# Patient Record
Sex: Female | Born: 1945
Health system: Southern US, Community
[De-identification: ages and names within clinical notes are randomized; demographics above are authoritative.]

## PROBLEM LIST (undated history)

## (undated) DIAGNOSIS — I10 Essential (primary) hypertension: Secondary | ICD-10-CM

## (undated) DIAGNOSIS — H269 Unspecified cataract: Secondary | ICD-10-CM

## (undated) DIAGNOSIS — Z860100 Personal history of colon polyps, unspecified: Secondary | ICD-10-CM

## (undated) DIAGNOSIS — M1990 Unspecified osteoarthritis, unspecified site: Secondary | ICD-10-CM

## (undated) DIAGNOSIS — T7840XA Allergy, unspecified, initial encounter: Secondary | ICD-10-CM

## (undated) DIAGNOSIS — K5790 Diverticulosis of intestine, part unspecified, without perforation or abscess without bleeding: Secondary | ICD-10-CM

## (undated) DIAGNOSIS — S0990XA Unspecified injury of head, initial encounter: Secondary | ICD-10-CM

## (undated) DIAGNOSIS — C801 Malignant (primary) neoplasm, unspecified: Secondary | ICD-10-CM

## (undated) DIAGNOSIS — G4719 Other hypersomnia: Secondary | ICD-10-CM

## (undated) DIAGNOSIS — E079 Disorder of thyroid, unspecified: Secondary | ICD-10-CM

## (undated) DIAGNOSIS — E785 Hyperlipidemia, unspecified: Secondary | ICD-10-CM

## (undated) DIAGNOSIS — K219 Gastro-esophageal reflux disease without esophagitis: Secondary | ICD-10-CM

## (undated) DIAGNOSIS — R51 Headache: Secondary | ICD-10-CM

## (undated) HISTORY — DX: Unspecified injury of head, initial encounter: S09.90XA

## (undated) HISTORY — DX: Gastro-esophageal reflux disease without esophagitis: K21.9

## (undated) HISTORY — DX: Headache: R51

## (undated) HISTORY — PX: CATARACT EXTRACTION, BILATERAL: SHX1313

## (undated) HISTORY — DX: Diverticulosis of intestine, part unspecified, without perforation or abscess without bleeding: K57.90

## (undated) HISTORY — PX: OTHER SURGICAL HISTORY: SHX169

## (undated) HISTORY — PX: AXILLARY ABCESS IRRIGATION AND DEBRIDEMENT: SHX1210

## (undated) HISTORY — DX: Allergy, unspecified, initial encounter: T78.40XA

## (undated) HISTORY — DX: Hyperlipidemia, unspecified: E78.5

## (undated) HISTORY — DX: Unspecified cataract: H26.9

## (undated) HISTORY — DX: Disorder of thyroid, unspecified: E07.9

## (undated) HISTORY — DX: Personal history of colon polyps, unspecified: Z86.0100

## (undated) HISTORY — PX: POLYPECTOMY: SHX149

## (undated) HISTORY — DX: Personal history of colonic polyps: Z86.010

## (undated) HISTORY — DX: Essential (primary) hypertension: I10

## (undated) HISTORY — DX: Other hypersomnia: G47.19

## (undated) HISTORY — DX: Malignant (primary) neoplasm, unspecified: C80.1

## (undated) HISTORY — DX: Unspecified osteoarthritis, unspecified site: M19.90

---

## 1969-09-15 HISTORY — PX: OTHER SURGICAL HISTORY: SHX169

## 1999-06-25 ENCOUNTER — Other Ambulatory Visit: Admission: RE | Admit: 1999-06-25 | Discharge: 1999-06-25 | Payer: Self-pay | Admitting: Internal Medicine

## 1999-12-12 ENCOUNTER — Other Ambulatory Visit: Admission: RE | Admit: 1999-12-12 | Discharge: 1999-12-12 | Payer: Self-pay | Admitting: Internal Medicine

## 2000-12-14 ENCOUNTER — Other Ambulatory Visit: Admission: RE | Admit: 2000-12-14 | Discharge: 2000-12-14 | Payer: Self-pay | Admitting: Internal Medicine

## 2001-12-30 ENCOUNTER — Other Ambulatory Visit: Admission: RE | Admit: 2001-12-30 | Discharge: 2001-12-30 | Payer: Self-pay | Admitting: Internal Medicine

## 2003-05-02 ENCOUNTER — Other Ambulatory Visit: Admission: RE | Admit: 2003-05-02 | Discharge: 2003-05-02 | Payer: Self-pay | Admitting: Internal Medicine

## 2004-06-21 ENCOUNTER — Other Ambulatory Visit: Admission: RE | Admit: 2004-06-21 | Discharge: 2004-06-21 | Payer: Self-pay | Admitting: Internal Medicine

## 2004-07-04 ENCOUNTER — Encounter: Admission: RE | Admit: 2004-07-04 | Discharge: 2004-07-04 | Payer: Self-pay | Admitting: General Surgery

## 2004-07-16 ENCOUNTER — Encounter: Admission: RE | Admit: 2004-07-16 | Discharge: 2004-07-16 | Payer: Self-pay | Admitting: Neurology

## 2004-07-25 ENCOUNTER — Ambulatory Visit: Payer: Self-pay | Admitting: Internal Medicine

## 2004-08-28 ENCOUNTER — Encounter: Admission: RE | Admit: 2004-08-28 | Discharge: 2004-08-28 | Payer: Self-pay | Admitting: Internal Medicine

## 2004-09-15 HISTORY — PX: BREAST BIOPSY: SHX20

## 2004-09-17 ENCOUNTER — Encounter: Admission: RE | Admit: 2004-09-17 | Discharge: 2004-09-17 | Payer: Self-pay | Admitting: Internal Medicine

## 2004-09-26 ENCOUNTER — Ambulatory Visit: Payer: Self-pay | Admitting: Internal Medicine

## 2004-11-21 ENCOUNTER — Ambulatory Visit: Payer: Self-pay | Admitting: Internal Medicine

## 2005-02-28 ENCOUNTER — Ambulatory Visit: Payer: Self-pay | Admitting: Internal Medicine

## 2005-03-20 ENCOUNTER — Ambulatory Visit: Payer: Self-pay | Admitting: Internal Medicine

## 2005-07-08 ENCOUNTER — Ambulatory Visit: Payer: Self-pay | Admitting: Internal Medicine

## 2005-07-08 ENCOUNTER — Other Ambulatory Visit: Admission: RE | Admit: 2005-07-08 | Discharge: 2005-07-08 | Payer: Self-pay | Admitting: Neurosurgery

## 2005-07-08 ENCOUNTER — Encounter: Payer: Self-pay | Admitting: Internal Medicine

## 2005-09-11 ENCOUNTER — Encounter: Admission: RE | Admit: 2005-09-11 | Discharge: 2005-09-11 | Payer: Self-pay | Admitting: Internal Medicine

## 2006-01-13 ENCOUNTER — Ambulatory Visit: Payer: Self-pay | Admitting: Internal Medicine

## 2006-06-24 ENCOUNTER — Ambulatory Visit: Payer: Self-pay | Admitting: Internal Medicine

## 2006-06-24 LAB — CONVERTED CEMR LAB
ALT: 38 units/L (ref 0–40)
AST: 25 units/L (ref 0–37)
Albumin: 3.6 g/dL (ref 3.5–5.2)
Alkaline Phosphatase: 43 units/L (ref 39–117)
BUN: 9 mg/dL (ref 6–23)
Basophils Absolute: 0.1 10*3/uL (ref 0.0–0.1)
Basophils Relative: 0.8 % (ref 0.0–1.0)
CO2: 26 meq/L (ref 19–32)
Calcium: 8.8 mg/dL (ref 8.4–10.5)
Chloride: 103 meq/L (ref 96–112)
Chol/HDL Ratio, serum: 5.2
Cholesterol: 170 mg/dL (ref 0–200)
Creatinine, Ser: 0.9 mg/dL (ref 0.4–1.2)
Eosinophil percent: 1.9 % (ref 0.0–5.0)
GFR calc non Af Amer: 68 mL/min
Glomerular Filtration Rate, Af Am: 83 mL/min/{1.73_m2}
Glucose, Bld: 75 mg/dL (ref 70–99)
HCT: 44.7 % (ref 36.0–46.0)
HDL: 33 mg/dL — ABNORMAL LOW (ref >39.0)
Hemoglobin: 14.9 g/dL (ref 12.0–15.0)
LDL Cholesterol: 114 mg/dL — ABNORMAL HIGH (ref 0–99)
Lymphocytes Relative: 33.9 % (ref 12.0–46.0)
MCHC: 33.3 g/dL (ref 30.0–36.0)
MCV: 94.4 fL (ref 78.0–100.0)
Monocytes Absolute: 0.5 10*3/uL (ref 0.2–0.7)
Monocytes Relative: 5.4 % (ref 3.0–11.0)
Neutro Abs: 5.1 10*3/uL (ref 1.4–7.7)
Neutrophils Relative %: 58 % (ref 43.0–77.0)
Platelets: 346 10*3/uL (ref 150–400)
Potassium: 3.6 meq/L (ref 3.5–5.1)
RBC: 4.73 M/uL (ref 3.87–5.11)
RDW: 12.9 % (ref 11.5–14.6)
Sodium: 138 meq/L (ref 135–145)
TSH: 3.62 microintl units/mL (ref 0.35–5.50)
Total Bilirubin: 0.9 mg/dL (ref 0.3–1.2)
Total Protein: 6.6 g/dL (ref 6.0–8.3)
Triglyceride fasting, serum: 116 mg/dL (ref 0–149)
VLDL: 23 mg/dL (ref 0–40)
WBC: 8.9 10*3/uL (ref 4.5–10.5)

## 2006-06-29 ENCOUNTER — Encounter: Payer: Self-pay | Admitting: Internal Medicine

## 2006-06-29 ENCOUNTER — Other Ambulatory Visit: Admission: RE | Admit: 2006-06-29 | Discharge: 2006-06-29 | Payer: Self-pay | Admitting: Internal Medicine

## 2006-06-29 ENCOUNTER — Ambulatory Visit: Payer: Self-pay | Admitting: Internal Medicine

## 2006-08-04 ENCOUNTER — Ambulatory Visit: Payer: Self-pay | Admitting: Gastroenterology

## 2006-08-17 ENCOUNTER — Encounter: Payer: Self-pay | Admitting: Internal Medicine

## 2006-08-17 ENCOUNTER — Ambulatory Visit: Payer: Self-pay | Admitting: Gastroenterology

## 2006-08-17 ENCOUNTER — Encounter (INDEPENDENT_AMBULATORY_CARE_PROVIDER_SITE_OTHER): Payer: Self-pay | Admitting: Specialist

## 2006-08-17 HISTORY — PX: COLONOSCOPY: SHX174

## 2006-08-31 ENCOUNTER — Ambulatory Visit: Payer: Self-pay | Admitting: Internal Medicine

## 2006-12-23 ENCOUNTER — Encounter: Admission: RE | Admit: 2006-12-23 | Discharge: 2006-12-23 | Payer: Self-pay | Admitting: Internal Medicine

## 2007-04-12 DIAGNOSIS — R51 Headache: Secondary | ICD-10-CM | POA: Insufficient documentation

## 2007-04-12 DIAGNOSIS — M1990 Unspecified osteoarthritis, unspecified site: Secondary | ICD-10-CM | POA: Insufficient documentation

## 2007-04-12 DIAGNOSIS — R519 Headache, unspecified: Secondary | ICD-10-CM | POA: Insufficient documentation

## 2007-04-12 DIAGNOSIS — E039 Hypothyroidism, unspecified: Secondary | ICD-10-CM | POA: Insufficient documentation

## 2007-05-03 ENCOUNTER — Ambulatory Visit: Payer: Self-pay | Admitting: Internal Medicine

## 2007-05-03 DIAGNOSIS — E785 Hyperlipidemia, unspecified: Secondary | ICD-10-CM | POA: Insufficient documentation

## 2007-05-03 DIAGNOSIS — I10 Essential (primary) hypertension: Secondary | ICD-10-CM | POA: Insufficient documentation

## 2007-08-30 ENCOUNTER — Telehealth: Payer: Self-pay | Admitting: Internal Medicine

## 2007-08-31 ENCOUNTER — Ambulatory Visit: Payer: Self-pay | Admitting: Internal Medicine

## 2007-09-27 ENCOUNTER — Other Ambulatory Visit: Admission: RE | Admit: 2007-09-27 | Discharge: 2007-09-27 | Payer: Self-pay | Admitting: Internal Medicine

## 2007-09-27 ENCOUNTER — Encounter: Payer: Self-pay | Admitting: Internal Medicine

## 2007-09-27 ENCOUNTER — Ambulatory Visit: Payer: Self-pay | Admitting: Internal Medicine

## 2007-09-27 DIAGNOSIS — K5730 Diverticulosis of large intestine without perforation or abscess without bleeding: Secondary | ICD-10-CM | POA: Insufficient documentation

## 2007-09-27 DIAGNOSIS — Z860100 Personal history of colon polyps, unspecified: Secondary | ICD-10-CM | POA: Insufficient documentation

## 2007-09-27 LAB — CONVERTED CEMR LAB
Cholesterol, target level: 200 mg/dL
HDL goal, serum: 40 mg/dL
LDL Goal: 100 mg/dL

## 2007-10-01 ENCOUNTER — Telehealth: Payer: Self-pay | Admitting: *Deleted

## 2007-12-22 ENCOUNTER — Telehealth: Payer: Self-pay | Admitting: *Deleted

## 2007-12-22 ENCOUNTER — Ambulatory Visit: Payer: Self-pay | Admitting: Internal Medicine

## 2007-12-22 LAB — CONVERTED CEMR LAB
Cholesterol: 178 mg/dL (ref 0–200)
HDL: 37.7 mg/dL — ABNORMAL LOW (ref >39.0)
LDL Cholesterol: 121 mg/dL — ABNORMAL HIGH (ref 0–99)
TSH: 1.82 microintl units/mL (ref 0.35–5.50)
Total CHOL/HDL Ratio: 4.7
Triglycerides: 95 mg/dL (ref 0–149)
VLDL: 19 mg/dL (ref 0–40)

## 2007-12-31 ENCOUNTER — Ambulatory Visit: Payer: Self-pay | Admitting: Internal Medicine

## 2008-02-10 ENCOUNTER — Encounter: Admission: RE | Admit: 2008-02-10 | Discharge: 2008-02-10 | Payer: Self-pay | Admitting: Internal Medicine

## 2008-04-05 ENCOUNTER — Ambulatory Visit: Payer: Self-pay | Admitting: Internal Medicine

## 2008-04-05 DIAGNOSIS — T887XXA Unspecified adverse effect of drug or medicament, initial encounter: Secondary | ICD-10-CM | POA: Insufficient documentation

## 2008-04-05 LAB — CONVERTED CEMR LAB
ALT: 53 units/L — ABNORMAL HIGH (ref 0–35)
AST: 36 units/L (ref 0–37)
Albumin: 3.6 g/dL (ref 3.5–5.2)
Alkaline Phosphatase: 48 units/L (ref 39–117)
BUN: 8 mg/dL (ref 6–23)
Bilirubin, Direct: 0.1 mg/dL (ref 0.0–0.3)
CO2: 27 meq/L (ref 19–32)
Calcium: 9 mg/dL (ref 8.4–10.5)
Chloride: 108 meq/L (ref 96–112)
Cholesterol: 156 mg/dL (ref 0–200)
Creatinine, Ser: 0.8 mg/dL (ref 0.4–1.2)
GFR calc Af Amer: 94 mL/min
GFR calc non Af Amer: 78 mL/min
Glucose, Bld: 90 mg/dL (ref 70–99)
HDL: 33.1 mg/dL — ABNORMAL LOW (ref >39.0)
LDL Cholesterol: 105 mg/dL — ABNORMAL HIGH (ref 0–99)
Potassium: 4.9 meq/L (ref 3.5–5.1)
Sodium: 140 meq/L (ref 135–145)
TSH: 1.77 microintl units/mL (ref 0.35–5.50)
Total Bilirubin: 0.8 mg/dL (ref 0.3–1.2)
Total CHOL/HDL Ratio: 4.7
Total Protein: 6.9 g/dL (ref 6.0–8.3)
Triglycerides: 92 mg/dL (ref 0–149)
VLDL: 18 mg/dL (ref 0–40)

## 2008-06-27 ENCOUNTER — Ambulatory Visit: Payer: Self-pay | Admitting: Internal Medicine

## 2008-11-16 ENCOUNTER — Telehealth: Payer: Self-pay | Admitting: Internal Medicine

## 2009-06-21 ENCOUNTER — Ambulatory Visit: Payer: Self-pay | Admitting: Internal Medicine

## 2009-08-14 ENCOUNTER — Encounter: Admission: RE | Admit: 2009-08-14 | Discharge: 2009-08-14 | Payer: Self-pay | Admitting: Internal Medicine

## 2010-03-20 ENCOUNTER — Ambulatory Visit: Payer: Self-pay | Admitting: Internal Medicine

## 2010-03-20 LAB — CONVERTED CEMR LAB
ALT: 33 units/L (ref 0–35)
AST: 27 units/L (ref 0–37)
Albumin: 4 g/dL (ref 3.5–5.2)
Alkaline Phosphatase: 57 units/L (ref 39–117)
BUN: 11 mg/dL (ref 6–23)
Basophils Absolute: 0 10*3/uL (ref 0.0–0.1)
Basophils Relative: 0.3 % (ref 0.0–3.0)
Bilirubin, Direct: 0.1 mg/dL (ref 0.0–0.3)
Blood in Urine, dipstick: NEGATIVE
CO2: 27 meq/L (ref 19–32)
Calcium: 9.1 mg/dL (ref 8.4–10.5)
Chloride: 108 meq/L (ref 96–112)
Cholesterol: 158 mg/dL (ref 0–200)
Creatinine, Ser: 0.9 mg/dL (ref 0.4–1.2)
Eosinophils Absolute: 0.1 10*3/uL (ref 0.0–0.7)
Eosinophils Relative: 1.4 % (ref 0.0–5.0)
GFR calc non Af Amer: 69.11 mL/min (ref >60)
Glucose, Bld: 81 mg/dL (ref 70–99)
Glucose, Urine, Semiquant: NEGATIVE
HCT: 43.5 % (ref 36.0–46.0)
HDL: 41 mg/dL (ref >39.00)
Hemoglobin: 15.1 g/dL — ABNORMAL HIGH (ref 12.0–15.0)
LDL Cholesterol: 99 mg/dL (ref 0–99)
Lymphocytes Relative: 26.9 % (ref 12.0–46.0)
Lymphs Abs: 2.4 10*3/uL (ref 0.7–4.0)
MCHC: 34.5 g/dL (ref 30.0–36.0)
MCV: 94 fL (ref 78.0–100.0)
Monocytes Absolute: 0.4 10*3/uL (ref 0.1–1.0)
Monocytes Relative: 5 % (ref 3.0–12.0)
Neutro Abs: 6 10*3/uL (ref 1.4–7.7)
Neutrophils Relative %: 66.4 % (ref 43.0–77.0)
Nitrite: NEGATIVE
Platelets: 301 10*3/uL (ref 150.0–400.0)
Potassium: 4.6 meq/L (ref 3.5–5.1)
RBC: 4.63 M/uL (ref 3.87–5.11)
RDW: 14.5 % (ref 11.5–14.6)
Sodium: 143 meq/L (ref 135–145)
Specific Gravity, Urine: 1.025
TSH: 2.18 microintl units/mL (ref 0.35–5.50)
Total Bilirubin: 0.6 mg/dL (ref 0.3–1.2)
Total CHOL/HDL Ratio: 4
Total Protein: 7 g/dL (ref 6.0–8.3)
Triglycerides: 92 mg/dL (ref 0.0–149.0)
Urobilinogen, UA: 1
VLDL: 18.4 mg/dL (ref 0.0–40.0)
WBC Urine, dipstick: NEGATIVE
WBC: 9 10*3/uL (ref 4.5–10.5)
pH: 7

## 2010-03-27 ENCOUNTER — Ambulatory Visit: Payer: Self-pay | Admitting: Internal Medicine

## 2010-03-27 DIAGNOSIS — IMO0002 Reserved for concepts with insufficient information to code with codable children: Secondary | ICD-10-CM | POA: Insufficient documentation

## 2010-04-02 ENCOUNTER — Ambulatory Visit: Payer: Self-pay | Admitting: Internal Medicine

## 2010-04-02 ENCOUNTER — Telehealth: Payer: Self-pay | Admitting: Internal Medicine

## 2010-04-09 ENCOUNTER — Encounter: Payer: Self-pay | Admitting: Internal Medicine

## 2010-04-09 ENCOUNTER — Encounter: Admission: RE | Admit: 2010-04-09 | Discharge: 2010-06-14 | Payer: Self-pay | Admitting: Internal Medicine

## 2010-06-03 ENCOUNTER — Telehealth: Payer: Self-pay | Admitting: Internal Medicine

## 2010-06-21 ENCOUNTER — Ambulatory Visit: Payer: Self-pay | Admitting: Internal Medicine

## 2010-06-21 DIAGNOSIS — T783XXA Angioneurotic edema, initial encounter: Secondary | ICD-10-CM | POA: Insufficient documentation

## 2010-06-26 LAB — CONVERTED CEMR LAB
Basophils Absolute: 0 10*3/uL (ref 0.0–0.1)
Basophils Relative: 0 % (ref 0–1)
Eosinophils Absolute: 0.1 10*3/uL (ref 0.0–0.7)
Eosinophils Relative: 1 % (ref 0–5)
HCT: 43 % (ref 36.0–46.0)
Hemoglobin: 14.4 g/dL (ref 12.0–15.0)
IgE (Immunoglobulin E), Serum: 76.3 intl units/mL (ref 0.0–180.0)
Lymphocytes Relative: 25 % (ref 12–46)
Lymphs Abs: 2.6 10*3/uL (ref 0.7–4.0)
MCHC: 33.5 g/dL (ref 30.0–36.0)
MCV: 92.5 fL (ref 78.0–100.0)
Monocytes Absolute: 0.6 10*3/uL (ref 0.1–1.0)
Monocytes Relative: 6 % (ref 3–12)
Neutro Abs: 7 10*3/uL (ref 1.7–7.7)
Neutrophils Relative %: 68 % (ref 43–77)
Platelets: 342 10*3/uL (ref 150–400)
RBC: 4.65 M/uL (ref 3.87–5.11)
RDW: 13.8 % (ref 11.5–15.5)
WBC: 10.3 10*3/uL (ref 4.0–10.5)

## 2010-07-31 ENCOUNTER — Ambulatory Visit: Payer: Self-pay | Admitting: Internal Medicine

## 2010-07-31 LAB — CONVERTED CEMR LAB
BUN: 14 mg/dL (ref 6–23)
CO2: 26 meq/L (ref 19–32)
Calcium: 8.9 mg/dL (ref 8.4–10.5)
Chloride: 103 meq/L (ref 96–112)
Cholesterol: 184 mg/dL (ref 0–200)
Creatinine, Ser: 0.8 mg/dL (ref 0.4–1.2)
Direct LDL: 128 mg/dL
GFR calc non Af Amer: 81.75 mL/min (ref >60)
Glucose, Bld: 83 mg/dL (ref 70–99)
HDL: 43.1 mg/dL (ref >39.00)
IgE (Immunoglobulin E), Serum: 77.1 intl units/mL (ref 0.0–180.0)
Potassium: 4.1 meq/L (ref 3.5–5.1)
Sodium: 138 meq/L (ref 135–145)
TSH: 2.63 microintl units/mL (ref 0.35–5.50)

## 2010-08-12 ENCOUNTER — Telehealth: Payer: Self-pay | Admitting: Internal Medicine

## 2010-08-15 ENCOUNTER — Encounter: Admission: RE | Admit: 2010-08-15 | Discharge: 2010-08-15 | Payer: Self-pay | Admitting: Internal Medicine

## 2010-08-21 ENCOUNTER — Ambulatory Visit: Payer: Self-pay | Admitting: Internal Medicine

## 2010-08-21 ENCOUNTER — Encounter: Payer: Self-pay | Admitting: Internal Medicine

## 2010-08-21 LAB — CONVERTED CEMR LAB
Compl, Total (CH50): 40 (ref 31–60)
Complement C4, Body Fluid: 36 mg/dL (ref 16–47)
Sed Rate: 6 mm/hr (ref 0–22)

## 2010-09-03 ENCOUNTER — Ambulatory Visit: Payer: Self-pay | Admitting: Internal Medicine

## 2010-09-03 DIAGNOSIS — K219 Gastro-esophageal reflux disease without esophagitis: Secondary | ICD-10-CM | POA: Insufficient documentation

## 2010-09-20 ENCOUNTER — Ambulatory Visit: Admit: 2010-09-20 | Payer: Self-pay | Admitting: Internal Medicine

## 2010-09-27 ENCOUNTER — Ambulatory Visit: Admit: 2010-09-27 | Payer: Self-pay | Admitting: Internal Medicine

## 2010-10-05 ENCOUNTER — Encounter: Payer: Self-pay | Admitting: Internal Medicine

## 2010-10-06 ENCOUNTER — Encounter: Payer: Self-pay | Admitting: Internal Medicine

## 2010-10-15 NOTE — Miscellaneous (Signed)
Summary: Waiver of Liability for [employer]  Waiver of Liability for [employer]   Imported By: Maryln Gottron 02/10/2008 12:34:14  _____________________________________________________________________  External Attachment:    Type:   Image     Comment:   External Document

## 2010-10-15 NOTE — Progress Notes (Signed)
Summary: referral  Phone Note Call from Patient Call back at Home Phone [phone]   Caller: Patient Call For: Stacie Glaze MD Summary of Call: Pt is complaining of allergies, and wantsr to make an appt with allergist before the first of the year.   Initial call taken by: Lynann Beaver CMA AAMA,  August 12, 2010 12:12 PM  Follow-up for Phone Call        referral to dr Maple Hudson  if not possible dr Edenborn Callas Follow-up by: Stacie Glaze MD,  August 12, 2010 1:25 PM

## 2010-10-15 NOTE — Assessment & Plan Note (Signed)
Summary: flu shot/njr  Nurse Visit   Allergies: 1)  ! Daypro 2)  ! Lamisil At (Terbinafine Hcl)  Orders Added: 1)  Admin 1st Vaccine [90471] 2)  Flu Vaccine 59yrs + [42706] Flu Vaccine Consent Questions     Do you have a history of severe allergic reactions to this vaccine? no    Any prior history of allergic reactions to egg and/or gelatin? no    Do you have a sensitivity to the preservative Thimersol? no    Do you have a past history of Guillan-Barre Syndrome? no    Do you currently have an acute febrile illness? no    Have you ever had a severe reaction to latex? no    Vaccine information given and explained to patient? yes    Are you currently pregnant? no    Lot Number:[id number]   Exp Date:03/14/2010   Site Given  Right Deltoid IMu Vaccine 4yrs + [id number] .lbflu

## 2010-10-15 NOTE — Progress Notes (Signed)
Summary: results  Phone Note From Pharmacy Call back at [facility] Phone [phone] Call back at [phone]   Caller: vm Reason for Call: Lab or Test Results Summary of Call: Xrays & whether MRI or ov needed Initial call taken by: Rudy Jew, RN,  April 02, 2010 3:42 PM  Follow-up for Phone Call        scoliosis and moderate arthritis refer to PT Follow-up by: Stacie Glaze MD,  April 03, 2010 12:24 PM  Additional Follow-up for Phone Call Additional follow up Details #1::        Left message to check H# for message & left complete message there.   Additional Follow-up by: Rudy Jew, RN,  April 03, 2010 1:12 PM    Prescriptions: LIPITOR 10 MG  TABS (ATORVASTATIN CALCIUM) 1/2 tablet once daily  #50 x 0   Entered by:   Willy Eddy, LPN   Authorized by:   Stacie Glaze MD   Signed by:   Willy Eddy, LPN on 69/62/9528   Method used:   Telephoned to ...         RxID:   [id number]

## 2010-10-15 NOTE — Assessment & Plan Note (Signed)
Summary: CPX WITH PAP/MHF   Vital Signs:  Patient Profile:   65 Years Old Female Height:     65 inches Weight:      177 pounds Temp:     97.6 degrees F oral Pulse rate:   78 / minute Pulse rhythm:   regular Resp:     16 per minute BP sitting:   142 / 82  Vitals Entered By: Lynann Beaver CMA (September 27, 2007 2:10 PM)                 Chief Complaint:  well woman physical.  History of Present Illness: this is a pap smear and a follow up of HTN, lipids and migraine headaches   Hypertension History:      She denies headache, chest pain, palpitations, dyspnea with exertion, orthopnea, PND, peripheral edema, visual symptoms, neurologic problems, syncope, and side effects from treatment.        Positive major cardiovascular risk factors include female age 32 years old or older, hyperlipidemia, and hypertension.  Negative major cardiovascular risk factors include no history of diabetes, negative family history for ischemic heart disease, and non-tobacco-user status.        Further assessment for target organ damage reveals no history of ASHD, stroke/TIA, or peripheral vascular disease.    Lipid Management History:      Positive NCEP/ATP III risk factors include female age 9 years old or older, HDL cholesterol less than 40, and hypertension.  Negative NCEP/ATP III risk factors include non-diabetic, no family history for ischemic heart disease, non-tobacco-user status, no ASHD (atherosclerotic heart disease), no prior stroke/TIA, no peripheral vascular disease, and no history of aortic aneurysm.      Current Allergies (reviewed today): ! DAYPRO ! LAMISIL AT (TERBINAFINE HCL) Updated/Current Medications (including changes made in today's visit):  LEVOTHYROXINE SODIUM 75 MCG  TABS (LEVOTHYROXINE SODIUM) Take 1 tablet by mouth once a day LIPITOR 10 MG  TABS (ATORVASTATIN CALCIUM) 1/2 tablet once daily ACIPHEX 20 MG  TBEC (RABEPRAZOLE SODIUM) onepo daily ALTACE 5 MG  CAPS (RAMIPRIL)  Take 1 tablet by mouth once a day ZYRTEC ALLERGY 10 MG  TABS (CETIRIZINE HCL) as needed * CALCIUM once daily * MULTIVITAMIN once daily RELPAX 40 MG  TABS (ELETRIPTAN HYDROBROMIDE) one by mouth as needed migraine HA LYBREL 90-20 MCG  TABS (LEVONORGESTREL-ETHINYL ESTRAD) one daily for 28 days and begin new pack   Past Medical History:    Reviewed history from 05/03/2007 and no changes required:       Headache       Hypothyroidism       Osteoarthritis       Hypertension       Hyperlipidemia       Colonic polyps, hx of       Diverticulosis, colon  Past Surgical History:    Reviewed history from 05/03/2007 and no changes required:       Colonoscopy-08/17/2006       BCCA from eyelid   Family History:    Reviewed history from 05/03/2007 and no changes required:       father died fro liver dz       Family History of Alcoholism/Addiction       mother lung cancer 66       Family History Lung cancer  Social History:    Reviewed history from 05/03/2007 and no changes required:       Never Smoked       Single   Risk Factors:  Colonoscopy History:     Date of Last Colonoscopy:  08/17/2006    Results:  Abnormal    Review of Systems  The patient denies anorexia, fever, weight loss, vision loss, decreased hearing, hoarseness, chest pain, syncope, dyspnea on exhertion, peripheral edema, prolonged cough, hemoptysis, abdominal pain, melena, hematochezia, severe indigestion/heartburn, hematuria, incontinence, genital sores, muscle weakness, suspicious skin lesions, transient blindness, difficulty walking, depression, unusual weight change, abnormal bleeding, enlarged lymph nodes, angioedema, and breast masses.     Physical Exam  General:     Well-developed,well-nourished,in no acute distress; alert,appropriate and cooperative throughout examination Head:     Normocephalic and atraumatic without obvious abnormalities. No apparent alopecia or balding. Eyes:     No corneal or  conjunctival inflammation noted. EOMI. Perrla. Funduscopic exam benign, without hemorrhages, exudates or papilledema. Vision grossly normal. Nose:     External nasal examination shows no deformity or inflammation. Nasal mucosa are pink and moist without lesions or exudates. Mouth:     Oral mucosa and oropharynx without lesions or exudates.  Teeth in good repair. Neck:     No deformities, masses, or tenderness noted. Breasts:     skin/areolae normal, no masses, and no abnormal thickening.   Lungs:     Normal respiratory effort, chest expands symmetrically. Lungs are clear to auscultation, no crackles or wheezes. Heart:     Normal rate and regular rhythm. S1 and S2 normal without gallop, murmur, click, rub or other extra sounds. Abdomen:     Bowel sounds positive,abdomen soft and non-tender without masses, organomegaly or hernias noted. Rectal:     no external abnormalities.   Genitalia:     normal introitus, no vaginal atrophy, and no friaility or hemorrhage.   Msk:     No deformity or scoliosis noted of thoracic or lumbar spine.   Pulses:     R and L carotid,radial,femoral,dorsalis pedis and posterior tibial pulses are full and equal bilaterally Extremities:     No clubbing, cyanosis, edema, or deformity noted with normal full range of motion of all joints.   Neurologic:     No cranial nerve deficits noted. Station and gait are normal. Plantar reflexes are down-going bilaterally. DTRs are symmetrical throughout. Sensory, motor and coordinative functions appear intact. Skin:     Intact without suspicious lesions or rashes Cervical Nodes:     No lymphadenopathy noted Axillary Nodes:     No palpable lymphadenopathy Psych:     Oriented X3 and memory intact for recent and remote.      Impression & Recommendations:  Problem # 1:  HYPERLIPIDEMIA (ICD-272.4) Assessment: Unchanged  Her updated medication list for this problem includes:    Lipitor 10 Mg Tabs (Atorvastatin calcium)  .Marland Kitchen... 1/2 tablet once daily  Labs Reviewed: Chol: 170 (06/24/2006)   HDL: 33.0 (06/24/2006)   LDL: 114 CALC (06/24/2006)   TG: 116 (06/24/2006) SGOT: 25 (06/24/2006)   SGPT: 38 (06/24/2006)  Lipid Goals: Chol Goal: 200 (09/27/2007)   HDL Goal: 40 (09/27/2007)   LDL Goal: 100 (09/27/2007)   TG Goal: 150 (09/27/2007)  10 Yr Risk Heart Disease: 24 %   Problem # 2:  HYPERTENSION (ICD-401.9) Assessment: Unchanged  Her updated medication list for this problem includes:    Altace 5 Mg Caps (Ramipril) .Marland Kitchen... Take 1 tablet by mouth once a day  BP today: 142/82 Prior BP: 136/80 (05/03/2007)  10 Yr Risk Heart Disease: 24 %  Labs Reviewed: Creat: 0.9 (06/24/2006) Chol: 170 (06/24/2006)   HDL: 33.0 (06/24/2006)  LDL: 114 CALC (06/24/2006)   TG: 116 (06/24/2006)   Problem # 3:  OSTEOARTHRITIS (ICD-715.90) Assessment: Unchanged Discussed use of medications, application of heat or cold, and exercises.   Problem # 4:  HYPOTHYROIDISM (ICD-244.9)  Her updated medication list for this problem includes:    Levothyroxine Sodium 75 Mcg Tabs (Levothyroxine sodium) .Marland Kitchen... Take 1 tablet by mouth once a day  Labs Reviewed: TSH: 3.62 (06/24/2006)    Chol: 170 (06/24/2006)   HDL: 33.0 (06/24/2006)   LDL: 114 CALC (06/24/2006)   TG: 116 (06/24/2006)   Complete Medication List: 1)  Levothyroxine Sodium 75 Mcg Tabs (Levothyroxine sodium) .... Take 1 tablet by mouth once a day 2)  Lipitor 10 Mg Tabs (Atorvastatin calcium) .... 1/2 tablet once daily 3)  Aciphex 20 Mg Tbec (Rabeprazole sodium) .... Onepo daily 4)  Altace 5 Mg Caps (Ramipril) .... Take 1 tablet by mouth once a day 5)  Zyrtec Allergy 10 Mg Tabs (Cetirizine hcl) .... As needed 6)  Calcium  .... Once daily 7)  Multivitamin  .... Once daily 8)  Relpax 40 Mg Tabs (Eletriptan hydrobromide) .... One by mouth as needed migraine ha 9)  Lybrel 90-20 Mcg Tabs (Levonorgestrel-ethinyl estrad) .... One daily for 28 days and begin new  pack  Hypertension Assessment/Plan:      The patient's hypertensive risk group is category B: At least one risk factor (excluding diabetes) with no target organ damage.  Her calculated 10 year risk of coronary heart disease is 24 %.  Today's blood pressure is 142/82.  Her blood pressure goal is < 140/90.  Lipid Assessment/Plan:      Based on NCEP/ATP III, the patient's risk factor category is "2 or more risk factors and a calculated 10 year CAD risk of > 20%".  From this information, the patient's calculated lipid goals are as follows: Total cholesterol goal is 200; LDL cholesterol goal is 100; HDL cholesterol goal is 40; Triglyceride goal is 150.     Patient Instructions: 1)  Please schedule a follow-up appointment in 3 months. 2)  Lipid Panel prior to visit, ICD-9: 272.4 3)  TSH prior to visit, ICD-9:  244.9 4)  Hepatic Panel prior to visit, ICD-9:  995.20    Prescriptions: LYBREL 90-20 MCG  TABS (LEVONORGESTREL-ETHINYL ESTRAD) one daily for 28 days and begin new pack  #3 x 3   Entered and Authorized by:   Stacie Glaze MD   Signed by:   Stacie Glaze MD on 09/27/2007   Method used:   Print then Give to Patient   RxID:   [id number] ALTACE 5 MG  CAPS (RAMIPRIL) Take 1 tablet by mouth once a day  #90 x 3   Entered and Authorized by:   Stacie Glaze MD   Signed by:   Stacie Glaze MD on 09/27/2007   Method used:   Print then Give to Patient   RxID:   [id number]  ]

## 2010-10-15 NOTE — Progress Notes (Signed)
Summary: Mail Order Rx Request  Phone Note Call from Patient Call back at Home Phone [phone]   Caller: Patient Call For: Lovell Sheehan Reason for Call: Refill Medication Summary of Call: Patient needs a mail order prescription for Levothyroxine 75 micrograms 90 day prescription. [facility] is the pharmacy Initial call taken by: Barnie Mort,  December 22, 2007 9:34 AM      Prescriptions: LEVOTHYROXINE SODIUM 75 MCG  TABS (LEVOTHYROXINE SODIUM) Take 1 tablet by mouth once a day  #90 x 3   Entered by:   Willy Eddy, LPN   Authorized by:   Stacie Glaze MD   Signed by:   Willy Eddy, LPN on 09/81/1914   Method used:   Print then Give to Patient   RxID:   [id number]

## 2010-10-15 NOTE — Assessment & Plan Note (Signed)
Summary: multiple episodes of allergic reactions/dm   Vital Signs:  Patient profile:   65 year old female Height:      65 inches Weight:      172 pounds BMI:     28.73 Temp:     98.2 degrees F oral Pulse rate:   76 / minute Resp:     14 per minute BP sitting:   122 / 80  (left arm)  Vitals Entered By: Willy Eddy, LPN (June 21, 2010 4:10 PM) CC: c/o several episodes of allergic reactions with swollen lips and tongue-unknown cause--reloeved with benadryl Is Patient Diabetic? No   Primary Care Provider:  Stacie Glaze MD  CC:  c/o several episodes of allergic reactions with swollen lips and tongue-unknown cause--reloeved with benadryl.  History of Present Illness: flu shot given has been having episodic swelling in joints that responds to heat ( warm water on joints) has noted togue swelling in the night has noted this  happening more often lips swollen sometime unilateral palmar erythema off an on for a while  but more often recently has not changes diet of makeup or facial no new pets did not respond to benadryl  Preventive Screening-Counseling & Management  Alcohol-Tobacco     Smoking Status: never     Passive Smoke Exposure: no  Problems Prior to Update: 1)  Preventive Health Care  (ICD-V70.0) 2)  Lumbar Radiculopathy, Right  (ICD-724.4) 3)  Uns Advrs Eff Uns Rx Medicinal&biological Sbstnc  (ICD-995.20) 4)  Diverticulosis, Colon  (ICD-562.10) 5)  Colonic Polyps, Hx of  (ICD-V12.72) 6)  Family History of Alcoholism/addiction  (ICD-V61.41) 7)  Hyperlipidemia  (ICD-272.4) 8)  Hypertension  (ICD-401.9) 9)  Osteoarthritis  (ICD-715.90) 10)  Hypothyroidism  (ICD-244.9) 11)  Headache  (ICD-784.0)  Current Problems (verified): 1)  Preventive Health Care  (ICD-V70.0) 2)  Lumbar Radiculopathy, Right  (ICD-724.4) 3)  Uns Advrs Eff Uns Rx Medicinal&biological Sbstnc  (ICD-995.20) 4)  Diverticulosis, Colon  (ICD-562.10) 5)  Colonic Polyps, Hx of   (ICD-V12.72) 6)  Family History of Alcoholism/addiction  (ICD-V61.41) 7)  Hyperlipidemia  (ICD-272.4) 8)  Hypertension  (ICD-401.9) 9)  Osteoarthritis  (ICD-715.90) 10)  Hypothyroidism  (ICD-244.9) 11)  Headache  (ICD-784.0)  Medications Prior to Update: 1)  Levothyroxine Sodium 75 Mcg  Tabs (Levothyroxine Sodium) .... Take 1 Tablet By Mouth Once A Day 2)  Lipitor 10 Mg  Tabs (Atorvastatin Calcium) .... 1/2 Tablet Once Daily 3)  Aciphex 20 Mg  Tbec (Rabeprazole Sodium) .... Onepo Daily 4)  Altace 5 Mg  Caps (Ramipril) .... Take 1 Tablet By Mouth Once A Day 5)  Calcium .... Once Daily 6)  Multivitamin .... Once Daily 7)  Relpax 40 Mg  Tabs (Eletriptan Hydrobromide) .... One By Mouth As Needed Migraine Ha 8)  Lybrel 90-20 Mcg  Tabs (Levonorgestrel-Ethinyl Estrad) .... One Daily For 28 Days and Begin New Pack 9)  Advil 200 Mg Tabs (Ibuprofen) .... As Needed 10)  Maalox Plus 225-200-25 Mg/1ml Susp (Alum & Mag Hydroxide-Simeth) .... As Needed  Current Medications (verified): 1)  Levothyroxine Sodium 75 Mcg  Tabs (Levothyroxine Sodium) .... Take 1 Tablet By Mouth Once A Day 2)  Lipitor 10 Mg  Tabs (Atorvastatin Calcium) .... 1/2 Tablet Once Daily 3)  Aciphex 20 Mg  Tbec (Rabeprazole Sodium) .... Onepo Daily 4)  Altace 5 Mg  Caps (Ramipril) .... Take 1 Tablet By Mouth Once A Day 5)  Calcium .... Once Daily 6)  Multivitamin .... Once Daily 7)  Relpax 40 Mg  Tabs (Eletriptan Hydrobromide) .... One By Mouth As Needed Migraine Ha 8)  Lybrel 90-20 Mcg  Tabs (Levonorgestrel-Ethinyl Estrad) .... One Daily For 28 Days and Begin New Pack 9)  Advil 200 Mg Tabs (Ibuprofen) .... As Needed 10)  Maalox Plus 225-200-25 Mg/53ml Susp (Alum & Mag Hydroxide-Simeth) .... As Needed 11)  Benadryl 25 Mg Tabs (Diphenhydramine Hcl) .Marland Kitchen.. 1 Once Daily As Needed 12)  Prednisone 20 Mg Tabs (Prednisone) .... Take Two When The Swelling Occurs  Allergies (verified): 1)  ! Daypro 2)  ! Lamisil At (Terbinafine  Hcl)  Past History:  Family History: Last updated: 05/21/07 father died fro liver dz Family History of Alcoholism/Addiction mother lung cancer 27 Family History Lung cancer  Social History: Last updated: 2007-05-21 Never Smoked Single  Risk Factors: Caffeine Use: 1 (2007-05-21) Exercise: no (05/21/2007)  Risk Factors: Smoking Status: never (06/21/2010) Passive Smoke Exposure: no (06/21/2010)  Past medical, surgical, family and social histories (including risk factors) reviewed, and no changes noted (except as noted below).  Past Medical History: Reviewed history from 09/27/2007 and no changes required. Headache Hypothyroidism Osteoarthritis Hypertension Hyperlipidemia Colonic polyps, hx of Diverticulosis, colon  Past Surgical History: Reviewed history from 2007/05/21 and no changes required. Colonoscopy-08/17/2006 BCCA from eyelid  Family History: Reviewed history from 05-21-2007 and no changes required. father died fro liver dz Family History of Alcoholism/Addiction mother lung cancer 24 Family History Lung cancer  Social History: Reviewed history from 05/21/07 and no changes required. Never Smoked Single  Review of Systems  The patient denies anorexia, fever, weight loss, weight gain, vision loss, decreased hearing, hoarseness, chest pain, syncope, dyspnea on exertion, peripheral edema, prolonged cough, headaches, hemoptysis, abdominal pain, melena, hematochezia, severe indigestion/heartburn, hematuria, incontinence, genital sores, muscle weakness, suspicious skin lesions, transient blindness, difficulty walking, depression, unusual weight change, abnormal bleeding, enlarged lymph nodes, angioedema, and breast masses.    Physical Exam  General:  Well-developed,well-nourished,in no acute distress; alert,appropriate and cooperative throughout examination Head:  Normocephalic and atraumatic without obvious abnormalities. No apparent alopecia or  balding. Eyes:  No corneal or conjunctival inflammation noted. EOMI. Perrla. Funduscopic exam benign, without hemorrhages, exudates or papilledema. Vision grossly normal. Ears:  R ear normal and L ear normal.   Nose:  no external deformity and no nasal discharge.   Mouth:  good dentition and pharynx pink and moist.   Neck:  No deformities, masses, or tenderness noted. Lungs:  Normal respiratory effort, chest expands symmetrically. Lungs are clear to auscultation, no crackles or wheezes. Heart:  Normal rate and regular rhythm. S1 and S2 normal without gallop, murmur, click, rub or other extra sounds. Msk:  No deformity or scoliosis noted of thoracic or lumbar spine.   Pulses:  R and L carotid,radial,femoral,dorsalis pedis and posterior tibial pulses are full and equal bilaterally Extremities:  No clubbing, cyanosis, edema, or deformity noted with normal full range of motion of all joints.   Neurologic:  No cranial nerve deficits noted. Station and gait are normal. Plantar reflexes are down-going bilaterally. DTRs are symmetrical throughout. Sensory, motor and coordinative functions appear intact.   Impression & Recommendations:  Problem # 1:  ANGIOEDEMA (ICD-995.1) Assessment Unchanged  allergic vx heriditary allergy panel and complement panel   Mammogram: ASSESSMENT: Negative - BI-RADS 1^MM DIGITAL SCREENING (08/14/2009) Colonoscopy: Abnormal (08/17/2006) Td Booster: Historical (09/15/2005)   Flu Vax: Fluvax 3+ (06/21/2009)   Chol: 158 (03/20/2010)   HDL: 41.00 (03/20/2010)   LDL: 99 (03/20/2010)   TG: 92.0 (03/20/2010) TSH: 2.18 (  03/20/2010)    Discussed using sunscreen, use of alcohol, drug use, self breast exam, routine dental care, routine eye care, schedule for GYN exam, routine physical exam, seat belts, multiple vitamins, osteoporosis prevention, adequate calcium intake in diet, recommendations for immunizations, mammograms and Pap smears.  Discussed exercise and checking  cholesterol.  Discussed gun safety, safe sex, and contraception.  Orders: Venipuncture ([address]) TLB-CBC Platelet - w/Differential (85025-CBCD) T-Allergy Profile [place], [place], [place], [place], [place] [phone]) T-Food Allergy Profile Specific IgE ([id number]) T- * Misc. Laboratory test [phone])  Problem # 2:  HYPERTENSION (ICD-401.9) Assessment: Unchanged  Her updated medication list for this problem includes:    Altace 5 Mg Caps (Ramipril) .Marland Kitchen... Take 1 tablet by mouth once a day  BP today: 122/80 Prior BP: 130/80 (03/27/2010)  Prior 10 Yr Risk Heart Disease: 13 % (12/31/2007)  Labs Reviewed: K+: 4.6 (03/20/2010) Creat: : 0.9 (03/20/2010)   Chol: 158 (03/20/2010)   HDL: 41.00 (03/20/2010)   LDL: 99 (03/20/2010)   TG: 92.0 (03/20/2010)  Complete Medication List: 1)  Levothyroxine Sodium 75 Mcg Tabs (Levothyroxine sodium) .... Take 1 tablet by mouth once a day 2)  Lipitor 10 Mg Tabs (Atorvastatin calcium) .... 1/2 tablet once daily 3)  Aciphex 20 Mg Tbec (Rabeprazole sodium) .... Onepo daily 4)  Altace 5 Mg Caps (Ramipril) .... Take 1 tablet by mouth once a day 5)  Calcium  .... Once daily 6)  Multivitamin  .... Once daily 7)  Relpax 40 Mg Tabs (Eletriptan hydrobromide) .... One by mouth as needed migraine ha 8)  Lybrel 90-20 Mcg Tabs (Levonorgestrel-ethinyl estrad) .... One daily for 28 days and begin new pack 9)  Advil 200 Mg Tabs (Ibuprofen) .... As needed 10)  Maalox Plus 225-200-25 Mg/35ml Susp (Alum & mag hydroxide-simeth) .... As needed 11)  Benadryl 25 Mg Tabs (Diphenhydramine hcl) .Marland Kitchen.. 1 once daily as needed 12)  Prednisone 20 Mg Tabs (Prednisone) .... Take two when the swelling occurs  Patient Instructions: 1)  Please schedule a follow-up appointment in 1 month. Prescriptions: PREDNISONE 20 MG TABS (PREDNISONE) take two when the swelling occurs  #20 x 0   Entered and Authorized by:   Stacie Glaze MD   Signed by:   Stacie Glaze MD on 06/21/2010   Method used:    Electronically to        [facility] (retail)       [address]       [place], [place]  [address]       Ph: [phone] or [phone]       Fax: [phone]   RxID:   [id number]   Appended Document: Orders Update    Clinical Lists Changes  Orders: Added new Service order of Admin 1st Vaccine (25956) - Signed Added new Service order of Flu Vaccine 14yrs + [phone]) - Signed Observations: Added new observation of ROS: Flu Vaccine Consent Questions     Do you have a history of severe allergic reactions to this vaccine? no    Any prior history of allergic reactions to egg and/or gelatin? no    Do you have a sensitivity to the preservative Thimersol? no    Do you have a past history of Guillan-Barre Syndrome? no    Do you currently have an acute febrile illness? no    Have you ever had a severe reaction to latex? no    Vaccine information given  and explained to patient? yes    Are you currently pregnant? no    Lot Number:[id number]   Exp Date:03/14/2010   Site Given  Left Deltoid IM  (07/31/2010 13:41) Added new observation of FLU VAX VIS: 04/24/09 version (07/31/2010 13:41) Added new observation of FLU VAXLOT: [id number] (07/31/2010 13:41) Added new observation of FLU VAXMFR: [employer] (07/31/2010 13:41) Added new observation of FLU VAX EXP: 03/14/2010 (07/31/2010 13:41) Added new observation of FLU VAX DSE: 0.71ml (07/31/2010 13:41) Added new observation of FLU VAX: Fluvax 3+ (07/31/2010 13:41)        Review of Systems       Flu Vaccine Consent Questions     Do you have a history of severe allergic reactions to this vaccine? no    Any prior history of allergic reactions to egg and/or gelatin? no    Do you have a sensitivity to the preservative Thimersol? no    Do you have a past history of Guillan-Barre Syndrome? no    Do you currently have an acute febrile illness? no    Have you ever had a severe reaction to latex? no     Vaccine information given and explained to patient? yes    Are you currently pregnant? no    Lot Number:[id number]   Exp Date:03/14/2010   Site Given  Left Deltoid IM

## 2010-10-15 NOTE — Progress Notes (Signed)
Summary: RX & flu shot  Phone Note Call from Patient   Call For: kwia Summary of Call: 1) Request mailorder RX for Relpax 40mg  90 day supply to pickup tomorrow. 2) Can I get flu shot tomorrow when I pickup RX? [phone] or [phone]. Initial call taken by: Rudy Jew, RN,  August 30, 2007 11:28 AM  Follow-up for Phone Call        yes to both Follow-up by: Stacie Glaze MD,  August 30, 2007 1:09 PM      Prescriptions: RELPAX 40 MG  TABS (ELETRIPTAN HYDROBROMIDE) one by mouth as needed migraine HA  #30 x 0   Entered by:   Lynann Beaver CMA   Authorized by:   Stacie Glaze MD   Signed by:   Lynann Beaver CMA on 08/30/2007   Method used:   Print then Give to Patient   RxID:   [id number]  Pt notified to pick up pres and have flu shot tomorrow.

## 2010-10-15 NOTE — Miscellaneous (Signed)
Summary: Initial Summary for PT Services/[facility]  Initial Summary for PT Services/[facility]   Imported By: Maryln Gottron 04/11/2010 12:52:31  _____________________________________________________________________  External Attachment:    Type:   Image     Comment:   External Document

## 2010-10-15 NOTE — Assessment & Plan Note (Signed)
Summary: cpx/no pap./njr   Vital Signs:  Patient profile:   65 year old female Height:      65 inches Weight:      174 pounds BMI:     29.06 Temp:     98.2 degrees F oral Pulse rate:   76 / minute Resp:     14 per minute BP sitting:   130 / 80  (left arm)  Vitals Entered By: Willy Eddy, LPN (March 27, 2010 3:28 PM) CC: cpx- states she is going to make appointment with gyn for gyn work up   CC:  cpx- states she is going to make appointment with gyn for gyn work up.  History of Present Illness: revew of labs The pt was asked about all immunizations, health maint. services that are appropriate to their age and was given guidance on diet exercize  and weight management report of acute episode of low back pain that begain as a catch and radiated to the thigh now central to right si region   Preventive Screening-Counseling & Management  Alcohol-Tobacco     Smoking Status: never     Passive Smoke Exposure: no  Problems Prior to Update: 1)  Uns Advrs Eff Uns Rx Medicinal&biological Sbstnc  (ICD-995.20) 2)  Diverticulosis, Colon  (ICD-562.10) 3)  Colonic Polyps, Hx of  (ICD-V12.72) 4)  Family History of Alcoholism/addiction  (ICD-V61.41) 5)  Hyperlipidemia  (ICD-272.4) 6)  Hypertension  (ICD-401.9) 7)  Osteoarthritis  (ICD-715.90) 8)  Hypothyroidism  (ICD-244.9) 9)  Headache  (ICD-784.0)  Current Problems (verified): 1)  Uns Advrs Eff Uns Rx Medicinal&biological Sbstnc  (ICD-995.20) 2)  Diverticulosis, Colon  (ICD-562.10) 3)  Colonic Polyps, Hx of  (ICD-V12.72) 4)  Family History of Alcoholism/addiction  (ICD-V61.41) 5)  Hyperlipidemia  (ICD-272.4) 6)  Hypertension  (ICD-401.9) 7)  Osteoarthritis  (ICD-715.90) 8)  Hypothyroidism  (ICD-244.9) 9)  Headache  (ICD-784.0)  Medications Prior to Update: 1)  Levothyroxine Sodium 75 Mcg  Tabs (Levothyroxine Sodium) .... Take 1 Tablet By Mouth Once A Day 2)  Lipitor 10 Mg  Tabs (Atorvastatin Calcium) .... 1/2 Tablet Once  Daily 3)  Aciphex 20 Mg  Tbec (Rabeprazole Sodium) .... Onepo Daily 4)  Altace 5 Mg  Caps (Ramipril) .... Take 1 Tablet By Mouth Once A Day 5)  Zyrtec Allergy 10 Mg  Tabs (Cetirizine Hcl) .... As Needed 6)  Calcium .... Once Daily 7)  Multivitamin .... Once Daily 8)  Relpax 40 Mg  Tabs (Eletriptan Hydrobromide) .... One By Mouth As Needed Migraine Ha 9)  Lybrel 90-20 Mcg  Tabs (Levonorgestrel-Ethinyl Estrad) .... One Daily For 28 Days and Begin New Pack  Current Medications (verified): 1)  Levothyroxine Sodium 75 Mcg  Tabs (Levothyroxine Sodium) .... Take 1 Tablet By Mouth Once A Day 2)  Lipitor 10 Mg  Tabs (Atorvastatin Calcium) .... 1/2 Tablet Once Daily 3)  Aciphex 20 Mg  Tbec (Rabeprazole Sodium) .... Onepo Daily 4)  Altace 5 Mg  Caps (Ramipril) .... Take 1 Tablet By Mouth Once A Day 5)  Calcium .... Once Daily 6)  Multivitamin .... Once Daily 7)  Relpax 40 Mg  Tabs (Eletriptan Hydrobromide) .... One By Mouth As Needed Migraine Ha 8)  Lybrel 90-20 Mcg  Tabs (Levonorgestrel-Ethinyl Estrad) .... One Daily For 28 Days and Begin New Pack 9)  Advil 200 Mg Tabs (Ibuprofen) .... As Needed 10)  Maalox Plus 225-200-25 Mg/52ml Susp (Alum & Mag Hydroxide-Simeth) .... As Needed  Allergies (verified): 1)  ! Daypro 2)  !  Lamisil At (Terbinafine Hcl)  Past History:  Family History: Last updated: May 23, 2007 father died fro liver dz Family History of Alcoholism/Addiction mother lung cancer 48 Family History Lung cancer  Social History: Last updated: 05-23-07 Never Smoked Single  Risk Factors: Caffeine Use: 1 (May 23, 2007) Exercise: no (2007-05-23)  Risk Factors: Smoking Status: never (03/27/2010) Passive Smoke Exposure: no (03/27/2010)  Past medical, surgical, family and social histories (including risk factors) reviewed, and no changes noted (except as noted below).  Past Medical History: Reviewed history from 09/27/2007 and no changes  required. Headache Hypothyroidism Osteoarthritis Hypertension Hyperlipidemia Colonic polyps, hx of Diverticulosis, colon  Past Surgical History: Reviewed history from 05-23-2007 and no changes required. Colonoscopy-08/17/2006 BCCA from eyelid  Family History: Reviewed history from 05/23/07 and no changes required. father died fro liver dz Family History of Alcoholism/Addiction mother lung cancer 63 Family History Lung cancer  Social History: Reviewed history from May 23, 2007 and no changes required. Never Smoked Single  Review of Systems  The patient denies anorexia, fever, weight loss, weight gain, vision loss, decreased hearing, hoarseness, chest pain, syncope, dyspnea on exertion, peripheral edema, prolonged cough, headaches, hemoptysis, abdominal pain, melena, hematochezia, severe indigestion/heartburn, hematuria, incontinence, genital sores, muscle weakness, suspicious skin lesions, transient blindness, difficulty walking, depression, unusual weight change, abnormal bleeding, enlarged lymph nodes, angioedema, and breast masses.    Physical Exam  General:  Well-developed,well-nourished,in no acute distress; alert,appropriate and cooperative throughout examination Head:  Normocephalic and atraumatic without obvious abnormalities. No apparent alopecia or balding. Eyes:  No corneal or conjunctival inflammation noted. EOMI. Perrla. Funduscopic exam benign, without hemorrhages, exudates or papilledema. Vision grossly normal. Ears:  R ear normal and L ear normal.   Nose:  no external deformity and no nasal discharge.   Mouth:  good dentition and pharynx pink and moist.   Neck:  No deformities, masses, or tenderness noted. Lungs:  Normal respiratory effort, chest expands symmetrically. Lungs are clear to auscultation, no crackles or wheezes. Heart:  Normal rate and regular rhythm. S1 and S2 normal without gallop, murmur, click, rub or other extra sounds. Msk:  No deformity or  scoliosis noted of thoracic or lumbar spine.   Pulses:  R and L carotid,radial,femoral,dorsalis pedis and posterior tibial pulses are full and equal bilaterally Extremities:  No clubbing, cyanosis, edema, or deformity noted with normal full range of motion of all joints.   Neurologic:  No cranial nerve deficits noted. Station and gait are normal. Plantar reflexes are down-going bilaterally. DTRs are symmetrical throughout. Sensory, motor and coordinative functions appear intact.   Impression & Recommendations:  Problem # 1:  LUMBAR RADICULOPATHY, RIGHT (ICD-724.4) Assessment New i feel that her larger pendulos breast effect her back ( spondylosis is working diagnosis) Her updated medication list for this problem includes:    Advil 200 Mg Tabs (Ibuprofen) .Marland Kitchen... As needed  Discussed use of moist heat or ice, modified activities, medications, and stretching/strengthening exercises. Back care instructions given. To be seen in 2 weeks if no improvement; sooner if worsening of symptoms.   Orders: T-Lumbar Spine Comp w/[facility] [id number]) T-Thoracic Spine 2 Views [phone])  Problem # 2:  PREVENTIVE HEALTH CARE (ICD-V70.0)  due pap and wants reerral to dermatology needs puenmovax  Mammogram: ASSESSMENT: Negative - BI-RADS 1^MM DIGITAL SCREENING (08/14/2009) Colonoscopy: Abnormal (08/17/2006) Td Booster: Historical (09/15/2005)   Flu Vax: Fluvax 3+ (06/21/2009)   Chol: 158 (03/20/2010)   HDL: 41.00 (03/20/2010)   LDL: 99 (03/20/2010)   TG: 92.0 (03/20/2010) TSH: 2.18 (03/20/2010)    Discussed  using sunscreen, use of alcohol, drug use, self breast exam, routine dental care, routine eye care, schedule for GYN exam, routine physical exam, seat belts, multiple vitamins, osteoporosis prevention, adequate calcium intake in diet, recommendations for immunizations, mammograms and Pap smears.  Discussed exercise and checking cholesterol.  Discussed gun safety, safe sex, and contraception.  Complete  Medication List: 1)  Levothyroxine Sodium 75 Mcg Tabs (Levothyroxine sodium) .... Take 1 tablet by mouth once a day 2)  Lipitor 10 Mg Tabs (Atorvastatin calcium) .... 1/2 tablet once daily 3)  Aciphex 20 Mg Tbec (Rabeprazole sodium) .... Onepo daily 4)  Altace 5 Mg Caps (Ramipril) .... Take 1 tablet by mouth once a day 5)  Calcium  .... Once daily 6)  Multivitamin  .... Once daily 7)  Relpax 40 Mg Tabs (Eletriptan hydrobromide) .... One by mouth as needed migraine ha 8)  Lybrel 90-20 Mcg Tabs (Levonorgestrel-ethinyl estrad) .... One daily for 28 days and begin new pack 9)  Advil 200 Mg Tabs (Ibuprofen) .... As needed 10)  Maalox Plus 225-200-25 Mg/64ml Susp (Alum & mag hydroxide-simeth) .... As needed  Other Orders: EKG w/ Interpretation (93000)  Patient Instructions: 1)  Please schedule a follow-up appointment in 6 months. 2)  Hepatic Panel prior to visit, ICD-9:995.20 3)  Lipid Panel prior to visit, ICD-9:272.4 4)  TSH prior to visit, ICD-9:244.8  Prevention & Chronic Care Immunizations   Influenza vaccine: Fluvax 3+  (06/21/2009)    Tetanus booster: 09/15/2005: Historical    Pneumococcal vaccine: Not documented    H. zoster vaccine: 12/31/2007: Zostavax  Colorectal Screening   Hemoccult: Not documented    Colonoscopy: Abnormal  (08/17/2006)  Other Screening   Pap smear: Not documented    Mammogram: ASSESSMENT: Negative - BI-RADS 1^MM DIGITAL SCREENING  (08/14/2009)    DXA bone density scan: Not documented   Smoking status: never  (03/27/2010)  Lipids   Total Cholesterol: 158  (03/20/2010)   LDL: 99  (03/20/2010)   LDL Direct: Not documented   HDL: 41.00  (03/20/2010)   Triglycerides: 92.0  (03/20/2010)    SGOT (AST): 27  (03/20/2010)   SGPT (ALT): 33  (03/20/2010)   Alkaline phosphatase: 57  (03/20/2010)   Total bilirubin: 0.6  (03/20/2010)  Hypertension   Last Blood Pressure: 130 / 80  (03/27/2010)   Serum creatinine: 0.9  (03/20/2010)   Serum potassium  4.6  (03/20/2010)  Self-Management Support :    Hypertension self-management support: Not documented    Lipid self-management support: Not documented    Nursing Instructions: Give Pneumovax today

## 2010-10-15 NOTE — Assessment & Plan Note (Signed)
Summary: CPX/PT WILL COME IN FASTING/NJR   Vital Signs:  Patient Profile:   65 Years Old Female Height:     65 inches Weight:      174 pounds Temp:     98.2 degrees F oral Pulse rate:   72 / minute Resp:     12 per minute BP sitting:   132 / 70  (left arm)  Vitals Entered By: Willy Eddy, LPN (April 05, 2008 8:42 AM)                 Chief Complaint:  roa- fasting this am.  History of Present Illness:  Follow-Up Visit      This is a 65 year old woman who presents for Follow-up visit.  The patient denies chest pain, palpitations, dizziness, syncope, low blood sugar symptoms, high blood sugar symptoms, edema, SOB, DOE, PND, and orthopnea.  Since the last visit the patient notes no new problems or concerns.  The patient reports taking meds as prescribed and monitoring BP.  When questioned about possible medication side effects, the patient notes none.      Current Allergies: ! DAYPRO ! LAMISIL AT (TERBINAFINE HCL)  Past Medical History:    Reviewed history from 09/27/2007 and no changes required:       Headache       Hypothyroidism       Osteoarthritis       Hypertension       Hyperlipidemia       Colonic polyps, hx of       Diverticulosis, colon  Past Surgical History:    Reviewed history from 05/03/2007 and no changes required:       Colonoscopy-08/17/2006       BCCA from eyelid   Family History:    Reviewed history from 05/03/2007 and no changes required:       father died fro liver dz       Family History of Alcoholism/Addiction       mother lung cancer 66       Family History Lung cancer  Social History:    Reviewed history from 05/03/2007 and no changes required:       Never Smoked       Single    Review of Systems  The patient denies anorexia, fever, weight loss, weight gain, vision loss, decreased hearing, hoarseness, chest pain, syncope, dyspnea on exertion, peripheral edema, prolonged cough, headaches, hemoptysis, abdominal pain, melena,  hematochezia, severe indigestion/heartburn, hematuria, incontinence, genital sores, muscle weakness, suspicious skin lesions, transient blindness, difficulty walking, depression, unusual weight change, abnormal bleeding, enlarged lymph nodes, angioedema, and breast masses.     Physical Exam  General:     Well-developed,well-nourished,in no acute distress; alert,appropriate and cooperative throughout examination Head:     Normocephalic and atraumatic without obvious abnormalities. No apparent alopecia or balding. Eyes:     No corneal or conjunctival inflammation noted. EOMI. Perrla. Funduscopic exam benign, without hemorrhages, exudates or papilledema. Vision grossly normal. Ears:     External ear exam shows no significant lesions or deformities.  Otoscopic examination reveals clear canals, tympanic membranes are intact bilaterally without bulging, retraction, inflammation or discharge. Hearing is grossly normal bilaterally. Nose:     External nasal examination shows no deformity or inflammation. Nasal mucosa are pink and moist without lesions or exudates. Mouth:     Oral mucosa and oropharynx without lesions or exudates.  Teeth in good repair. Neck:     No deformities, masses, or tenderness  noted. Lungs:     Normal respiratory effort, chest expands symmetrically. Lungs are clear to auscultation, no crackles or wheezes. Heart:     Normal rate and regular rhythm. S1 and S2 normal without gallop, murmur, click, rub or other extra sounds.    Impression & Recommendations:  Problem # 1:  HYPERLIPIDEMIA (ICD-272.4)  Her updated medication list for this problem includes:    Lipitor 10 Mg Tabs (Atorvastatin calcium) .Marland Kitchen... 1/2 tablet once daily  Labs Reviewed: Chol: 178 (12/22/2007)   HDL: 37.7 (12/22/2007)   LDL: 121 (12/22/2007)   TG: 95 (12/22/2007) SGOT: 25 (06/24/2006)   SGPT: 38 (06/24/2006)  Lipid Goals: Chol Goal: 200 (09/27/2007)   HDL Goal: 40 (09/27/2007)   LDL Goal: 100  (09/27/2007)   TG Goal: 150 (09/27/2007)  Prior 10 Yr Risk Heart Disease: 13 % (12/31/2007)  Orders: TLB-Lipid Panel (80061-LIPID)   Problem # 2:  HYPERTENSION (ICD-401.9)  Her updated medication list for this problem includes:    Altace 5 Mg Caps (Ramipril) .Marland Kitchen... Take 1 tablet by mouth once a day  BP today: 132/70 Prior BP: 120/80 (12/31/2007)  Prior 10 Yr Risk Heart Disease: 13 % (12/31/2007)  Labs Reviewed: Creat: 0.9 (06/24/2006) Chol: 178 (12/22/2007)   HDL: 37.7 (12/22/2007)   LDL: 121 (12/22/2007)   TG: 95 (12/22/2007)  Orders: TLB-BMP (Basic Metabolic Panel-BMET) (80048-METABOL)   Problem # 3:  DIVERTICULOSIS, COLON (ICD-562.10)  Colonoscopy:  Labs Reviewed: Hgb: 14.9 (06/24/2006)   Hct: 44.7 (06/24/2006)      Problem # 4:  HYPOTHYROIDISM (ICD-244.9)  Her updated medication list for this problem includes:    Levothyroxine Sodium 75 Mcg Tabs (Levothyroxine sodium) .Marland Kitchen... Take 1 tablet by mouth once a day  Orders: TLB-TSH (Thyroid Stimulating Hormone) (84443-TSH)  Labs Reviewed: TSH: 1.82 (12/22/2007)    Chol: 178 (12/22/2007)   HDL: 37.7 (12/22/2007)   LDL: 121 (12/22/2007)   TG: 95 (12/22/2007)   Complete Medication List: 1)  Levothyroxine Sodium 75 Mcg Tabs (Levothyroxine sodium) .... Take 1 tablet by mouth once a day 2)  Lipitor 10 Mg Tabs (Atorvastatin calcium) .... 1/2 tablet once daily 3)  Aciphex 20 Mg Tbec (Rabeprazole sodium) .... Onepo daily 4)  Altace 5 Mg Caps (Ramipril) .... Take 1 tablet by mouth once a day 5)  Zyrtec Allergy 10 Mg Tabs (Cetirizine hcl) .... As needed 6)  Calcium  .... Once daily 7)  Multivitamin  .... Once daily 8)  Relpax 40 Mg Tabs (Eletriptan hydrobromide) .... One by mouth as needed migraine ha 9)  Lybrel 90-20 Mcg Tabs (Levonorgestrel-ethinyl estrad) .... One daily for 28 days and begin new pack  Other Orders: TLB-Hepatic/Liver Function Pnl (80076-HEPATIC)   Patient Instructions: 1)  PAP in nFEB 2010   ]

## 2010-10-15 NOTE — Assessment & Plan Note (Signed)
Summary: fup/ccm/PT RESCD/CCM   Vital Signs:  Patient Profile:   65 Years Old Female Height:     65 inches Weight:      172 pounds Temp:     98.1 degrees F oral Pulse rate:   76 / minute Resp:     12 per minute BP sitting:   136 / 80  (left arm)  Vitals Entered By: Willy Eddy, LPN (May 03, 2007 9:17 AM)               Chief Complaint:  roa.  History of Present Illness:  Hypertension Follow-Up      This is a 65 year old woman who presents for Hypertension follow-up.  Pt is compliant with meds but needs exercize.  The patient denies lightheadedness, urinary frequency, headaches, edema, impotence, rash, and fatigue.  The patient denies the following associated symptoms: chest pain, chest pressure, exercise intolerance, dyspnea, palpitations, syncope, leg edema, and pedal edema.  Compliance with medications (by patient report) has been near 100%.  The patient reports that dietary compliance has been good.  The patient reports exercising occasionally.  Adjunctive measures currently used by the patient include salt restriction.    Current Allergies: ! DAYPRO ! LAMISIL AT (TERBINAFINE HCL)  Past Medical History:    Reviewed history from 04/12/2007 and no changes required:       Headache       Hypothyroidism       Osteoarthritis       Hypertension       Hyperlipidemia  Past Surgical History:    Reviewed history from 04/12/2007 and no changes required:       Colonoscopy-08/17/2006       BCCA from eyelid   Family History:    Reviewed history and no changes required:       father died fro liver dz       Family History of Alcoholism/Addiction       mother lung cancer 67       Family History Lung cancer  Social History:    Never Smoked    Single   Risk Factors:  Passive smoke exposure:  no Drug use:  no HIV high-risk behavior:  no Caffeine use:  1 drinks per day Alcohol use:  yes Exercise:  no  Family History Risk Factors:    Family History of MI in  females < 60 years old:  no    Family History of MI in males < 58 years old:  no   Review of Systems  The patient denies anorexia, fever, weight loss, vision loss, decreased hearing, chest pain, syncope, peripheral edema, prolonged cough, abdominal pain, hematochezia, severe indigestion/heartburn, hematuria, genital sores, muscle weakness, suspicious skin lesions, transient blindness, abnormal bleeding, enlarged lymph nodes, angioedema, and breast masses.     Physical Exam  General:     alert.   Head:     normocephalic.   Eyes:     pupils equal and pupils round.   Ears:     R ear normal and L ear normal.   Nose:     no nasal discharge.   Mouth:     pharynx pink and moist.   Neck:     No deformities, masses, or tenderness noted. Lungs:     Normal respiratory effort, chest expands symmetrically. Lungs are clear to auscultation, no crackles or wheezes. Heart:     Normal rate and regular rhythm. S1 and S2 normal without gallop, murmur, click, rub or  other extra sounds. Abdomen:     soft and normal bowel sounds.   Msk:     no joint tenderness and no joint swelling.   Neurologic:     alert & oriented X3.   Skin:     turgor normal.   Cervical Nodes:     No lymphadenopathy noted Axillary Nodes:     No palpable lymphadenopathy Psych:     Oriented X3.      Impression & Recommendations:  Problem # 1:  HYPERTENSION (ICD-401.9)  Her updated medication list for this problem includes:    Altace 5 Mg Caps (Ramipril) .Marland Kitchen... Take 1 tablet by mouth once a day  BP today: 136/80  Labs Reviewed: Creat: 0.9 (06/24/2006) Chol: 170 (06/24/2006)   HDL: 33.0 (06/24/2006)   LDL: 114 CALC (06/24/2006)   TG: 116 (06/24/2006)   Problem # 2:  HYPERLIPIDEMIA (ICD-272.4)  Her updated medication list for this problem includes:    Lipitor 10 Mg Tabs (Atorvastatin calcium) .Marland Kitchen... 1/2 tablet once daily  Labs Reviewed: Chol: 170 (06/24/2006)   HDL: 33.0 (06/24/2006)   LDL: 114 CALC  (06/24/2006)   TG: 116 (06/24/2006) SGOT: 25 (06/24/2006)   SGPT: 38 (06/24/2006)   Problem # 3:  HYPOTHYROIDISM (ICD-244.9)  Her updated medication list for this problem includes:    Levothyroxine Sodium 75 Mcg Tabs (Levothyroxine sodium) .Marland Kitchen... Take 1 tablet by mouth once a day  Labs Reviewed: TSH: 3.62 (06/24/2006)    Chol: 170 (06/24/2006)   HDL: 33.0 (06/24/2006)   LDL: 114 CALC (06/24/2006)   TG: 116 (06/24/2006)   Complete Medication List: 1)  Levothyroxine Sodium 75 Mcg Tabs (Levothyroxine sodium) .... Take 1 tablet by mouth once a day 2)  Lipitor 10 Mg Tabs (Atorvastatin calcium) .... 1/2 tablet once daily 3)  Aciphex 20 Mg Tbec (Rabeprazole sodium) .... Onepo daily 4)  Altace 5 Mg Caps (Ramipril) .... Take 1 tablet by mouth once a day 5)  Zyrtec Allergy 10 Mg Tabs (Cetirizine hcl) .... As needed 6)  Nortrel 1/35 (21) 1-35 Mg-mcg Tabs (Norethindrone-eth estradiol) .... Asdirected 7)  Calcium  .... Once daily 8)  Multivitamin  .... Once daily 9)  Relpax 40 Mg Tabs (Eletriptan hydrobromide) .... One by mouth as needed migraine ha   Patient Instructions: 1)  Please schedule a follow-up appointment in 3 months. 2)  CPX 3)  BMP prior to visit, ICD-9: 4)  Hepatic Panel prior to visit, ICD-9: 5)  Lipid Panel prior to visit, ICD-9:   V70.0 6)  TSH prior to visit, ICD-9: 7)  CBC w/ Diff prior to visit, ICD-9: 8)  Urine-dip prior to visit, ICD-9:    Prescriptions: RELPAX 40 MG  TABS (ELETRIPTAN HYDROBROMIDE) one by mouth as needed migraine HA  #30 x 0   Entered and Authorized by:   Stacie Glaze MD   Signed by:   Stacie Glaze MD on 05/03/2007   Method used:   Print then Give to Patient   RxID:   [id number] LIPITOR 10 MG  TABS (ATORVASTATIN CALCIUM) 1/2 tablet once daily  #45 x 3   Entered and Authorized by:   Stacie Glaze MD   Signed by:   Stacie Glaze MD on 05/03/2007   Method used:   Print then Give to Patient   RxID:   [id number] ACIPHEX 20 MG   TBEC (RABEPRAZOLE SODIUM) onepo daily  #90 x 3   Entered and Authorized by:   Stacie Glaze MD  Signed by:   Stacie Glaze MD on 05/03/2007   Method used:   Print then Give to Patient   RxID:   [id number]

## 2010-10-15 NOTE — Assessment & Plan Note (Signed)
Summary: FLU-SHOT/RCD  Nurse Visit   Vitals Entered By: Orlan Leavens (August 31, 2007 1:31 PM)                 Prior Medications: LEVOTHYROXINE SODIUM 75 MCG  TABS (LEVOTHYROXINE SODIUM) Take 1 tablet by mouth once a day LIPITOR 10 MG  TABS (ATORVASTATIN CALCIUM) 1/2 tablet once daily ACIPHEX 20 MG  TBEC (RABEPRAZOLE SODIUM) onepo daily ALTACE 5 MG  CAPS (RAMIPRIL) Take 1 tablet by mouth once a day ZYRTEC ALLERGY 10 MG  TABS (CETIRIZINE HCL) as needed NORTREL 1/35 (21) 1-35 MG-MCG  TABS (NORETHINDRONE-ETH ESTRADIOL) asdirected CALCIUM () once daily MULTIVITAMIN () once daily RELPAX 40 MG  TABS (ELETRIPTAN HYDROBROMIDE) one by mouth as needed migraine HA Current Allergies: ! DAYPRO ! LAMISIL AT (TERBINAFINE HCL)   Influenza Vaccine    Vaccine Type: Fluvax Non-MCR    Site: right deltoid    Mfr: Sanofi Pasteur    Dose: 0.5 ml    Route: IM    Given by: Orlan Leavens    Exp. Date: 03/14/2008    Lot #: [id number]    VIS given: 08/31/07  Flu Vaccine Consent Questions    Do you have a history of severe allergic reactions to this vaccine? no    Any prior history of allergic reactions to egg and/or gelatin? no    Do you have a sensitivity to the preservative Thimersol? no    Do you have a past history of Guillan-Barre Syndrome? no    Do you currently have an acute febrile illness? no    Have you ever had a severe reaction to latex? no    Vaccine information given and explained to patient? yes    Are you currently pregnant? no   Orders Added: 1)  Influenza Vaccine NON MCR [00028]    ]

## 2010-10-15 NOTE — Progress Notes (Signed)
Summary: rs refill  Phone Note Call from Patient Call back at [phone]   Caller: pt live Call For: Stacie Glaze MD Summary of Call: patient needs rx refill for lybrel-28, ramipril 5mg   cap, need 90 day supply. [facility] delivery. Initial call taken by: Celine Ahr,  November 16, 2008 12:55 PM      Prescriptions: LYBREL 90-20 MCG  TABS (LEVONORGESTREL-ETHINYL ESTRAD) one daily for 28 days and begin new pack  #3 x 3   Entered by:   Willy Eddy, LPN   Authorized by:   Stacie Glaze MD   Signed by:   Willy Eddy, LPN on 45/40/9811   Method used:   Faxed to ...       [facility] (mail-order)       [id number]       [place], [place]  [address]       Ph: [phone]       Fax: [phone]   RxID:   [id number] ALTACE 5 MG  CAPS (RAMIPRIL) Take 1 tablet by mouth once a day  #90 x 3   Entered by:   Willy Eddy, LPN   Authorized by:   Stacie Glaze MD   Signed by:   Willy Eddy, LPN on 66/44/0347   Method used:   Faxed to ...       [facility] (mail-order)       [id number]       [place], [place]  [address]       Ph: [phone]       Fax: [phone]   RxID:   [id number] ALTACE 5 MG  CAPS (RAMIPRIL) Take 1 tablet by mouth once a day  #90 x 3   Entered by:   Willy Eddy, LPN   Authorized by:   Stacie Glaze MD   Signed by:   Willy Eddy, LPN on 22/10/5425   Method used:   Print then Give to Patient   RxID:   [id number] LYBREL 90-20 MCG  TABS (LEVONORGESTREL-ETHINYL ESTRAD) one daily for 28 days and begin new pack  #3 x 3   Entered by:   Willy Eddy, LPN   Authorized by:   Stacie Glaze MD   Signed by:   Willy Eddy, LPN on 07/37/1062   Method used:   Print then Give to Patient   RxID:   [id number]

## 2010-10-15 NOTE — Assessment & Plan Note (Signed)
Summary: flu shot/jls  Nurse Visit  Flu Vaccine Consent Questions     Do you have a history of severe allergic reactions to this vaccine? no    Any prior history of allergic reactions to egg and/or gelatin? no    Do you have a sensitivity to the preservative Thimersol? no    Do you have a past history of Guillan-Barre Syndrome? no    Do you currently have an acute febrile illness? no    Have you ever had a severe reaction to latex? no    Vaccine information given and explained to patient? yes    Are you currently pregnant? no    Lot Number:[id number]   Site Given  Left Deltoid IM Romualdo Bolk, CMA  June 27, 2008 5:14 PM   Prior Medications: LEVOTHYROXINE SODIUM 75 MCG  TABS (LEVOTHYROXINE SODIUM) Take 1 tablet by mouth once a day LIPITOR 10 MG  TABS (ATORVASTATIN CALCIUM) 1/2 tablet once daily ACIPHEX 20 MG  TBEC (RABEPRAZOLE SODIUM) onepo daily ALTACE 5 MG  CAPS (RAMIPRIL) Take 1 tablet by mouth once a day ZYRTEC ALLERGY 10 MG  TABS (CETIRIZINE HCL) as needed CALCIUM () once daily MULTIVITAMIN () once daily RELPAX 40 MG  TABS (ELETRIPTAN HYDROBROMIDE) one by mouth as needed migraine HA LYBREL 90-20 MCG  TABS (LEVONORGESTREL-ETHINYL ESTRAD) one daily for 28 days and begin new pack Current Allergies: ! DAYPRO ! LAMISIL AT (TERBINAFINE HCL)    Orders Added: 1)  Admin 1st Vaccine [90471] 2)  Flu Vaccine 5yrs + [place].Dew    ]

## 2010-10-15 NOTE — Assessment & Plan Note (Signed)
Summary: 3 mo follow-up/mae rsc per pt/njr   Vital Signs:  Patient Profile:   65 Years Old Female Height:     65 inches Weight:      175 pounds Temp:     98.2 degrees F oral Pulse rate:   14 / minute Resp:     14 per minute BP sitting:   120 / 80  (left arm)  Vitals Entered By: Willy Eddy, LPN (December 31, 2007 3:56 PM)                 Chief Complaint:  roa labs.  History of Present Illness: Current Problems:   DIVERTICULOSIS, COLON (ICD-562.10) COLONIC POLYPS, HX OF (ICD-V12.72) FAMILY HISTORY OF ALCOHOLISM/ADDICTION (ICD-V61.41) HYPERLIPIDEMIA (ICD-272.4)  stable HYPERTENSION (ICD-401.9) stable OSTEOARTHRITIS (ICD-715.90) HYPOTHYROIDISM (ICD-244.9) HEADACHE (ICD-784.0)  pattern reviewed    Hypertension History:      She denies headache, chest pain, palpitations, dyspnea with exertion, orthopnea, PND, peripheral edema, visual symptoms, neurologic problems, syncope, and side effects from treatment.        Positive major cardiovascular risk factors include female age 8 years old or older, hyperlipidemia, and hypertension.  Negative major cardiovascular risk factors include no history of diabetes, negative family history for ischemic heart disease, and non-tobacco-user status.        Further assessment for target organ damage reveals no history of ASHD, stroke/TIA, or peripheral vascular disease.       Prior Medication List:  LEVOTHYROXINE SODIUM 75 MCG  TABS (LEVOTHYROXINE SODIUM) Take 1 tablet by mouth once a day LIPITOR 10 MG  TABS (ATORVASTATIN CALCIUM) 1/2 tablet once daily ACIPHEX 20 MG  TBEC (RABEPRAZOLE SODIUM) onepo daily ALTACE 5 MG  CAPS (RAMIPRIL) Take 1 tablet by mouth once a day ZYRTEC ALLERGY 10 MG  TABS (CETIRIZINE HCL) as needed * CALCIUM once daily * MULTIVITAMIN once daily RELPAX 40 MG  TABS (ELETRIPTAN HYDROBROMIDE) one by mouth as needed migraine HA LYBREL 90-20 MCG  TABS (LEVONORGESTREL-ETHINYL ESTRAD) one daily for 28 days and begin new  pack   Current Allergies (reviewed today): ! DAYPRO ! LAMISIL AT (TERBINAFINE HCL)  Past Medical History:    Reviewed history from 09/27/2007 and no changes required:       Headache       Hypothyroidism       Osteoarthritis       Hypertension       Hyperlipidemia       Colonic polyps, hx of       Diverticulosis, colon  Past Surgical History:    Reviewed history from 05/03/2007 and no changes required:       Colonoscopy-08/17/2006       BCCA from eyelid   Family History:    Reviewed history from 05/03/2007 and no changes required:       father died fro liver dz       Family History of Alcoholism/Addiction       mother lung cancer 17       Family History Lung cancer  Social History:    Reviewed history from 05/03/2007 and no changes required:       Never Smoked       Single    Review of Systems  The patient denies anorexia, fever, weight loss, weight gain, vision loss, decreased hearing, hoarseness, chest pain, syncope, dyspnea on exhertion, peripheral edema, prolonged cough, hemoptysis, abdominal pain, melena, hematochezia, severe indigestion/heartburn, hematuria, incontinence, genital sores, muscle weakness, suspicious skin lesions, transient blindness, difficulty walking, depression, unusual  weight change, abnormal bleeding, enlarged lymph nodes, and angioedema.     Physical Exam  General:     Well-developed,well-nourished,in no acute distress; alert,appropriate and cooperative throughout examination Head:     Normocephalic and atraumatic without obvious abnormalities. No apparent alopecia or balding. Eyes:     No corneal or conjunctival inflammation noted. EOMI. Perrla. Funduscopic exam benign, without hemorrhages, exudates or papilledema. Vision grossly normal. Ears:     R ear normal and L ear normal.   Nose:     External nasal examination shows no deformity or inflammation. Nasal mucosa are pink and moist without lesions or exudates. Mouth:     Oral mucosa  and oropharynx without lesions or exudates.  Teeth in good repair. Neck:     No deformities, masses, or tenderness noted. Lungs:     Normal respiratory effort, chest expands symmetrically. Lungs are clear to auscultation, no crackles or wheezes. Heart:     Normal rate and regular rhythm. S1 and S2 normal without gallop, murmur, click, rub or other extra sounds. Abdomen:     Bowel sounds positive,abdomen soft and non-tender without masses, organomegaly or hernias noted. Msk:     No deformity or scoliosis noted of thoracic or lumbar spine.      Impression & Recommendations:  Problem # 1:  HYPERLIPIDEMIA (ICD-272.4)  Her updated medication list for this problem includes:    Lipitor 10 Mg Tabs (Atorvastatin calcium) .Marland Kitchen... 1/2 tablet once daily  Labs Reviewed: Chol: 178 (12/22/2007)   HDL: 37.7 (12/22/2007)   LDL: 121 (12/22/2007)   TG: 95 (12/22/2007) SGOT: 25 (06/24/2006)   SGPT: 38 (06/24/2006)  Lipid Goals: Chol Goal: 200 (09/27/2007)   HDL Goal: 40 (09/27/2007)   LDL Goal: 100 (09/27/2007)   TG Goal: 150 (09/27/2007)  10 Yr Risk Heart Disease: 13 % Prior 10 Yr Risk Heart Disease: 24 % (09/27/2007)   Problem # 2:  HYPERTENSION (ICD-401.9)  Her updated medication list for this problem includes:    Altace 5 Mg Caps (Ramipril) .Marland Kitchen... Take 1 tablet by mouth once a day  BP today: 120/80 Prior BP: 142/82 (09/27/2007)  10 Yr Risk Heart Disease: 13 % Prior 10 Yr Risk Heart Disease: 24 % (09/27/2007)  Labs Reviewed: Creat: 0.9 (06/24/2006) Chol: 178 (12/22/2007)   HDL: 37.7 (12/22/2007)   LDL: 121 (12/22/2007)   TG: 95 (12/22/2007)   Problem # 3:  OSTEOARTHRITIS (ICD-715.90) in pain in the handDiscussed use of medications, application of heat or cold, and exercises.   Complete Medication List: 1)  Levothyroxine Sodium 75 Mcg Tabs (Levothyroxine sodium) .... Take 1 tablet by mouth once a day 2)  Lipitor 10 Mg Tabs (Atorvastatin calcium) .... 1/2 tablet once daily 3)  Aciphex  20 Mg Tbec (Rabeprazole sodium) .... Onepo daily 4)  Altace 5 Mg Caps (Ramipril) .... Take 1 tablet by mouth once a day 5)  Zyrtec Allergy 10 Mg Tabs (Cetirizine hcl) .... As needed 6)  Calcium  .... Once daily 7)  Multivitamin  .... Once daily 8)  Relpax 40 Mg Tabs (Eletriptan hydrobromide) .... One by mouth as needed migraine ha 9)  Lybrel 90-20 Mcg Tabs (Levonorgestrel-ethinyl estrad) .... One daily for 28 days and begin new pack  Other Orders: Zoster (Shingles) Vaccine Live [id number]) Admin 1st Vaccine (98119)  Hypertension Assessment/Plan:      The patient's hypertensive risk group is category B: At least one risk factor (excluding diabetes) with no target organ damage.  Her calculated 10 year risk of coronary heart  disease is 13 %.  Today's blood pressure is 120/80.  Her blood pressure goal is < 140/90.   Patient Instructions: 1)  Please schedule a follow-up appointment in 3 months.   fasting cpx slot    ]  Zostavax # 1    Vaccine Type: Zostavax    Site: right deltoid    Mfr: Merck    Dose: 0.5 ml    Route: [place]    Given by: Willy Eddy, LPN    Exp. Date: 04/07/2009    Lot #: [id number]

## 2010-10-15 NOTE — Progress Notes (Signed)
----   Converted from flag ---- ---- 10/01/2007 3:02 PM, Stacie Glaze MD wrote: pap is normal ------------------------------ pt informed [employer]

## 2010-10-15 NOTE — Progress Notes (Signed)
Summary: Wait to get Flu Shot?  Phone Note Call from Patient Call back at Home Phone [phone]   Caller: Patient Summary of Call: Pt wants to come in later this week for flu injection, however she has been expericing fever, chills and fatigue the past couple of days.  Should she wait a week or so before getting flu shot? Initial call taken by: Trixie Dredge,  June 03, 2010 12:29 PM  Follow-up for Phone Call        needs to be afebrile for 24 hours before getting flu shot Follow-up by: Willy Eddy, LPN,  June 03, 2010 12:33 PM  Additional Follow-up for Phone Call Additional follow up Details #1::        Phone Call Completed Additional Follow-up by: Rudy Jew, RN,  June 03, 2010 2:38 PM

## 2010-10-15 NOTE — Assessment & Plan Note (Signed)
Summary: 1 MONTH F/U//ALP   Vital Signs:  Patient profile:   65 year old female Height:      65 inches Weight:      176 pounds BMI:     29.39 Temp:     98.2 degrees F oral Pulse rate:   72 / minute Resp:     14 per minute BP sitting:   120 / 80  (left arm)  Vitals Entered By: Willy Eddy, LPN (July 31, 2010 1:35 PM) CC: f/u up on allergic reactions- 1 other lip swelling episode and 2 hand swelling episodes , Hypertension Management Is Patient Diabetic? No   Primary Care Provider:  Stacie Glaze MD  CC:  f/u up on allergic reactions- 1 other lip swelling episode and 2 hand swelling episodes  and Hypertension Management.  History of Present Illness: three episodes of allergy... swelling of lip and hands and feet all responded to prednisone the symptoms fo the lip were 12 hours the hand and feet for 8-10  hours The pt has been on ramipril The pt still has back pain althought the PT helped we discussed the referral to an allergist  Hypertension History:      She denies headache, chest pain, palpitations, dyspnea with exertion, orthopnea, PND, peripheral edema, visual symptoms, neurologic problems, syncope, and side effects from treatment.        Positive major cardiovascular risk factors include female age 10 years old or older, hyperlipidemia, and hypertension.  Negative major cardiovascular risk factors include no history of diabetes, negative family history for ischemic heart disease, and non-tobacco-user status.        Further assessment for target organ damage reveals no history of ASHD, stroke/TIA, or peripheral vascular disease.     Preventive Screening-Counseling & Management  Alcohol-Tobacco     Smoking Status: never     Passive Smoke Exposure: no     Tobacco Counseling: not indicated; no tobacco use  Problems Prior to Update: 1)  Angioedema  (ICD-995.1) 2)  Preventive Health Care  (ICD-V70.0) 3)  Lumbar Radiculopathy, Right  (ICD-724.4) 4)  Uns Advrs  Eff Uns Rx Medicinal&biological Sbstnc  (ICD-995.20) 5)  Diverticulosis, Colon  (ICD-562.10) 6)  Colonic Polyps, Hx of  (ICD-V12.72) 7)  Family History of Alcoholism/addiction  (ICD-V61.41) 8)  Hyperlipidemia  (ICD-272.4) 9)  Hypertension  (ICD-401.9) 10)  Osteoarthritis  (ICD-715.90) 11)  Hypothyroidism  (ICD-244.9) 12)  Headache  (ICD-784.0)  Current Problems (verified): 1)  Angioedema  (ICD-995.1) 2)  Preventive Health Care  (ICD-V70.0) 3)  Lumbar Radiculopathy, Right  (ICD-724.4) 4)  Uns Advrs Eff Uns Rx Medicinal&biological Sbstnc  (ICD-995.20) 5)  Diverticulosis, Colon  (ICD-562.10) 6)  Colonic Polyps, Hx of  (ICD-V12.72) 7)  Family History of Alcoholism/addiction  (ICD-V61.41) 8)  Hyperlipidemia  (ICD-272.4) 9)  Hypertension  (ICD-401.9) 10)  Osteoarthritis  (ICD-715.90) 11)  Hypothyroidism  (ICD-244.9) 12)  Headache  (ICD-784.0)  Medications Prior to Update: 1)  Levothyroxine Sodium 75 Mcg  Tabs (Levothyroxine Sodium) .... Take 1 Tablet By Mouth Once A Day 2)  Lipitor 10 Mg  Tabs (Atorvastatin Calcium) .... 1/2 Tablet Once Daily 3)  Aciphex 20 Mg  Tbec (Rabeprazole Sodium) .... Onepo Daily 4)  Altace 5 Mg  Caps (Ramipril) .... Take 1 Tablet By Mouth Once A Day 5)  Calcium .... Once Daily 6)  Multivitamin .... Once Daily 7)  Relpax 40 Mg  Tabs (Eletriptan Hydrobromide) .... One By Mouth As Needed Migraine Ha 8)  Lybrel 90-20 Mcg  Tabs (Levonorgestrel-Ethinyl Estrad) .... One Daily For 28 Days and Begin New Pack 9)  Advil 200 Mg Tabs (Ibuprofen) .... As Needed 10)  Maalox Plus 225-200-25 Mg/39ml Susp (Alum & Mag Hydroxide-Simeth) .... As Needed 11)  Benadryl 25 Mg Tabs (Diphenhydramine Hcl) .Marland Kitchen.. 1 Once Daily As Needed 12)  Prednisone 20 Mg Tabs (Prednisone) .... Take Two When The Swelling Occurs  Current Medications (verified): 1)  Levothyroxine Sodium 75 Mcg  Tabs (Levothyroxine Sodium) .... Take 1 Tablet By Mouth Once A Day 2)  Lipitor 10 Mg  Tabs (Atorvastatin Calcium)  .... 1/2 Tablet Once Daily 3)  Cimetidine 800 Mg Tabs (Cimetidine) .... One By Mouth Q Hs 4)  Bisoprolol-Hydrochlorothiazide 2.5-6.25 Mg Tabs (Bisoprolol-Hydrochlorothiazide) .... One By Mouth Daily 5)  Calcium .... Once Daily 6)  Multivitamin .... Once Daily 7)  Relpax 40 Mg  Tabs (Eletriptan Hydrobromide) .... One By Mouth As Needed Migraine Ha 8)  Lybrel 90-20 Mcg  Tabs (Levonorgestrel-Ethinyl Estrad) .... One Daily For 28 Days and Begin New Pack 9)  Advil 200 Mg Tabs (Ibuprofen) .... As Needed 10)  Maalox Plus 225-200-25 Mg/35ml Susp (Alum & Mag Hydroxide-Simeth) .... As Needed 11)  Benadryl 25 Mg Tabs (Diphenhydramine Hcl) .Marland Kitchen.. 1 Once Daily As Needed 12)  Prednisone 20 Mg Tabs (Prednisone) .... Take Two When The Swelling Occurs  Allergies (verified): 1)  ! Daypro 2)  ! Lamisil At (Terbinafine Hcl)  Past History:  Family History: Last updated: 2007-05-24 father died fro liver dz Family History of Alcoholism/Addiction mother lung cancer 70 Family History Lung cancer  Social History: Last updated: 24-May-2007 Never Smoked Single  Risk Factors: Caffeine Use: 1 (May 24, 2007) Exercise: no (05-24-2007)  Risk Factors: Smoking Status: never (07/31/2010) Passive Smoke Exposure: no (07/31/2010)  Past medical, surgical, family and social histories (including risk factors) reviewed, and no changes noted (except as noted below).  Past Medical History: Reviewed history from 09/27/2007 and no changes required. Headache Hypothyroidism Osteoarthritis Hypertension Hyperlipidemia Colonic polyps, hx of Diverticulosis, colon  Past Surgical History: Reviewed history from May 24, 2007 and no changes required. Colonoscopy-08/17/2006 BCCA from eyelid  Family History: Reviewed history from 05/24/2007 and no changes required. father died fro liver dz Family History of Alcoholism/Addiction mother lung cancer 37 Family History Lung cancer  Social History: Reviewed history from  2007/05/24 and no changes required. Never Smoked Single  Review of Systems       The patient complains of angioedema.  The patient denies anorexia, fever, weight loss, weight gain, vision loss, decreased hearing, hoarseness, chest pain, syncope, dyspnea on exertion, peripheral edema, prolonged cough, headaches, hemoptysis, abdominal pain, melena, hematochezia, severe indigestion/heartburn, hematuria, incontinence, genital sores, muscle weakness, suspicious skin lesions, transient blindness, difficulty walking, depression, unusual weight change, abnormal bleeding, enlarged lymph nodes, and breast masses.         could this be angioedema to the ace  Physical Exam  General:  Well-developed,well-nourished,in no acute distress; alert,appropriate and cooperative throughout examination Head:  Normocephalic and atraumatic without obvious abnormalities. No apparent alopecia or balding. Eyes:  pupils equal and pupils round.   Ears:  R ear normal and L ear normal.   Nose:  no external deformity and no nasal discharge.   Neck:  No deformities, masses, or tenderness noted. Lungs:  Normal respiratory effort, chest expands symmetrically. Lungs are clear to auscultation, no crackles or wheezes. Heart:  Normal rate and regular rhythm. S1 and S2 normal without gallop, murmur, click, rub or other extra sounds. Msk:  No deformity or scoliosis noted  of thoracic or lumbar spine.   Extremities:  No clubbing, cyanosis, edema, or deformity noted with normal full range of motion of all joints.   Neurologic:  No cranial nerve deficits noted. Station and gait are normal. Plantar reflexes are down-going bilaterally. DTRs are symmetrical throughout. Sensory, motor and coordinative functions appear intact.   Impression & Recommendations:  Problem # 1:  ANGIOEDEMA (ICD-995.1) Assessment Deteriorated  the pt has recurrent  Orders: T-Allergy Profile Region II-DC, [place], [place], [place], [place] [phone])  Problem # 2:  HYPERTENSION  (ICD-401.9) Assessment: Unchanged  Her updated medication list for this problem includes:    Bisoprolol-hydrochlorothiazide 2.5-6.25 Mg Tabs (Bisoprolol-hydrochlorothiazide) ..... One by mouth daily  BP today: 120/80 Prior BP: 122/80 (06/21/2010)  10 Yr Risk Heart Disease: 9 % Prior 10 Yr Risk Heart Disease: 13 % (12/31/2007)  Labs Reviewed: K+: 4.6 (03/20/2010) Creat: : 0.9 (03/20/2010)   Chol: 158 (03/20/2010)   HDL: 41.00 (03/20/2010)   LDL: 99 (03/20/2010)   TG: 92.0 (03/20/2010)  Orders: TLB-BMP (Basic Metabolic Panel-BMET) (80048-METABOL)  Problem # 3:  HYPERTENSION (ICD-401.9)  Her updated medication list for this problem includes:    Bisoprolol-hydrochlorothiazide 2.5-6.25 Mg Tabs (Bisoprolol-hydrochlorothiazide) ..... One by mouth daily  BP today: 120/80 Prior BP: 122/80 (06/21/2010)  10 Yr Risk Heart Disease: 9 % Prior 10 Yr Risk Heart Disease: 13 % (12/31/2007)  Labs Reviewed: K+: 4.6 (03/20/2010) Creat: : 0.9 (03/20/2010)   Chol: 158 (03/20/2010)   HDL: 41.00 (03/20/2010)   LDL: 99 (03/20/2010)   TG: 92.0 (03/20/2010)  Orders: TLB-BMP (Basic Metabolic Panel-BMET) (80048-METABOL)  Problem # 4:  HYPERLIPIDEMIA (ICD-272.4)  Her updated medication list for this problem includes:    Lipitor 10 Mg Tabs (Atorvastatin calcium) .Marland Kitchen... 1/2 tablet once daily  BP today: 120/80 Prior BP: 122/80 (06/21/2010)  10 Yr Risk Heart Disease: 9 % Prior 10 Yr Risk Heart Disease: 13 % (12/31/2007)  Labs Reviewed: K+: 4.6 (03/20/2010) Creat: : 0.9 (03/20/2010)   Chol: 158 (03/20/2010)   HDL: 41.00 (03/20/2010)   LDL: 99 (03/20/2010)   TG: 92.0 (03/20/2010)  Orders: Venipuncture (96045) Specimen Handling (40981) TLB-Cholesterol, HDL (83718-HDL) TLB-Cholesterol, Direct LDL (83721-DIRLDL) TLB-Cholesterol, Total (82465-CHO)  Problem # 5:  HYPOTHYROIDISM (ICD-244.9)  Her updated medication list for this problem includes:    Levothyroxine Sodium 75 Mcg Tabs (Levothyroxine  sodium) .Marland Kitchen... Take 1 tablet by mouth once a day  Labs Reviewed: TSH: 2.18 (03/20/2010)    Chol: 158 (03/20/2010)   HDL: 41.00 (03/20/2010)   LDL: 99 (03/20/2010)   TG: 92.0 (03/20/2010)  Orders: Venipuncture (19147) Specimen Handling (82956) TLB-TSH (Thyroid Stimulating Hormone) (84443-TSH)  Complete Medication List: 1)  Levothyroxine Sodium 75 Mcg Tabs (Levothyroxine sodium) .... Take 1 tablet by mouth once a day 2)  Lipitor 10 Mg Tabs (Atorvastatin calcium) .... 1/2 tablet once daily 3)  Cimetidine 800 Mg Tabs (Cimetidine) .... One by mouth q hs 4)  Bisoprolol-hydrochlorothiazide 2.5-6.25 Mg Tabs (Bisoprolol-hydrochlorothiazide) .... One by mouth daily 5)  Calcium  .... Once daily 6)  Multivitamin  .... Once daily 7)  Relpax 40 Mg Tabs (Eletriptan hydrobromide) .... One by mouth as needed migraine ha 8)  Lybrel 90-20 Mcg Tabs (Levonorgestrel-ethinyl estrad) .... One daily for 28 days and begin new pack 9)  Advil 200 Mg Tabs (Ibuprofen) .... As needed 10)  Maalox Plus 225-200-25 Mg/47ml Susp (Alum & mag hydroxide-simeth) .... As needed 11)  Benadryl 25 Mg Tabs (Diphenhydramine hcl) .Marland Kitchen.. 1 once daily as needed 12)  Prednisone 20 Mg Tabs (Prednisone) .Marland KitchenMarland KitchenMarland Kitchen  Take two when the swelling occurs  Hypertension Assessment/Plan:      The patient's hypertensive risk group is category B: At least one risk factor (excluding diabetes) with no target organ damage.  Her calculated 10 year risk of coronary heart disease is 9 %.  Today's blood pressure is 120/80.  Her blood pressure goal is < 140/90.  Patient Instructions: 1)  Please schedule a follow-up appointment in 1 months. Prescriptions: BISOPROLOL-HYDROCHLOROTHIAZIDE 2.5-6.25 MG TABS (BISOPROLOL-HYDROCHLOROTHIAZIDE) one by mouth daily  #30 x 11   Entered and Authorized by:   Stacie Glaze MD   Signed by:   Stacie Glaze MD on 07/31/2010   Method used:   Electronically to        [facility] [address] (retail)       [address]       [place], [place]  [address]       Ph: [phone]       Fax: [phone]   RxID:   [id number] CIMETIDINE 800 MG TABS (CIMETIDINE) one by mouth q HS  #30 x 11   Entered and Authorized by:   Stacie Glaze MD   Signed by:   Stacie Glaze MD on 07/31/2010   Method used:   Faxed to ...       [facility] (mail-order)             , [place]         Ph: [phone]       Fax: [phone]   RxID:   [id number]    Orders Added: 1)  T-Allergy Profile Region II-DC, [place], MD, [place], [place] [[id number]] 2)  Venipuncture [84166] 3)  Specimen Handling [99000] 4)  TLB-Cholesterol, HDL [83718-HDL] 5)  TLB-Cholesterol, Direct LDL [83721-DIRLDL] 6)  TLB-Cholesterol, Total [82465-CHO] 7)  TLB-BMP (Basic Metabolic Panel-BMET) [80048-METABOL] 8)  TLB-TSH (Thyroid Stimulating Hormone) [84443-TSH] 9)  Est. Patient Level IV [06301]   Immunization History:  Influenza Immunization History:    Influenza:  historical (06/21/2010)   Immunization History:  Influenza Immunization History:    Influenza:  Historical (06/21/2010)

## 2010-10-17 NOTE — Consult Note (Signed)
Summary: [facility]  [facility]   Imported By: Maryln Gottron 09/12/2010 09:59:46  _____________________________________________________________________  External Attachment:    Type:   Image     Comment:   External Document

## 2010-10-17 NOTE — Assessment & Plan Note (Signed)
Summary: 1 month f/u//alp/pt rescd from bump//ccm   Vital Signs:  Patient profile:   65 year old female Height:      65 inches (165.10 cm) Weight:      181 pounds (82.27 kg) BMI:     30.23 O2 Sat:      95 % on Room air Temp:     98.5 degrees F (36.94 degrees C) oral Pulse rate:   60 / minute Pulse rhythm:   regular BP sitting:   102 / 70  (left arm) Cuff size:   large  Vitals Entered By: Brenton Grills CMA Duncan Dull) (September 03, 2010 10:39 AM)  O2 Flow:  Room air CC: 1 month F/U/aj Is Patient Diabetic? No   Primary Care Provider:  Stacie Glaze MD  CC:  1 month F/U/aj.  History of Present Illness: The pt has been to Dr [place] Callas and has been tested with allergies being the probable acuse ( dust mights) has not had any angioedema episodes since started the allergy protocol.  Tests show pecan and mold allergies 4++ Neck and upper back strain into shoulders Has not used ice and heat applications  Current Medications (verified): 1)  Levothyroxine Sodium 75 Mcg  Tabs (Levothyroxine Sodium) .... Take 1 Tablet By Mouth Once A Day 2)  Lipitor 10 Mg  Tabs (Atorvastatin Calcium) .... 1/2 Tablet Once Daily 3)  Cimetidine 800 Mg Tabs (Cimetidine) .... One By Mouth Q Hs 4)  Bisoprolol-Hydrochlorothiazide 2.5-6.25 Mg Tabs (Bisoprolol-Hydrochlorothiazide) .... One By Mouth Daily 5)  Calcium .... Once Daily 6)  Multivitamin .... Once Daily 7)  Relpax 40 Mg  Tabs (Eletriptan Hydrobromide) .... One By Mouth As Needed Migraine Ha 8)  Lybrel 90-20 Mcg  Tabs (Levonorgestrel-Ethinyl Estrad) .... One Daily For 28 Days and Begin New Pack 9)  Advil 200 Mg Tabs (Ibuprofen) .... As Needed 10)  Maalox Plus 225-200-25 Mg/42ml Susp (Alum & Mag Hydroxide-Simeth) .... As Needed 11)  Benadryl 25 Mg Tabs (Diphenhydramine Hcl) .Marland Kitchen.. 1 Once Daily As Needed 12)  Prednisone 20 Mg Tabs (Prednisone) .... Take Two When The Swelling Occurs 13)  Aciphex 20 Mg Tbec (Rabeprazole Sodium) .Marland Kitchen.. 1 By Mouth Once Daily 14)   Epipen 2-Pak 0.3 Mg/0.57ml Devi (Epinephrine) .... As Needed 15)  Claritin 10 Mg Caps (Loratadine) .Marland Kitchen.. 1 By Mouth Every Morning 16)  [facility] Allergy Relief 5 Mg/75ml Syrp (Loratadine) .... 2 Teaspoons As Needed 17)  Levocetirizine Dihydrochloride 5 Mg Tabs (Levocetirizine Dihydrochloride) .Marland Kitchen.. 1 Tablet By Mouth At Bedtime  Allergies (verified): 1)  ! Daypro 2)  ! Lamisil At (Terbinafine Hcl)   Impression & Recommendations:  Problem # 1:  HYPERTENSION (ICD-401.9)  Her updated medication list for this problem includes:    Bisoprolol-hydrochlorothiazide 2.5-6.25 Mg Tabs (Bisoprolol-hydrochlorothiazide) ..... One by mouth daily  BP today: 102/70 Prior BP: 120/80 (07/31/2010)  Prior 10 Yr Risk Heart Disease: 9 % (07/31/2010)  Labs Reviewed: K+: 4.1 (07/31/2010) Creat: : 0.8 (07/31/2010)   Chol: 184 (07/31/2010)   HDL: 43.10 (07/31/2010)   LDL: 99 (03/20/2010)   TG: 92.0 (03/20/2010)  Problem # 2:  HYPERLIPIDEMIA (ICD-272.4) Assessment: Unchanged  Her updated medication list for this problem includes:    Lipitor 10 Mg Tabs (Atorvastatin calcium) ..... One by mouth daily please give the generic atorvastatin  Labs Reviewed: SGOT: 27 (03/20/2010)   SGPT: 33 (03/20/2010)  Lipid Goals: Chol Goal: 200 (09/27/2007)   HDL Goal: 40 (09/27/2007)   LDL Goal: 100 (09/27/2007)   TG Goal: 150 (09/27/2007)  Prior 10 Yr Risk Heart Disease: 9 % (07/31/2010)   HDL:43.10 (07/31/2010), 41.00 (03/20/2010)  LDL:99 (03/20/2010), 105 (16/06/9603)  Chol:184 (07/31/2010), 158 (03/20/2010)  Trig:92.0 (03/20/2010), 92 (04/05/2008)  Problem # 3:  HEADACHE (ICD-784.0)  Her updated medication list for this problem includes:    Bisoprolol-hydrochlorothiazide 2.5-6.25 Mg Tabs (Bisoprolol-hydrochlorothiazide) ..... One by mouth daily    Relpax 40 Mg Tabs (Eletriptan hydrobromide) ..... One by mouth as needed migraine ha    Advil 200 Mg Tabs (Ibuprofen) .Marland Kitchen... As needed  Headache diary  reviewed.  Problem # 4:  ESOPHAGEAL REFLUX (ICD-530.81) the pt was on cimetidine 800 mg withour control of the gerd and has resumed the aciphex which has been effective Her updated medication list for this problem icludes:    Cimetidine 800 Mg Tabs (Cimetidine) ..... One by mouth q hs    Maalox Plus 225-200-25 Mg/36ml Susp (Alum & mag hydroxide-simeth) .Marland Kitchen... As needed    Aciphex 20 Mg Tbec (Rabeprazole sodium) .Marland Kitchen... 1 by mouth once daily  Complete Medication List: 1)  Levothyroxine Sodium 75 Mcg Tabs (Levothyroxine sodium) .... Take 1 tablet by mouth once a day 2)  Lipitor 10 Mg Tabs (Atorvastatin calcium) .... One by mouth daily please give the generic atorvastatin 3)  Bisoprolol-hydrochlorothiazide 2.5-6.25 Mg Tabs (Bisoprolol-hydrochlorothiazide) .... One by mouth daily 4)  Calcium  .... Once daily 5)  Multivitamin  .... Once daily 6)  Relpax 40 Mg Tabs (Eletriptan hydrobromide) .... One by mouth as needed migraine ha 7)  Lybrel 90-20 Mcg Tabs (Levonorgestrel-ethinyl estrad) .... One daily for 28 days and begin new pack 8)  Advil 200 Mg Tabs (Ibuprofen) .... As needed 9)  Maalox Plus 225-200-25 Mg/27ml Susp (Alum & mag hydroxide-simeth) .... As needed 10)  Benadryl 25 Mg Tabs (Diphenhydramine hcl) .Marland Kitchen.. 1 once daily as needed 11)  Prednisone 20 Mg Tabs (Prednisone) .... Take two when the swelling occurs 12)  Aciphex 20 Mg Tbec (Rabeprazole sodium) .Marland Kitchen.. 1 by mouth once daily 13)  Epipen 2-pak 0.3 Mg/0.56ml Devi (Epinephrine) .... As needed 14)  Claritin 10 Mg Caps (Loratadine) .Marland Kitchen.. 1 by mouth every morning 15)  [facility] Children[facility] Allergy Relief 5 Mg/53ml Syrp (Loratadine) .... 2 teaspoons as needed 16)  Levocetirizine Dihydrochloride 5 Mg Tabs (Levocetirizine dihydrochloride) .Marland Kitchen.. 1 tablet by mouth at bedtime  Patient Instructions: 1)  Aplly ice  ( pack) for 15 min then heat for 15 min 2)  to site 3)  July CPX Prescriptions: LIPITOR 10 MG  TABS (ATORVASTATIN CALCIUM) one by mouth daily please  give the generic atorvastatin  #90 x 3   Entered and Authorized by:   Stacie Glaze MD   Signed by:   Stacie Glaze MD on 09/03/2010   Method used:   Faxed to ...       [facility] (mail-order)             , [place]         Ph: [phone]       Fax: [phone]   RxID:   [id number]    Orders Added: 1)  Est. Patient Level IV [32440]

## 2010-12-18 ENCOUNTER — Other Ambulatory Visit: Payer: Self-pay | Admitting: *Deleted

## 2010-12-18 DIAGNOSIS — Z79890 Hormone replacement therapy (postmenopausal): Secondary | ICD-10-CM

## 2010-12-18 MED ORDER — LEVONORGESTREL-ETHINYL ESTRAD 90-20 MCG PO TABS: 1.0000 | ORAL_TABLET | Freq: Every day | ORAL | Status: DC

## 2011-01-15 ENCOUNTER — Other Ambulatory Visit: Payer: Self-pay | Admitting: *Deleted

## 2011-01-15 MED ORDER — LEVOTHYROXINE SODIUM 75 MCG PO TABS: 75.0000 ug | ORAL_TABLET | Freq: Every day | ORAL | Status: DC

## 2011-01-22 ENCOUNTER — Telehealth: Payer: Self-pay | Admitting: *Deleted

## 2011-01-22 DIAGNOSIS — M792 Neuralgia and neuritis, unspecified: Secondary | ICD-10-CM

## 2011-01-22 NOTE — Telephone Encounter (Signed)
Pt is having recurrent disc symptoms, but mostly cervical......tingling and electrical currents down right arm.  Wants to know if Dr. Lovell Sheehan wants to refer her to a neurologist or PT or what???

## 2011-01-22 NOTE — Telephone Encounter (Signed)
Per dr Lovell Sheehan- may have neurolgoist referral

## 2011-01-22 NOTE — Telephone Encounter (Signed)
LMTCB

## 2011-01-23 NOTE — Telephone Encounter (Signed)
Pt notified a neurology referral is in progress for her.

## 2011-01-23 NOTE — Telephone Encounter (Signed)
LMTCB again

## 2011-01-29 ENCOUNTER — Telehealth: Payer: Self-pay | Admitting: *Deleted

## 2011-01-29 NOTE — Telephone Encounter (Signed)
Pt is calling and inquiring about her neurology appt.  Sent call to Terri to update her status.

## 2011-03-13 ENCOUNTER — Encounter: Payer: Self-pay | Admitting: Internal Medicine

## 2011-03-17 ENCOUNTER — Other Ambulatory Visit (INDEPENDENT_AMBULATORY_CARE_PROVIDER_SITE_OTHER): Payer: Medicare HMO

## 2011-03-17 DIAGNOSIS — I10 Essential (primary) hypertension: Secondary | ICD-10-CM

## 2011-03-17 DIAGNOSIS — R635 Abnormal weight gain: Secondary | ICD-10-CM

## 2011-03-17 DIAGNOSIS — Z0000 Encounter for general adult medical examination without abnormal findings: Secondary | ICD-10-CM

## 2011-03-17 DIAGNOSIS — Z79899 Other long term (current) drug therapy: Secondary | ICD-10-CM

## 2011-03-17 LAB — CBC WITH DIFFERENTIAL/PLATELET
Basophils Absolute: 0.1 10*3/uL (ref 0.0–0.1)
Basophils Relative: 0.5 % (ref 0.0–3.0)
Eosinophils Absolute: 0.2 10*3/uL (ref 0.0–0.7)
Eosinophils Relative: 1.9 % (ref 0.0–5.0)
HCT: 42.5 % (ref 36.0–46.0)
Hemoglobin: 14.4 g/dL (ref 12.0–15.0)
Lymphocytes Relative: 33.3 % (ref 12.0–46.0)
Lymphs Abs: 3.7 10*3/uL (ref 0.7–4.0)
MCHC: 34 g/dL (ref 30.0–36.0)
MCV: 93.8 fl (ref 78.0–100.0)
Monocytes Absolute: 0.6 10*3/uL (ref 0.1–1.0)
Monocytes Relative: 5.6 % (ref 3.0–12.0)
Neutro Abs: 6.5 10*3/uL (ref 1.4–7.7)
Neutrophils Relative %: 58.7 % (ref 43.0–77.0)
Platelets: 303 10*3/uL (ref 150.0–400.0)
RBC: 4.53 Mil/uL (ref 3.87–5.11)
RDW: 14.2 % (ref 11.5–14.6)
WBC: 11.1 10*3/uL — ABNORMAL HIGH (ref 4.5–10.5)

## 2011-03-17 LAB — BASIC METABOLIC PANEL
BUN: 12 mg/dL (ref 6–23)
CO2: 27 mEq/L (ref 19–32)
Calcium: 8.9 mg/dL (ref 8.4–10.5)
Chloride: 104 mEq/L (ref 96–112)
Creatinine, Ser: 0.7 mg/dL (ref 0.4–1.2)
GFR: 88.26 mL/min (ref >60.00)
Glucose, Bld: 85 mg/dL (ref 70–99)
Potassium: 4.2 mEq/L (ref 3.5–5.1)
Sodium: 140 mEq/L (ref 135–145)

## 2011-03-17 LAB — LIPID PANEL
Cholesterol: 147 mg/dL (ref 0–200)
HDL: 36 mg/dL — ABNORMAL LOW (ref >39.00)
LDL Cholesterol: 92 mg/dL (ref 0–99)
Total CHOL/HDL Ratio: 4
Triglycerides: 96 mg/dL (ref 0.0–149.0)
VLDL: 19.2 mg/dL (ref 0.0–40.0)

## 2011-03-17 LAB — POCT URINALYSIS DIPSTICK
Bilirubin, UA: NEGATIVE
Blood, UA: NEGATIVE
Glucose, UA: NEGATIVE
Ketones, UA: NEGATIVE
Leukocytes, UA: NEGATIVE
Nitrite, UA: NEGATIVE
Protein, UA: NEGATIVE
Spec Grav, UA: 1.02
Urobilinogen, UA: 1
pH, UA: 6.5

## 2011-03-17 LAB — HEPATIC FUNCTION PANEL
ALT: 33 U/L (ref 0–35)
AST: 32 U/L (ref 0–37)
Albumin: 3.7 g/dL (ref 3.5–5.2)
Alkaline Phosphatase: 53 U/L (ref 39–117)
Bilirubin, Direct: 0.1 mg/dL (ref 0.0–0.3)
Total Bilirubin: 0.6 mg/dL (ref 0.3–1.2)
Total Protein: 6.6 g/dL (ref 6.0–8.3)

## 2011-03-17 LAB — TSH: TSH: 2.41 u[IU]/mL (ref 0.35–5.50)

## 2011-03-24 ENCOUNTER — Ambulatory Visit (INDEPENDENT_AMBULATORY_CARE_PROVIDER_SITE_OTHER): Payer: Medicare HMO | Admitting: Internal Medicine

## 2011-03-24 ENCOUNTER — Encounter: Payer: Self-pay | Admitting: Internal Medicine

## 2011-03-24 VITALS — BP 130/80 | HR 60 | Temp 97.9°F | Resp 16 | Ht 64.0 in | Wt 186.0 lb

## 2011-03-24 DIAGNOSIS — Z0000 Encounter for general adult medical examination without abnormal findings: Secondary | ICD-10-CM

## 2011-03-24 DIAGNOSIS — R635 Abnormal weight gain: Secondary | ICD-10-CM

## 2011-03-24 DIAGNOSIS — I10 Essential (primary) hypertension: Secondary | ICD-10-CM

## 2011-03-24 DIAGNOSIS — T783XXA Angioneurotic edema, initial encounter: Secondary | ICD-10-CM

## 2011-03-24 DIAGNOSIS — Z79890 Hormone replacement therapy (postmenopausal): Secondary | ICD-10-CM

## 2011-03-24 NOTE — Progress Notes (Signed)
Subjective:    Patient ID: Kelly Frazier, female    DOB: Oct 28, 1945, 65 y.o.   MRN: [id number]  HPI Presents for CPX HAS been to see Neurology to differentiate  between carpal tunnel on the left versus C7 cervical disc disease.  Blood pressure is controlled Head ache pattern stable Allergies are stable reviewed labs Has been gaining weight  Review of Systems  Unable to perform ROS Constitutional: Positive for unexpected weight change. Negative for activity change, appetite change and fatigue.  HENT: Negative for ear pain, congestion, neck pain, postnasal drip and sinus pressure.   Eyes: Negative for redness and visual disturbance.  Respiratory: Negative for cough, shortness of breath and wheezing.   Gastrointestinal: Negative for abdominal pain and abdominal distention.  Genitourinary: Negative for dysuria, frequency and menstrual problem.  Musculoskeletal: Negative for myalgias, joint swelling and arthralgias.  Skin: Negative for rash and wound.  Neurological: Negative for dizziness, weakness and headaches.  Hematological: Negative for adenopathy. Does not bruise/bleed easily.  Psychiatric/Behavioral: Negative for sleep disturbance and decreased concentration.  All other systems reviewed and are negative.   Past Medical History  Diagnosis Date  . Headache   . Thyroid disease   . Arthritis   . Hyperlipidemia   . Hypertension   . Hx of colonic polyps   . Diverticulosis    Past Surgical History  Procedure Date  . Colonoscopy 08/17/2006  . Bcca from eyelid   . Colonoscopy 08/17/06  . Axillary abcess irrigation and debridement     reports that she has never smoked. She does not have any smokeless tobacco history on file. She reports that she drinks about 1.2 ounces of alcohol per week. Her drug history not on file. family history includes Alcohol abuse in her father; Cancer in her mother; and Liver disease in her father. Allergies  Allergen Reactions  . Naprosyn  (Naproxen) Swelling  . Terbinafine Hcl Swelling       Objective:   Physical Exam  Nursing note and vitals reviewed. Constitutional: She is oriented to person, place, and time. She appears well-developed and well-nourished. No distress.       obesity  HENT:  Head: Normocephalic and atraumatic.  Right Ear: External ear normal.  Left Ear: External ear normal.  Nose: Nose normal.  Mouth/Throat: Oropharynx is clear and moist.  Eyes: Conjunctivae and EOM are normal. Pupils are equal, round, and reactive to light.  Neck: Normal range of motion. Neck supple. No JVD present. No tracheal deviation present. No thyromegaly present.  Cardiovascular: Normal rate, regular rhythm, normal heart sounds and intact distal pulses.   No murmur heard. Pulmonary/Chest: Effort normal and breath sounds normal. She has no wheezes. She exhibits no tenderness.  Abdominal: Soft. Bowel sounds are normal.  Musculoskeletal: Normal range of motion. She exhibits no edema and no tenderness.  Lymphadenopathy:    She has no cervical adenopathy.  Neurological: She is alert and oriented to person, place, and time. She has normal reflexes. No cranial nerve deficit.  Skin: Skin is warm and dry. She is not diaphoretic.  Psychiatric: She has a normal mood and affect. Her behavior is normal.           Assessment & Plan:   This is a routine physical examination for this healthy  Female. Reviewed all health maintenance protocols including mammography colonoscopy bone density and reviewed appropriate screening labs. Her immunization history was reviewed as well as her current medications and allergies refills of her chronic medications were given and  the plan for yearly health maintenance was discussed all orders and referrals were made as appropriate.  Weight loss counseling   I have spent more than 30 minutes examining this patient face-to-face of which over half was spent in counseling See hand out Lipids at goal on  lipitor Head ache stable Discussed stopping HRT

## 2011-03-24 NOTE — Patient Instructions (Signed)
Calorie Counting Diet A calorie counting diet requires you to eat the number of calories that are right for you during a day. Calories are the measurement of how much energy you get from the food you eat. Eating the right amount of calories is important for staying at a healthy weight. If you eat too many calories your body will store them as fat and you may gain weight. If you eat too few calories you may lose weight. Counting the number of calories that you eat during a day will help you to know if you're eating the right amount. A Registered Dietitian can determine how many calories you need in a day. The amount of calories you need varies from person to person. If your goal is to lose weight you will need to eat fewer calories. Losing weight can benefit you if you are overweight or have health problems such as heart disease, high blood pressure or diabetes. If your goal is to gain weight, you will need to eat more calories. Gaining weight may be necessary if you have a certain health problem that causes your body to need more energy. TIPS Whether you are increasing or decreasing the number of calories you eat during a day, it may be hard to get used to changing what you eat and drink. The following are tips to help you keep track of the number of calories you are eating.  Measuring foods at home with measuring cups will help you to know the actual amount of food and number of calories you are eating.   Restaurants serve food in all different portion sizes. It is common that restaurants will serve food in amounts worth 2 or more serving sizes. While eating out, it may be helpful to estimate how many servings of a food you are given. For example, a serving of cooked rice is 1/2 cup and that is the size of half of a fist. Knowing serving sizes will help you have a better idea of how much food you are eating at restaurants.   Ask for smaller portion sizes or child-size portions at restaurants.   Plan to  eat half of a meal at a restaurant and take the rest home or share the other half with a friend   Read food labels for calorie content and serving size   Most packaged food has a Nutrition Facts Panel on its side or back. Here you can find out how many servings are in a package, the size of a serving, and the number of calories each serving has.   The serving size and number of servings per container are listed right below the Nutrition Facts heading. Just below the serving information, the number of calories in each serving is listed.   For example, say that a package has three cookies inside. The Nutrition Facts panel says that one serving is one cookie. Below that, it says that there are three servings in the container. The calories section of the Nutrition Facts says there are 90 calories. That means that there are 90 calories in one cookie. If you eat one cookie you have eaten 90 calories. If you eat all three cookies, you have eaten three times that amount, or 270 calories.  The list below tells you how big or small some common portion sizes are.  1 ounce (oz).................4 stacked dice.   3 oz.............................Marland KitchenDeck of cards.   1 teaspoon (tsp)..........Marland KitchenTip of little finger.   1 tablespoon (Tbsp).Marland KitchenMarland KitchenMarland KitchenTip of thumb.  2 Tbsp.........................Marland KitchenGolf ball.    Cup.........................Marland KitchenHalf of a fist.   1 Cup..........................Marland KitchenA fist.  KEEP A FOOD LOG Write down every food item that you eat, how much of the food you eat, and the number of calories in each food that you eat during the day. At the end of the day or throughout the day you can add up the total number of calories you have eaten.  It may help to set up a list like the one below. Find out the calorie information by reading food labels.  Breakfast   Bran Flakes (1 cup, 110 calories).   Fat free milk ( cup, 45 calories).   Snack   Apple (1 medium, 80 calories).   Lunch   Spinach (1  cup, 20 calories).   Tomato ( medium, 20 calories).   Chicken breast strips (3 oz, 165 calories).   Shredded cheddar cheese ( cup, 110 calories).   Light [place] dressing (2 Tbsp, 60 calories).   Whole wheat bread (1 slice, 80 calories).   Tub margarine (1 tsp, 35 calories).   Vegetable soup (1 cup, 160 calories).   Dinner   Pork chop (3 oz, 190 calories).   Brown rice (1 cup, 215 calories).   Steamed broccoli ( cup, 20 calories).   Strawberries (1  cup, 65 calories).   Whipped cream (1 Tbsp, 50 calories).  Daily Calorie Total: 1425 Information from www.eatright.org, Foodwise Nutritional Analysis Database. Document Released: 09/01/2005 Document Re-Released: 09/23/2009 [facility] Patient Information 2011 [place], [place].  Weight watchers is a valid alternative

## 2011-04-08 ENCOUNTER — Other Ambulatory Visit: Payer: Self-pay | Admitting: *Deleted

## 2011-04-08 MED ORDER — LEVOTHYROXINE SODIUM 75 MCG PO TABS: 75.0000 ug | ORAL_TABLET | Freq: Every day | ORAL | Status: DC

## 2011-05-07 ENCOUNTER — Other Ambulatory Visit: Payer: Self-pay | Admitting: Internal Medicine

## 2011-05-07 MED ORDER — LEVONORGESTREL-ETHINYL ESTRAD 90-20 MCG PO TABS: 1.0000 | ORAL_TABLET | Freq: Every day | ORAL | Status: DC

## 2011-05-07 MED ORDER — RABEPRAZOLE SODIUM 20 MG PO TBEC: 20.0000 mg | DELAYED_RELEASE_TABLET | Freq: Every day | ORAL | Status: DC

## 2011-06-23 ENCOUNTER — Ambulatory Visit (INDEPENDENT_AMBULATORY_CARE_PROVIDER_SITE_OTHER): Payer: Medicare HMO | Admitting: Internal Medicine

## 2011-06-23 ENCOUNTER — Encounter: Payer: Self-pay | Admitting: Internal Medicine

## 2011-06-23 VITALS — BP 110/70 | HR 72 | Temp 98.3°F | Resp 16 | Ht 64.0 in | Wt 183.0 lb

## 2011-06-23 DIAGNOSIS — M1990 Unspecified osteoarthritis, unspecified site: Secondary | ICD-10-CM

## 2011-06-23 DIAGNOSIS — Z23 Encounter for immunization: Secondary | ICD-10-CM

## 2011-06-23 DIAGNOSIS — K219 Gastro-esophageal reflux disease without esophagitis: Secondary | ICD-10-CM

## 2011-06-23 DIAGNOSIS — R51 Headache: Secondary | ICD-10-CM

## 2011-06-23 DIAGNOSIS — I10 Essential (primary) hypertension: Secondary | ICD-10-CM

## 2011-06-23 NOTE — Patient Instructions (Signed)
Patient was instructed to continue all medications as prescribed. To stop at the checkout desk and schedule a followup appointment  

## 2011-06-23 NOTE — Progress Notes (Signed)
  Subjective:    Patient ID: Kelly Frazier, female    DOB: 15-Apr-1946, 65 y.o.   MRN: [id number]  HPI  Follow up HTN, GERD, hypothyroid, migraine hx, Discussion of the medications Allergies asthma     Review of Systems  Constitutional: Negative for activity change, appetite change and fatigue.  HENT: Negative for ear pain, congestion, neck pain, postnasal drip and sinus pressure.   Eyes: Negative for redness and visual disturbance.  Respiratory: Negative for cough, shortness of breath and wheezing.   Gastrointestinal: Negative for abdominal pain and abdominal distention.  Genitourinary: Negative for dysuria, frequency and menstrual problem.  Musculoskeletal: Negative for myalgias, joint swelling and arthralgias.  Skin: Negative for rash and wound.  Neurological: Negative for dizziness, weakness and headaches.  Hematological: Negative for adenopathy. Does not bruise/bleed easily.  Psychiatric/Behavioral: Negative for sleep disturbance and decreased concentration.   Past Medical History  Diagnosis Date  . Headache   . Thyroid disease   . Arthritis   . Hyperlipidemia   . Hypertension   . Hx of colonic polyps   . Diverticulosis    Past Surgical History  Procedure Date  . Colonoscopy 08/17/2006  . Bcca from eyelid   . Colonoscopy 08/17/06  . Axillary abcess irrigation and debridement     reports that she has never smoked. She does not have any smokeless tobacco history on file. She reports that she drinks about 1.2 ounces of alcohol per week. Her drug history not on file. family history includes Alcohol abuse in her father; Cancer in her mother; and Liver disease in her father. Allergies  Allergen Reactions  . Naprosyn (Naproxen) Swelling  . Terbinafine Hcl Swelling       Objective:   Physical Exam  Nursing note and vitals reviewed. Constitutional: She is oriented to person, place, and time. She appears well-developed and well-nourished. No distress.  HENT:  Head:  Normocephalic and atraumatic.  Right Ear: External ear normal.  Left Ear: External ear normal.  Nose: Nose normal.  Mouth/Throat: Oropharynx is clear and moist.  Eyes: Conjunctivae and EOM are normal. Pupils are equal, round, and reactive to light.  Neck: Normal range of motion. Neck supple. No JVD present. No tracheal deviation present. No thyromegaly present.  Cardiovascular: Normal rate, regular rhythm, normal heart sounds and intact distal pulses.   No murmur heard. Pulmonary/Chest: Effort normal and breath sounds normal. She has no wheezes. She exhibits no tenderness.  Abdominal: Soft. Bowel sounds are normal.  Musculoskeletal: Normal range of motion. She exhibits no edema and no tenderness.  Lymphadenopathy:    She has no cervical adenopathy.  Neurological: She is alert and oriented to person, place, and time. She has normal reflexes. No cranial nerve deficit.  Skin: Skin is warm and dry. She is not diaphoretic.  Psychiatric: She has a normal mood and affect. Her behavior is normal.          Assessment & Plan:  In a exercise and weight loss   Stable Migraine pattern Stable HTN Stable GERD

## 2011-07-12 ENCOUNTER — Other Ambulatory Visit: Payer: Self-pay | Admitting: Internal Medicine

## 2011-07-28 ENCOUNTER — Telehealth: Payer: Self-pay | Admitting: *Deleted

## 2011-07-28 NOTE — Telephone Encounter (Signed)
Pt would like to have Dr. Lovell Sheehan recommend a Podiatrist for problems with her feet?

## 2011-07-28 NOTE — Telephone Encounter (Signed)
Told dr Harriet Pho

## 2011-08-19 ENCOUNTER — Other Ambulatory Visit: Payer: Self-pay | Admitting: Internal Medicine

## 2011-08-19 DIAGNOSIS — Z1231 Encounter for screening mammogram for malignant neoplasm of breast: Secondary | ICD-10-CM

## 2011-08-28 ENCOUNTER — Ambulatory Visit
Admission: RE | Admit: 2011-08-28 | Discharge: 2011-08-28 | Disposition: A | Discharge status: Home / Self Care | Payer: Medicare HMO | Source: Ambulatory Visit | Attending: Internal Medicine | Admitting: Internal Medicine

## 2011-08-28 DIAGNOSIS — Z1231 Encounter for screening mammogram for malignant neoplasm of breast: Secondary | ICD-10-CM

## 2011-09-05 ENCOUNTER — Other Ambulatory Visit: Payer: Self-pay | Admitting: Obstetrics and Gynecology

## 2011-09-05 ENCOUNTER — Other Ambulatory Visit (HOSPITAL_COMMUNITY)
Admission: RE | Admit: 2011-09-05 | Discharge: 2011-09-05 | Disposition: A | Discharge status: Home / Self Care | Payer: Managed Care, Other (non HMO) | Source: Ambulatory Visit | Attending: Obstetrics and Gynecology | Admitting: Obstetrics and Gynecology

## 2011-09-05 DIAGNOSIS — Z124 Encounter for screening for malignant neoplasm of cervix: Secondary | ICD-10-CM | POA: Insufficient documentation

## 2011-09-22 ENCOUNTER — Ambulatory Visit: Payer: Medicare HMO | Admitting: Internal Medicine

## 2012-04-15 ENCOUNTER — Other Ambulatory Visit: Payer: Self-pay | Admitting: Internal Medicine

## 2012-04-15 ENCOUNTER — Other Ambulatory Visit: Payer: Self-pay | Admitting: *Deleted

## 2012-04-15 MED ORDER — ATORVASTATIN CALCIUM 10 MG PO TABS: 10.0000 mg | ORAL_TABLET | Freq: Every day | ORAL | Status: DC

## 2012-04-21 ENCOUNTER — Other Ambulatory Visit: Payer: Self-pay | Admitting: *Deleted

## 2012-04-21 MED ORDER — LEVOTHYROXINE SODIUM 75 MCG PO TABS: 75.0000 ug | ORAL_TABLET | Freq: Every day | ORAL | Status: DC

## 2012-05-18 ENCOUNTER — Other Ambulatory Visit: Payer: Self-pay | Admitting: Internal Medicine

## 2012-05-26 ENCOUNTER — Other Ambulatory Visit: Payer: Self-pay | Admitting: *Deleted

## 2012-05-26 MED ORDER — RABEPRAZOLE SODIUM 20 MG PO TBEC: 20.0000 mg | DELAYED_RELEASE_TABLET | Freq: Every day | ORAL | Status: DC

## 2012-05-31 ENCOUNTER — Telehealth: Payer: Self-pay | Admitting: Internal Medicine

## 2012-05-31 MED ORDER — LEVONORGESTREL-ETHINYL ESTRAD 90-20 MCG PO TABS: 1.0000 | ORAL_TABLET | Freq: Every day | ORAL | Status: DC

## 2012-05-31 NOTE — Telephone Encounter (Signed)
Caller: [name]/Patient; Patient Name: Kelly Frazier; PCP: Darryll Capers (Adults only); Best Callback Phone Number: [phone]. Caller requesting a one month supply of her Amethyst that she takes as migraine preventative. Caller reports a problem getting this medication from [place] and having one or two pills left.  Pharmacy is [facility] on [address] in [place]. Caller reports someone from [facility] will be contacting office re same. Caller advised a note will be sent re this issue. She can be reached at above #. She would like a callback to advise when Rx has been called in.

## 2012-05-31 NOTE — Telephone Encounter (Signed)
Sent in 1 month-pt informed--

## 2012-06-01 ENCOUNTER — Telehealth: Payer: Self-pay | Admitting: Family Medicine

## 2012-06-01 NOTE — Telephone Encounter (Signed)
[employer] is stating they never received the rx sent in for levonorgestrel-ethinyl estradiol (AMETHYST) 90-20 MCG tablet Pt requesting you resend. Thanks.

## 2012-06-01 NOTE — Telephone Encounter (Signed)
pt states [employer] found refill

## 2012-07-23 ENCOUNTER — Ambulatory Visit: Payer: Managed Care, Other (non HMO) | Admitting: *Deleted

## 2012-07-23 ENCOUNTER — Other Ambulatory Visit: Payer: Self-pay | Admitting: Internal Medicine

## 2012-08-05 ENCOUNTER — Other Ambulatory Visit: Payer: Self-pay | Admitting: Internal Medicine

## 2012-08-05 DIAGNOSIS — Z1231 Encounter for screening mammogram for malignant neoplasm of breast: Secondary | ICD-10-CM

## 2012-08-16 ENCOUNTER — Telehealth: Payer: Self-pay | Admitting: Internal Medicine

## 2012-08-16 MED ORDER — RABEPRAZOLE SODIUM 20 MG PO TBEC: 20.0000 mg | DELAYED_RELEASE_TABLET | Freq: Every day | ORAL | Status: DC

## 2012-08-16 NOTE — Telephone Encounter (Signed)
Current patient is covered by Kelly Frazier, but as of 09/15/2012 she will give this up and take out a [employer] policy for 1 month before going on Medicare.  While she still has her insurance she is requesting a 90 day supply of the generic for Aciphex 20 mg  (Rabeprazole) to be sent to [facility] at [phone] if this is possible.  Please follow up with patient regarding this. Thanks

## 2012-08-16 NOTE — Telephone Encounter (Signed)
rx faxed to pharmacy, pt aware 

## 2012-08-24 ENCOUNTER — Other Ambulatory Visit: Payer: Self-pay | Admitting: *Deleted

## 2012-08-24 MED ORDER — RABEPRAZOLE SODIUM 20 MG PO TBEC: 20.0000 mg | DELAYED_RELEASE_TABLET | Freq: Every day | ORAL | Status: DC

## 2012-08-24 MED ORDER — LEVONORGESTREL-ETHINYL ESTRAD 0.1-20 MG-MCG PO TABS: ORAL_TABLET | ORAL | Status: DC

## 2012-08-27 ENCOUNTER — Other Ambulatory Visit: Payer: Self-pay | Admitting: *Deleted

## 2012-08-27 MED ORDER — LEVONORGESTREL-ETHINYL ESTRAD 0.1-20 MG-MCG PO TABS: ORAL_TABLET | ORAL | Status: DC

## 2012-09-14 ENCOUNTER — Ambulatory Visit
Admission: RE | Admit: 2012-09-14 | Discharge: 2012-09-14 | Disposition: A | Discharge status: Home / Self Care | Payer: Managed Care, Other (non HMO) | Source: Ambulatory Visit | Attending: Internal Medicine | Admitting: Internal Medicine

## 2012-09-14 DIAGNOSIS — Z1231 Encounter for screening mammogram for malignant neoplasm of breast: Secondary | ICD-10-CM

## 2012-10-18 ENCOUNTER — Ambulatory Visit (INDEPENDENT_AMBULATORY_CARE_PROVIDER_SITE_OTHER): Payer: Medicare Other | Admitting: Internal Medicine

## 2012-10-18 ENCOUNTER — Encounter: Payer: Self-pay | Admitting: Internal Medicine

## 2012-10-18 VITALS — BP 130/86 | HR 68 | Temp 98.2°F | Resp 18 | Wt 180.0 lb

## 2012-10-18 DIAGNOSIS — T887XXA Unspecified adverse effect of drug or medicament, initial encounter: Secondary | ICD-10-CM | POA: Diagnosis not present

## 2012-10-18 DIAGNOSIS — I10 Essential (primary) hypertension: Secondary | ICD-10-CM

## 2012-10-18 DIAGNOSIS — E785 Hyperlipidemia, unspecified: Secondary | ICD-10-CM

## 2012-10-18 DIAGNOSIS — R51 Headache: Secondary | ICD-10-CM

## 2012-10-18 DIAGNOSIS — K219 Gastro-esophageal reflux disease without esophagitis: Secondary | ICD-10-CM

## 2012-10-18 LAB — BASIC METABOLIC PANEL
BUN: 12 mg/dL (ref 6–23)
CO2: 26 mEq/L (ref 19–32)
Calcium: 8.7 mg/dL (ref 8.4–10.5)
Chloride: 106 mEq/L (ref 96–112)
Creatinine, Ser: 0.8 mg/dL (ref 0.4–1.2)
GFR: 81.18 mL/min (ref >60.00)
Glucose, Bld: 83 mg/dL (ref 70–99)
Potassium: 3.8 mEq/L (ref 3.5–5.1)
Sodium: 139 mEq/L (ref 135–145)

## 2012-10-18 LAB — TSH: TSH: 3.81 u[IU]/mL (ref 0.35–5.50)

## 2012-10-18 LAB — LIPID PANEL
Cholesterol: 166 mg/dL (ref 0–200)
HDL: 34.7 mg/dL — ABNORMAL LOW (ref >39.00)
LDL Cholesterol: 118 mg/dL — ABNORMAL HIGH (ref 0–99)
Total CHOL/HDL Ratio: 5
Triglycerides: 66 mg/dL (ref 0.0–149.0)
VLDL: 13.2 mg/dL (ref 0.0–40.0)

## 2012-10-18 LAB — HEPATIC FUNCTION PANEL
ALT: 40 U/L — ABNORMAL HIGH (ref 0–35)
AST: 29 U/L (ref 0–37)
Albumin: 3.7 g/dL (ref 3.5–5.2)
Alkaline Phosphatase: 52 U/L (ref 39–117)
Bilirubin, Direct: 0.1 mg/dL (ref 0.0–0.3)
Total Bilirubin: 0.5 mg/dL (ref 0.3–1.2)
Total Protein: 7.1 g/dL (ref 6.0–8.3)

## 2012-10-18 NOTE — Patient Instructions (Addendum)
The patient is instructed to continue all medications as prescribed. Schedule followup with check out clerk upon leaving the clinic  

## 2012-10-18 NOTE — Progress Notes (Signed)
Subjective:    Patient ID: Kelly Frazier, female    DOB: 1945-11-24, 67 y.o.   MRN: [id number]  HPI Head ache pattern stable Blood pressure stable Weather related sinus drainage ( allergies) Sleep... Not taking the benadryl for sleep and allergies  Taking the generics for lipitor  For lipids begain medicare   Review of Systems  Constitutional: Negative for activity change, appetite change and fatigue.  HENT: Negative for ear pain, congestion, neck pain, postnasal drip and sinus pressure.   Eyes: Negative for redness and visual disturbance.  Respiratory: Negative for cough, shortness of breath and wheezing.   Gastrointestinal: Negative for abdominal pain and abdominal distention.  Genitourinary: Negative for dysuria, frequency and menstrual problem.  Musculoskeletal: Negative for myalgias, joint swelling and arthralgias.  Skin: Negative for rash and wound.  Neurological: Negative for dizziness, weakness and headaches.  Hematological: Negative for adenopathy. Does not bruise/bleed easily.  Psychiatric/Behavioral: Negative for sleep disturbance and decreased concentration.       Past Medical History  Diagnosis Date  . Headache   . Thyroid disease   . Arthritis   . Hyperlipidemia   . Hypertension   . Hx of colonic polyps   . Diverticulosis     History   Social History  . Marital Status: Single    Spouse Name: N/A    Number of Children: N/A  . Years of Education: N/A   Occupational History  . Not on file.   Social History Main Topics  . Smoking status: Never Smoker   . Smokeless tobacco: Not on file  . Alcohol Use: 1.2 oz/week    2 Glasses of wine per week  . Drug Use: Not on file  . Sexually Active: Yes   Other Topics Concern  . Not on file   Social History Narrative  . No narrative on file    Past Surgical History  Procedure Date  . Colonoscopy 08/17/2006  . Bcca from eyelid   . Colonoscopy 08/17/06  . Axillary abcess irrigation and debridement      Family History  Problem Relation Age of Onset  . Cancer Mother     lung  . Liver disease Father   . Alcohol abuse Father     Allergies  Allergen Reactions  . Naprosyn (Naproxen) Swelling  . Terbinafine Hcl Swelling    Current Outpatient Prescriptions on File Prior to Visit  Medication Sig Dispense Refill  . Alum & Mag Hydroxide-Simeth (MAALOX PLUS PO) Take by mouth as needed.        Marland Kitchen atorvastatin (LIPITOR) 10 MG tablet Take 1 tablet (10 mg total) by mouth daily.  90 tablet  3  . bisoprolol-hydrochlorothiazide (ZIAC) 2.5-6.25 MG per tablet TAKE ONE TABLET BY MOUTH ONE TIME DAILY  30 tablet  10  . calcium carbonate 200 MG capsule Take 250 mg by mouth 2 (two) times daily with a meal.        . eletriptan (RELPAX) 40 MG tablet One tablet by mouth as needed for migraine headache.  If the headache improves and then returns, dose may be repeated after 2 hours have elapsed since first dose (do not exceed 80 mg per day). may repeat in 2 hours if necessary       . EPINEPHrine (EPIPEN JR) 0.15 MG/0.3ML injection Inject 0.15 mg into the muscle as needed.        Marland Kitchen ibuprofen (ADVIL,MOTRIN) 200 MG tablet Take 200 mg by mouth every 6 (six) hours as needed.        Marland Kitchen  levonorgestrel-ethinyl estradiol (AVIANE) 0.1-20 MG-MCG tablet Take 1 tab daily- will take 4 active packs monthly--this is being changed due to your backorder of amethyst--this is to correct first sig-  12 Package  3  . levothyroxine (SYNTHROID, LEVOTHROID) 75 MCG tablet Take 1 tablet (75 mcg total) by mouth daily.  90 tablet  3  . loratadine (CLARITIN) 10 MG tablet Take 10 mg by mouth daily.        . multivitamin (THERAGRAN) per tablet Take 1 tablet by mouth daily.        . RABEprazole (ACIPHEX) 20 MG tablet Take 1 tablet (20 mg total) by mouth daily.  90 tablet  3    BP 130/86  Pulse 68  Temp 98.2 F (36.8 C) (Oral)  Resp 18  Wt 180 lb (81.647 kg)  SpO2 97%    Objective:   Physical Exam  Nursing note and vitals  reviewed. Constitutional: She appears well-developed and well-nourished.  HENT:  Head: Normocephalic and atraumatic.  Eyes: Conjunctivae normal are normal. Pupils are equal, round, and reactive to light.  Cardiovascular: Normal rate and regular rhythm.   Pulmonary/Chest: Effort normal.  Abdominal: Soft. Bowel sounds are normal.          Assessment & Plan:  cataract surgery Stable HTN Stable migraines Follow up welcome to medicare

## 2012-10-19 ENCOUNTER — Other Ambulatory Visit: Payer: Self-pay | Admitting: *Deleted

## 2012-10-19 MED ORDER — LEVOTHYROXINE SODIUM 75 MCG PO TABS: 75.0000 ug | ORAL_TABLET | Freq: Every day | ORAL | Status: DC

## 2012-10-19 MED ORDER — BISOPROLOL-HYDROCHLOROTHIAZIDE 2.5-6.25 MG PO TABS: 1.0000 | ORAL_TABLET | Freq: Every day | ORAL | Status: DC

## 2012-10-19 MED ORDER — ATORVASTATIN CALCIUM 10 MG PO TABS: 10.0000 mg | ORAL_TABLET | Freq: Every day | ORAL | Status: DC

## 2012-10-19 MED ORDER — RABEPRAZOLE SODIUM 20 MG PO TBEC: 20.0000 mg | DELAYED_RELEASE_TABLET | Freq: Every day | ORAL | Status: DC

## 2012-11-23 DIAGNOSIS — H524 Presbyopia: Secondary | ICD-10-CM | POA: Diagnosis not present

## 2012-11-23 DIAGNOSIS — H2510 Age-related nuclear cataract, unspecified eye: Secondary | ICD-10-CM | POA: Diagnosis not present

## 2012-12-22 DIAGNOSIS — H2510 Age-related nuclear cataract, unspecified eye: Secondary | ICD-10-CM | POA: Diagnosis not present

## 2012-12-29 DIAGNOSIS — H2589 Other age-related cataract: Secondary | ICD-10-CM | POA: Diagnosis not present

## 2012-12-29 DIAGNOSIS — H2510 Age-related nuclear cataract, unspecified eye: Secondary | ICD-10-CM | POA: Diagnosis not present

## 2012-12-29 DIAGNOSIS — H269 Unspecified cataract: Secondary | ICD-10-CM | POA: Diagnosis not present

## 2013-01-12 DIAGNOSIS — H2510 Age-related nuclear cataract, unspecified eye: Secondary | ICD-10-CM | POA: Diagnosis not present

## 2013-01-18 DIAGNOSIS — H2510 Age-related nuclear cataract, unspecified eye: Secondary | ICD-10-CM | POA: Diagnosis not present

## 2013-01-24 DIAGNOSIS — H2589 Other age-related cataract: Secondary | ICD-10-CM | POA: Diagnosis not present

## 2013-01-24 DIAGNOSIS — H2510 Age-related nuclear cataract, unspecified eye: Secondary | ICD-10-CM | POA: Diagnosis not present

## 2013-01-24 DIAGNOSIS — H269 Unspecified cataract: Secondary | ICD-10-CM | POA: Diagnosis not present

## 2013-02-09 ENCOUNTER — Telehealth: Payer: Self-pay | Admitting: Internal Medicine

## 2013-02-09 NOTE — Telephone Encounter (Addendum)
Pt has switched to Medicare for her insurance. Pt would like to change her meds info: [employer] Medicare RX no:  [phone] Erven Colla ID no:  [id number] Pt states they will give MD a fax number to send scripts into once we call in. Pt needs refill:   levonorgestrel-ethinyl estradiol (AVIANE) 0.1-20 MG-MCG tablet 4 packs = one 90 day supply.   (16 packs= one yr supply)

## 2013-02-09 NOTE — Telephone Encounter (Signed)
Kelly Frazier, will you put infor in chart about insurance and then send med refills back to me and I will take care of

## 2013-02-10 ENCOUNTER — Other Ambulatory Visit: Payer: Self-pay | Admitting: *Deleted

## 2013-02-10 MED ORDER — LEVONORGESTREL-ETHINYL ESTRAD 0.1-20 MG-MCG PO TABS: ORAL_TABLET | ORAL | Status: DC

## 2013-02-10 NOTE — Telephone Encounter (Signed)
[employer]- medicare not listed , but when number that was give to me by pt was called it is [place] [facility],there med was sent e-chart to [facility].

## 2013-02-28 ENCOUNTER — Telehealth: Payer: Self-pay | Admitting: Internal Medicine

## 2013-02-28 MED ORDER — LEVONORGESTREL-ETHINYL ESTRAD 0.1-20 MG-MCG PO TABS: 1.0000 | ORAL_TABLET | Freq: Every day | ORAL | Status: DC

## 2013-02-28 MED ORDER — LEVONORGESTREL-ETHINYL ESTRAD 0.1-20 MG-MCG PO TABS: ORAL_TABLET | ORAL | Status: DC

## 2013-02-28 NOTE — Telephone Encounter (Signed)
Pt wants you to know that [facility] will be faxing rx request for generic AVIANE. She needs four pacls, not 3, because she does not take the placebo week. States we made need to get in touch with the mail order pharm to say that 4 packs is needed.

## 2013-02-28 NOTE — Telephone Encounter (Signed)
[employer] and gave then order and called to phamacy on [address] [facility] [place]-pt informed

## 2013-02-28 NOTE — Telephone Encounter (Signed)
Pt states she is almost out, and mail order will not arrive soon enough. Wants a 30-day supply sent to [facility] [address] in [place].

## 2013-04-06 ENCOUNTER — Encounter: Payer: Medicare Other | Admitting: Internal Medicine

## 2013-04-08 ENCOUNTER — Encounter: Payer: Medicare Other | Admitting: Internal Medicine

## 2013-06-13 ENCOUNTER — Telehealth: Payer: Self-pay | Admitting: Internal Medicine

## 2013-06-13 NOTE — Telephone Encounter (Signed)
Patient Information:  Caller Name: Aeon  Phone: [phone]  Patient: Kelly Frazier, Kelly Frazier  Gender: Female  DOB: Aug 04, 1946  Age: 67 Years  PCP: Darryll Capers (Adults only)  Office Follow Up:  Does the office need to follow up with this patient?: No  Instructions For The Office: N/A  RN Note:  Had shingles shot.  Noted more fatigued lately but also doing more physical activity than unsual. Requests appointment for 06/14/13.  Symptoms  Reason For Call & Symptoms: Concerned about possible shingles due to recurrent, intermittently itchy, papular rash on right collarbone for past 3 weeks.  Has up to 4 red papules without blistering, drainage, scabbing,  or pain.  Reviewed Health History In EMR: Yes  Reviewed Medications In EMR: Yes  Reviewed Allergies In EMR: Yes  Reviewed Surgeries / Procedures: Yes  Date of Onset of Symptoms: 05/23/2013  Treatments Tried: Neosporin, Rubbing Alcohol, Benadryl gel  Treatments Tried Worked: Yes  Guideline(s) Used:  Rash or Redness - Localized  Disposition Per Guideline:   See Within 3 Days in Office  Reason For Disposition Reached:   Localized rash present > 7 days  Advice Given:  Avoid Soap:  Wash the area once thoroughly with soap to remove any remaining irritants. Thereafter avoid soaps to this area. Cleanse the area when needed with warm water.  Apply Cold to the Area:  Apply or soak in cold water for 20 minutes every 3 to 4 hours to reduce itching or pain.  Hydrocortisone Cream for Itching:   Keep the cream in the refrigerator (Reason: it feels better if applied cold).  Avoid Scratching:  Try not to scratch. Cut your fingernails short.  Contagiousness:  Adults with localized rashes do not need to miss any work or school.  Call Back If:  Rash spreads or becomes worse  You become worse.  Patient Will Follow Care Advice:  YES  Appointment Scheduled:  06/14/2013 08:00:00 Appointment Scheduled Provider:  Selena Batten (TEXT 1st, after 20 mins  can call), Dahlia Client [facility])

## 2013-06-14 ENCOUNTER — Ambulatory Visit (INDEPENDENT_AMBULATORY_CARE_PROVIDER_SITE_OTHER): Payer: Medicare Other | Admitting: Family Medicine

## 2013-06-14 ENCOUNTER — Encounter: Payer: Self-pay | Admitting: Family Medicine

## 2013-06-14 VITALS — Temp 98.3°F | Wt 185.0 lb

## 2013-06-14 DIAGNOSIS — Z23 Encounter for immunization: Secondary | ICD-10-CM | POA: Diagnosis not present

## 2013-06-14 DIAGNOSIS — L989 Disorder of the skin and subcutaneous tissue, unspecified: Secondary | ICD-10-CM

## 2013-06-14 NOTE — Progress Notes (Signed)
Chief Complaint  Patient presents with  . Rash    HPI:  Acute visit for rash: -few itchy bumps on chest and back for 2-3 weeks -same bumps and she has been scratching them -no pain -denies fevers, chills, pain, malaise, known new exposures or insect bites -she wondered if this could be shingles -has tried neosporin and benadryl and a little better but still itchy  ROS: See pertinent positives and negatives per HPI.  Past Medical History  Diagnosis Date  . Headache(784.0)   . Thyroid disease   . Arthritis   . Hyperlipidemia   . Hypertension   . Hx of colonic polyps   . Diverticulosis     Past Surgical History  Procedure Laterality Date  . Colonoscopy  08/17/2006  . Bcca from eyelid    . Colonoscopy  08/17/06  . Axillary abcess irrigation and debridement      Family History  Problem Relation Age of Onset  . Cancer Mother     lung  . Liver disease Father   . Alcohol abuse Father     History   Social History  . Marital Status: Single    Spouse Name: N/A    Number of Children: N/A  . Years of Education: N/A   Social History Main Topics  . Smoking status: Never Smoker   . Smokeless tobacco: None  . Alcohol Use: 1.2 oz/week    2 Glasses of wine per week  . Drug Use: None  . Sexual Activity: Yes   Other Topics Concern  . None   Social History Narrative  . None    Current outpatient prescriptions:Alum & Mag Hydroxide-Simeth (MAALOX PLUS PO), Take by mouth as needed.  , Disp: , Rfl: ;  atorvastatin (LIPITOR) 10 MG tablet, Take 1 tablet (10 mg total) by mouth daily., Disp: 90 tablet, Rfl: 3;  bisoprolol-hydrochlorothiazide (ZIAC) 2.5-6.25 MG per tablet, Take 1 tablet by mouth daily., Disp: 90 tablet, Rfl: 3 calcium carbonate 200 MG capsule, Take 250 mg by mouth 2 (two) times daily with a meal.  , Disp: , Rfl: ;  eletriptan (RELPAX) 40 MG tablet, One tablet by mouth as needed for migraine headache.  If the headache improves and then returns, dose may be repeated  after 2 hours have elapsed since first dose (do not exceed 80 mg per day). may repeat in 2 hours if necessary , Disp: , Rfl:  EPINEPHrine (EPIPEN JR) 0.15 MG/0.3ML injection, Inject 0.15 mg into the muscle as needed.  , Disp: , Rfl: ;  ibuprofen (ADVIL,MOTRIN) 200 MG tablet, Take 200 mg by mouth every 6 (six) hours as needed.  , Disp: , Rfl: ;  levonorgestrel-ethinyl estradiol (AVIANE) 0.1-20 MG-MCG tablet, Take 1 tab daily- will take 4 active packs monthly--this is being changed due to your backorder of amethyst--this is to correct first sig-, Disp: 4 Package, Rfl: 1 levonorgestrel-ethinyl estradiol (AVIANE,ALESSE,LESSINA) 0.1-20 MG-MCG tablet, Take 1 tablet by mouth daily., Disp: 12 Package, Rfl: 3;  levothyroxine (SYNTHROID, LEVOTHROID) 75 MCG tablet, Take 1 tablet (75 mcg total) by mouth daily., Disp: 90 tablet, Rfl: 3;  loratadine (CLARITIN) 10 MG tablet, Take 10 mg by mouth daily.  , Disp: , Rfl: ;  multivitamin (THERAGRAN) per tablet, Take 1 tablet by mouth daily.  , Disp: , Rfl:  RABEprazole (ACIPHEX) 20 MG tablet, Take 1 tablet (20 mg total) by mouth daily., Disp: 90 tablet, Rfl: 3  EXAM:  Filed Vitals:   06/14/13 0809  Temp: 98.3 F (36.8 C)  Body mass index is 31.74 kg/(m^2).  GENERAL: vitals reviewed and listed above, alert, oriented, appears well hydrated and in no acute distress  HEENT: atraumatic, conjunttiva clear, no obvious abnormalities on inspection of external nose and ears  NECK: no obvious masses on inspection  SKIN: 3 healing erythematous papules on chest and one on upper back  MS: moves all extremities without noticeable abnormality  PSYCH: pleasant and cooperative, no obvious depression or anxiety  ASSESSMENT AND PLAN:  Discussed the following assessment and plan:  Need for prophylactic vaccination and inoculation against influenza - Plan: Flu Vaccine QUAD 36+ mos PF IM (Fluarix)  Skin lesion  -looks like insect bites -advised topical steroid (triam  0.1% cr - written rx as computer frozen at time)and follow up if persists/derm numbers also provided if can not get in with PCP -Patient advised to return or notify a doctor immediately if symptoms worsen or persist or new concerns arise.  There are no Patient Instructions on file for this visit.   Kriste Basque R.

## 2013-08-15 ENCOUNTER — Other Ambulatory Visit: Payer: Self-pay | Admitting: Internal Medicine

## 2013-08-24 ENCOUNTER — Other Ambulatory Visit: Payer: Self-pay

## 2013-08-24 ENCOUNTER — Other Ambulatory Visit: Payer: Self-pay | Admitting: Internal Medicine

## 2013-08-24 DIAGNOSIS — Z1231 Encounter for screening mammogram for malignant neoplasm of breast: Secondary | ICD-10-CM

## 2013-08-30 DIAGNOSIS — Z01419 Encounter for gynecological examination (general) (routine) without abnormal findings: Secondary | ICD-10-CM | POA: Diagnosis not present

## 2013-08-30 DIAGNOSIS — Z124 Encounter for screening for malignant neoplasm of cervix: Secondary | ICD-10-CM | POA: Diagnosis not present

## 2013-08-30 DIAGNOSIS — Z0000 Encounter for general adult medical examination without abnormal findings: Secondary | ICD-10-CM | POA: Diagnosis not present

## 2013-09-14 ENCOUNTER — Encounter: Payer: Self-pay | Admitting: Internal Medicine

## 2013-09-14 ENCOUNTER — Ambulatory Visit (INDEPENDENT_AMBULATORY_CARE_PROVIDER_SITE_OTHER): Payer: Medicare Other | Admitting: Internal Medicine

## 2013-09-14 VITALS — BP 130/80 | HR 76 | Temp 98.2°F | Resp 16 | Ht 64.0 in | Wt 183.0 lb

## 2013-09-14 DIAGNOSIS — Z23 Encounter for immunization: Secondary | ICD-10-CM

## 2013-09-14 DIAGNOSIS — I10 Essential (primary) hypertension: Secondary | ICD-10-CM | POA: Diagnosis not present

## 2013-09-14 DIAGNOSIS — E785 Hyperlipidemia, unspecified: Secondary | ICD-10-CM

## 2013-09-14 DIAGNOSIS — T887XXA Unspecified adverse effect of drug or medicament, initial encounter: Secondary | ICD-10-CM

## 2013-09-14 DIAGNOSIS — Z0000 Encounter for general adult medical examination without abnormal findings: Secondary | ICD-10-CM

## 2013-09-14 LAB — BASIC METABOLIC PANEL
BUN: 9 mg/dL (ref 6–23)
CO2: 25 mEq/L (ref 19–32)
Calcium: 8.6 mg/dL (ref 8.4–10.5)
Chloride: 105 mEq/L (ref 96–112)
Creatinine, Ser: 0.7 mg/dL (ref 0.4–1.2)
GFR: 89.01 mL/min (ref >60.00)
Glucose, Bld: 93 mg/dL (ref 70–99)
Potassium: 3.6 mEq/L (ref 3.5–5.1)
Sodium: 137 mEq/L (ref 135–145)

## 2013-09-14 LAB — CBC WITH DIFFERENTIAL/PLATELET
Basophils Absolute: 0.1 10*3/uL (ref 0.0–0.1)
Basophils Relative: 0.6 % (ref 0.0–3.0)
Eosinophils Absolute: 0.2 10*3/uL (ref 0.0–0.7)
Eosinophils Relative: 1.5 % (ref 0.0–5.0)
HCT: 45.1 % (ref 36.0–46.0)
Hemoglobin: 15.3 g/dL — ABNORMAL HIGH (ref 12.0–15.0)
Lymphocytes Relative: 27.2 % (ref 12.0–46.0)
Lymphs Abs: 2.9 10*3/uL (ref 0.7–4.0)
MCHC: 33.8 g/dL (ref 30.0–36.0)
MCV: 93 fl (ref 78.0–100.0)
Monocytes Absolute: 0.4 10*3/uL (ref 0.1–1.0)
Monocytes Relative: 4 % (ref 3.0–12.0)
Neutro Abs: 7.2 10*3/uL (ref 1.4–7.7)
Neutrophils Relative %: 66.7 % (ref 43.0–77.0)
Platelets: 310 10*3/uL (ref 150.0–400.0)
RBC: 4.85 Mil/uL (ref 3.87–5.11)
RDW: 15.2 % — ABNORMAL HIGH (ref 11.5–14.6)
WBC: 10.8 10*3/uL — ABNORMAL HIGH (ref 4.5–10.5)

## 2013-09-14 LAB — HEPATIC FUNCTION PANEL
ALT: 37 U/L — ABNORMAL HIGH (ref 0–35)
AST: 28 U/L (ref 0–37)
Albumin: 4 g/dL (ref 3.5–5.2)
Alkaline Phosphatase: 49 U/L (ref 39–117)
Bilirubin, Direct: 0 mg/dL (ref 0.0–0.3)
Total Bilirubin: 0.5 mg/dL (ref 0.3–1.2)
Total Protein: 7.1 g/dL (ref 6.0–8.3)

## 2013-09-14 LAB — LIPID PANEL
Cholesterol: 168 mg/dL (ref 0–200)
HDL: 40.2 mg/dL (ref >39.00)
LDL Cholesterol: 108 mg/dL — ABNORMAL HIGH (ref 0–99)
Total CHOL/HDL Ratio: 4
Triglycerides: 98 mg/dL (ref 0.0–149.0)
VLDL: 19.6 mg/dL (ref 0.0–40.0)

## 2013-09-14 LAB — TSH: TSH: 1.22 u[IU]/mL (ref 0.35–5.50)

## 2013-09-14 NOTE — Addendum Note (Signed)
Addended by: Willy Eddy on: 09/14/2013 02:19 PM   Modules accepted: Orders

## 2013-09-14 NOTE — Progress Notes (Signed)
Pre visit review using our clinic review tool, if applicable. No additional management support is needed unless otherwise documented below in the visit note. 

## 2013-09-14 NOTE — Patient Instructions (Signed)
The patient is instructed to continue all medications as prescribed. Schedule followup with check out clerk upon leaving the clinic  

## 2013-09-14 NOTE — Progress Notes (Signed)
Subjective:    Patient ID: Kelly Frazier, female    DOB: 02-28-1946, 67 y.o.   MRN: [id number]  HPI 67 year old female who presents for her welcome to Medicare examination.  She also is followed for a history of esophageal reflux and history of colon polyps up to date with colonoscopy a history of hypertension and hypothyroidism.  She drove wellness examination she needs monitoring for her stable long-term medical conditions including lipid management hypertension management and hypothyroid management at screening should include a lipid panel a liver panel for side effects of medication a basic metabolic panel to look at renal function and potassium levels and thyroid measurement.     Review of Systems  Constitutional: Negative for activity change, appetite change and fatigue.  HENT: Negative for congestion, ear pain, postnasal drip and sinus pressure.   Eyes: Negative for redness and visual disturbance.  Respiratory: Negative for cough, shortness of breath and wheezing.   Gastrointestinal: Negative for abdominal pain and abdominal distention.  Genitourinary: Negative for dysuria, frequency and menstrual problem.  Musculoskeletal: Negative for arthralgias, joint swelling, myalgias and neck pain.  Skin: Negative for rash and wound.  Neurological: Negative for dizziness, weakness and headaches.  Hematological: Negative for adenopathy. Does not bruise/bleed easily.  Psychiatric/Behavioral: Negative for sleep disturbance and decreased concentration.   Past Medical History  Diagnosis Date  . Headache(784.0)   . Thyroid disease   . Arthritis   . Hyperlipidemia   . Hypertension   . Hx of colonic polyps   . Diverticulosis     History   Social History  . Marital Status: Single    Spouse Name: N/A    Number of Children: N/A  . Years of Education: N/A   Occupational History  . Not on file.   Social History Main Topics  . Smoking status: Never Smoker   . Smokeless tobacco:  Not on file  . Alcohol Use: 1.2 oz/week    2 Glasses of wine per week  . Drug Use: Not on file  . Sexual Activity: Yes   Other Topics Concern  . Not on file   Social History Narrative  . No narrative on file    Past Surgical History  Procedure Laterality Date  . Colonoscopy  08/17/2006  . Bcca from eyelid    . Colonoscopy  08/17/06  . Axillary abcess irrigation and debridement      Family History  Problem Relation Age of Onset  . Cancer Mother     lung  . Liver disease Father   . Alcohol abuse Father     Allergies  Allergen Reactions  . Naprosyn [Naproxen] Swelling  . Terbinafine Hcl Swelling    Current Outpatient Prescriptions on File Prior to Visit  Medication Sig Dispense Refill  . Alum & Mag Hydroxide-Simeth (MAALOX PLUS PO) Take by mouth as needed.        Marland Kitchen atorvastatin (LIPITOR) 10 MG tablet Take 1 tablet (10 mg total) by mouth daily.  90 tablet  3  . bisoprolol-hydrochlorothiazide (ZIAC) 2.5-6.25 MG per tablet TAKE 1 TABLET BY MOUTH DAILY  90 tablet  2  . eletriptan (RELPAX) 40 MG tablet One tablet by mouth as needed for migraine headache.  If the headache improves and then returns, dose may be repeated after 2 hours have elapsed since first dose (do not exceed 80 mg per day). may repeat in 2 hours if necessary       . EPINEPHrine (EPIPEN JR) 0.15 MG/0.3ML injection Inject 0.15  mg into the muscle as needed.        Marland Kitchen ibuprofen (ADVIL,MOTRIN) 200 MG tablet Take 200 mg by mouth every 6 (six) hours as needed.        Marland Kitchen levonorgestrel-ethinyl estradiol (AVIANE) 0.1-20 MG-MCG tablet Take 1 tab daily- will take 4 active packs monthly--this is being changed due to your backorder of amethyst--this is to correct first sig-  4 Package  1  . levothyroxine (SYNTHROID, LEVOTHROID) 75 MCG tablet TAKE 1 TABLET BY MOUTH EVERY DAY  90 tablet  2  . loratadine (CLARITIN) 10 MG tablet Take 10 mg by mouth daily.        . multivitamin (THERAGRAN) per tablet Take 1 tablet by mouth daily.         . RABEprazole (ACIPHEX) 20 MG tablet Take 1 tablet (20 mg total) by mouth daily.  90 tablet  3   No current facility-administered medications on file prior to visit.    BP 130/80  Pulse 76  Temp(Src) 98.2 F (36.8 C)  Resp 16  Ht 5\' 4"  (1.626 m)  Wt 183 lb (83.008 kg)  BMI 31.40 kg/m2       Objective:   Physical Exam  Nursing note and vitals reviewed. Constitutional: She is oriented to person, place, and time. She appears well-developed and well-nourished. No distress.  HENT:  Head: Normocephalic and atraumatic.  Eyes: Conjunctivae and EOM are normal. Pupils are equal, round, and reactive to light.  Neck: Normal range of motion. Neck supple. No JVD present. No tracheal deviation present. No thyromegaly present.  Cardiovascular: Normal rate and regular rhythm.   Murmur heard. Pulmonary/Chest: Effort normal and breath sounds normal. She has no wheezes. She exhibits no tenderness.  Abdominal: Soft. Bowel sounds are normal.  Musculoskeletal: Normal range of motion. She exhibits no edema and no tenderness.  Lymphadenopathy:    She has no cervical adenopathy.  Neurological: She is alert and oriented to person, place, and time. She has normal reflexes. No cranial nerve deficit.  Skin: Skin is warm and dry. She is not diaphoretic.  Psychiatric: She has a normal mood and affect. Her behavior is normal.          Assessment & Plan:  Stable  Hypothyroidism Lipid management GERD Monitor labs Medicare wellness  Subjective:    Kelly Frazier is a 67 y.o. female who presents for a welcome to Medicare exam.   Cardiac risk factors: dyslipidemia, hypertension and sedentary lifestyle.  Activities of Daily Living  In your present state of health, do you have any difficulty performing the following activities?:  Preparing food and eating?: No Bathing yourself: No Getting dressed: No Using the toilet:No Moving around from place to place: No In the past year have you  fallen or had a near fall?:No  Current exercise habits: The patient does not participate in regular exercise at present.   Dietary issues discussed: weight gain   Depression Screen (Note: if answer to either of the following is "Yes", then a more complete depression screening is indicated)  Q1: Over the past two weeks, have you felt down, depressed or hopeless?no Q2: Over the past two weeks, have you felt little interest or pleasure in doing things? no   The following portions of the patient's history were reviewed and updated as appropriate: allergies, current medications, past family history, past medical history, past social history, past surgical history and problem list. Review of Systems Pertinent items are noted in HPI.    Objective:  Vision by Snellen chart: right eye:20/20, left eye:20/20 Blood pressure 130/80, pulse 76, temperature 98.2 F (36.8 C), resp. rate 16, height 5\' 4"  (1.626 m), weight 183 lb (83.008 kg). Body mass index is 31.4 kg/(m^2). BP 130/80  Pulse 76  Temp(Src) 98.2 F (36.8 C)  Resp 16  Ht 5\' 4"  (1.626 m)  Wt 183 lb (83.008 kg)  BMI 31.40 kg/m2  General Appearance:    Alert, cooperative, no distress, appears stated age  Head:    Normocephalic, without obvious abnormality, atraumatic  Eyes:    PERRL, conjunctiva/corneas clear, EOM's intact, fundi    benign, both eyes  Ears:    Normal TM's and external ear canals, both ears  Nose:   Nares normal, septum midline, mucosa normal, no drainage    or sinus tenderness  Throat:   Lips, mucosa, and tongue normal; teeth and gums normal  Neck:   Supple, symmetrical, trachea midline, no adenopathy;    thyroid:  no enlargement/tenderness/nodules; no carotid   bruit or JVD  Back:     Symmetric, no curvature, ROM normal, no CVA tenderness  Lungs:     Clear to auscultation bilaterally, respirations unlabored  Chest Wall:    No tenderness or deformity   Heart:    Regular rate and rhythm, S1 and S2 normal, no  murmur, rub   or gallop  Breast Exam:    No tenderness, masses, or nipple abnormality  Abdomen:     Soft, non-tender, bowel sounds active all four quadrants,    no masses, no organomegaly  Genitalia:    Normal female without lesion, discharge or tenderness  Rectal:    Normal tone, normal prostate, no masses or tenderness;   guaiac negative stool  Extremities:   Extremities normal, atraumatic, no cyanosis or edema  Pulses:   2+ and symmetric all extremities  Skin:   Skin color, texture, turgor normal, no rashes or lesions  Lymph nodes:   Cervical, supraclavicular, and axillary nodes normal  Neurologic:   CNII-XII intact, normal strength, sensation and reflexes    throughout      Assessment:      This is a routine physical examination for this healthy  Female. Reviewed all health maintenance protocols including mammography colonoscopy bone density and reviewed appropriate screening labs. Her immunization history was reviewed as well as her current medications and allergies refills of her chronic medications were given and the plan for yearly health maintenance was discussed all orders and referrals were made as appropriate.      Plan:     During the course of the visit the patient was educated and counseled about appropriate screening and preventive services including:   Pneumococcal vaccine   Patient Instructions (the written plan) was given to the patient.

## 2013-09-21 ENCOUNTER — Ambulatory Visit (HOSPITAL_BASED_OUTPATIENT_CLINIC_OR_DEPARTMENT_OTHER): Payer: Medicare Other

## 2013-10-30 ENCOUNTER — Other Ambulatory Visit: Payer: Self-pay | Admitting: Internal Medicine

## 2014-04-11 DIAGNOSIS — Z961 Presence of intraocular lens: Secondary | ICD-10-CM | POA: Diagnosis not present

## 2014-04-12 ENCOUNTER — Other Ambulatory Visit: Payer: Self-pay | Admitting: Internal Medicine

## 2014-06-28 ENCOUNTER — Ambulatory Visit (INDEPENDENT_AMBULATORY_CARE_PROVIDER_SITE_OTHER): Payer: Medicare Other

## 2014-06-28 DIAGNOSIS — Z23 Encounter for immunization: Secondary | ICD-10-CM

## 2014-07-19 ENCOUNTER — Ambulatory Visit: Payer: Medicare Other | Admitting: Family Medicine

## 2014-07-21 ENCOUNTER — Telehealth: Payer: Self-pay | Admitting: Family Medicine

## 2014-07-21 NOTE — Telephone Encounter (Signed)
Is this ok Dr. Hunter? 

## 2014-07-21 NOTE — Telephone Encounter (Signed)
Pt had appt on 07-19-14 that she cancelled due to cold. Pt would like to get in before 08/19/14. Can I create 30 min slot?

## 2014-07-22 NOTE — Telephone Encounter (Signed)
Is this ok Dr. Hunter? 

## 2014-07-24 NOTE — Telephone Encounter (Signed)
yes

## 2014-07-24 NOTE — Telephone Encounter (Signed)
That will be fine Kelly Frazier

## 2014-07-24 NOTE — Telephone Encounter (Signed)
Pt has been sch for 08-17-14

## 2014-08-17 ENCOUNTER — Encounter: Payer: Self-pay | Admitting: Family Medicine

## 2014-08-17 ENCOUNTER — Ambulatory Visit (INDEPENDENT_AMBULATORY_CARE_PROVIDER_SITE_OTHER): Payer: Medicare Other | Admitting: Family Medicine

## 2014-08-17 VITALS — BP 110/60 | HR 65 | Temp 98.0°F | Wt 185.0 lb

## 2014-08-17 DIAGNOSIS — I10 Essential (primary) hypertension: Secondary | ICD-10-CM | POA: Diagnosis not present

## 2014-08-17 DIAGNOSIS — E034 Atrophy of thyroid (acquired): Secondary | ICD-10-CM

## 2014-08-17 DIAGNOSIS — G43909 Migraine, unspecified, not intractable, without status migrainosus: Secondary | ICD-10-CM | POA: Insufficient documentation

## 2014-08-17 DIAGNOSIS — E785 Hyperlipidemia, unspecified: Secondary | ICD-10-CM | POA: Diagnosis not present

## 2014-08-17 DIAGNOSIS — G43109 Migraine with aura, not intractable, without status migrainosus: Secondary | ICD-10-CM

## 2014-08-17 DIAGNOSIS — E038 Other specified hypothyroidism: Secondary | ICD-10-CM | POA: Diagnosis not present

## 2014-08-17 LAB — LIPID PANEL
Cholesterol: 159 mg/dL (ref 0–200)
HDL: 30.1 mg/dL — ABNORMAL LOW (ref >39.00)
LDL Cholesterol: 110 mg/dL — ABNORMAL HIGH (ref 0–99)
NonHDL: 128.9
Total CHOL/HDL Ratio: 5
Triglycerides: 96 mg/dL (ref 0.0–149.0)
VLDL: 19.2 mg/dL (ref 0.0–40.0)

## 2014-08-17 LAB — CBC
HCT: 42.9 % (ref 36.0–46.0)
Hemoglobin: 14.5 g/dL (ref 12.0–15.0)
MCHC: 33.8 g/dL (ref 30.0–36.0)
MCV: 92.6 fl (ref 78.0–100.0)
Platelets: 295 10*3/uL (ref 150.0–400.0)
RBC: 4.64 Mil/uL (ref 3.87–5.11)
RDW: 14.4 % (ref 11.5–15.5)
WBC: 9.8 10*3/uL (ref 4.0–10.5)

## 2014-08-17 LAB — TSH: TSH: 2.84 u[IU]/mL (ref 0.35–4.50)

## 2014-08-17 LAB — COMPREHENSIVE METABOLIC PANEL
ALT: 50 U/L — ABNORMAL HIGH (ref 0–35)
AST: 39 U/L — ABNORMAL HIGH (ref 0–37)
Albumin: 3.7 g/dL (ref 3.5–5.2)
Alkaline Phosphatase: 45 U/L (ref 39–117)
BUN: 9 mg/dL (ref 6–23)
CO2: 23 mEq/L (ref 19–32)
Calcium: 8.4 mg/dL (ref 8.4–10.5)
Chloride: 105 mEq/L (ref 96–112)
Creatinine, Ser: 0.7 mg/dL (ref 0.4–1.2)
GFR: 87.32 mL/min (ref >60.00)
Glucose, Bld: 86 mg/dL (ref 70–99)
Potassium: 3.3 mEq/L — ABNORMAL LOW (ref 3.5–5.1)
Sodium: 139 mEq/L (ref 135–145)
Total Bilirubin: 0.9 mg/dL (ref 0.2–1.2)
Total Protein: 6.8 g/dL (ref 6.0–8.3)

## 2014-08-17 MED ORDER — ATORVASTATIN CALCIUM 10 MG PO TABS: 10.0000 mg | ORAL_TABLET | Freq: Every day | ORAL | Status: DC

## 2014-08-17 MED ORDER — RABEPRAZOLE SODIUM 20 MG PO TBEC: 20.0000 mg | DELAYED_RELEASE_TABLET | Freq: Every day | ORAL | Status: DC

## 2014-08-17 NOTE — Assessment & Plan Note (Signed)
Well-controlled. Continue levothyroxine 75 mcg

## 2014-08-17 NOTE — Assessment & Plan Note (Signed)
Mild poor control on labs today with LDL 110. Considered increasing atorvastatin to 20mg  from 10mg  but with slight bump in LFTs want patient to work on diet/exercise first (? Fatty liver). Continue atorvastatin 10mg .

## 2014-08-17 NOTE — Assessment & Plan Note (Signed)
Good control. Continue bisoprolol hydrochlorothiazide 2.5-6.25 mg

## 2014-08-17 NOTE — Patient Instructions (Signed)
Great to meet you!   Update labs today. I am suspicious we may need to bump up the cholesterol medicine but you can let me know your thoughts.

## 2014-08-17 NOTE — Assessment & Plan Note (Signed)
Good Control since she started birth control. Her plan is going to not cover her current birth control medication. We will follow-up once she has renewed plan and see what we can change her to. We discussed the increased risks on chronic hormone replacement.

## 2014-08-17 NOTE — Progress Notes (Signed)
Garret Reddish, MD Phone: [phone]  Subjective:  Patient presents today to establish care with me as their new primary care provider. Patient was formerly a patient of Dr. Arnoldo Morale. Chief complaint-noted.   Hypertension-good control  BP Readings from Last 3 Encounters:  08/17/14 110/60  09/14/13 130/80  10/18/12 130/86   Home BP monitoring-no Compliant with medications-yes without side effects ROS-Denies any CP, HA, SOB, blurry vision.   Hyperlipidemia-mild poor control previously  Lab Results  Component Value Date   LDLCALC 108* 09/14/2013   On statin: atorvastatin 10mg  Regular exercise: no Diet: some poor choices ROS- no chest pain or shortness of breath. No myalgias  Hypothyroidism-well controlled On thyroid medication-levothyroxine 58mcg.  ROS-No hair or nail changes. No heat/cold intolerance. No constipation or diarrhea.    Came back on Nov 1 from [place] and dealt with 2 weeks of fatigue, nasal congestion, sore throat,  coughing, variable slight elevated temperature without fever, felt very fatigued, body aches. Last week started to finally get out. Was exposed to sick contact in [place].   History of severe migraines since age 38 years old. Food was original trigger and then became hormonal.   Around 2002-2003, for several years in and out of work with migraines. Used BP meds to help and birth control pills. 1 migraine a year since starting birth control.    The following were reviewed and entered/updated in epic: Past Medical History  Diagnosis Date  . Headache(784.0)   . Thyroid disease   . Arthritis   . Hyperlipidemia   . Hypertension   . Hx of colonic polyps   . Diverticulosis    Patient Active Problem List   Diagnosis Date Noted  . Migraine 08/17/2014    Priority: Medium  . Hyperlipidemia 05/03/2007    Priority: Medium  . Essential hypertension 05/03/2007    Priority: Medium  . Hypothyroidism 04/12/2007    Priority: Medium  . Esophageal  reflux 09/03/2010    Priority: Low  . Angioedema 06/21/2010    Priority: Low  . History of colonic polyps 09/27/2007    Priority: Low  . Osteoarthritis 04/12/2007    Priority: Low   Past Surgical History  Procedure Laterality Date  . Bcca from eyelid      basal cell 1980s.   . Colonoscopy  08/17/06  . Axillary abcess irrigation and debridement      cyst    Family History  Problem Relation Age of Onset  . Cancer Mother     lung- nonsmoker. in her 63s, perhaps related to job  . Liver disease Father   . Alcohol abuse Father     Medications- reviewed and updated Current Outpatient Prescriptions  Medication Sig Dispense Refill  . atorvastatin (LIPITOR) 10 MG tablet Take 1 tablet (10 mg total) by mouth daily. 90 tablet 3  . bisoprolol-hydrochlorothiazide (ZIAC) 2.5-6.25 MG per tablet TAKE 1 TABLET BY MOUTH DAILY 90 tablet 1  . levonorgestrel-ethinyl estradiol (AVIANE,ALESSE,LESSINA) 0.1-20 MG-MCG tablet TAKE 1 TABLET BY MOUTH EVERY DAY CONTINUOUSLY 112 tablet 3  . levothyroxine (SYNTHROID, LEVOTHROID) 75 MCG tablet TAKE 1 TABLET BY MOUTH EVERY DAY 90 tablet 1  . RABEprazole (ACIPHEX) 20 MG tablet Take 1 tablet (20 mg total) by mouth daily. 90 tablet 3  . eletriptan (RELPAX) 40 MG tablet One tablet by mouth as needed for migraine headache.  If the headache improves and then returns, dose may be repeated after 2 hours have elapsed since first dose (do not exceed 80 mg per day). may repeat  in 2 hours if necessary     . EPINEPHrine (EPIPEN JR) 0.15 MG/0.3ML injection Inject 0.15 mg into the muscle as needed.      Marland Kitchen ibuprofen (ADVIL,MOTRIN) 200 MG tablet Take 200 mg by mouth every 6 (six) hours as needed.       No current facility-administered medications for this visit.    Allergies-reviewed and updated Allergies  Allergen Reactions  . Naprosyn [Naproxen] Swelling  . Terbinafine Hcl Swelling    History   Social History  . Marital Status: Single    Spouse Name: N/A    Number  of Children: N/A  . Years of Education: N/A   Social History Main Topics  . Smoking status: Never Smoker   . Smokeless tobacco: None  . Alcohol Use: 1.2 oz/week    2 Glasses of wine per week  . Drug Use: None  . Sexual Activity: Yes   Other Topics Concern  . None   Social History Narrative   Divorced in 20s. 1 son. 2 twin grandkids age 75 in 28- in [place].       Retired from [employer].     ROS--See HPI   Objective: BP 110/60 mmHg  Pulse 65  Temp(Src) 98 F (36.7 C)  Wt 185 lb (83.915 kg)  SpO2 93% Gen: NAD, resting comfortably HEENT: Mucous membranes are moist. Oropharynx normal. Good dentition Neck: no thyromegaly CV: RRR no murmurs rubs or gallops Lungs: CTAB no crackles, wheeze, rhonchi Abdomen: soft/nontender/nondistended/normal bowel sounds.  Ext: no edema Skin: warm, dry, no rash  Assessment/Plan:  Essential hypertension Good control. Continue bisoprolol hydrochlorothiazide 2.5-6.25 mg  Hyperlipidemia Mild poor control on labs today with LDL 110. Considered increasing atorvastatin to 20mg  from 10mg  but with slight bump in LFTs want patient to work on diet/exercise first (? Fatty liver). Continue atorvastatin 10mg .   Hypothyroidism Well-controlled. Continue levothyroxine 75 mcg  Migraine Good Control since she started birth control. Her plan is going to not cover her current birth control medication. We will follow-up once she has renewed plan and see what we can change her to. We discussed the increased risks on chronic hormone replacement.   Fasting Orders Placed This Encounter  Procedures  . CBC    [place]  . Comprehensive metabolic panel    [place]    Order Specific Question:  Has the patient fasted?    Answer:  No  . Lipid panel    [place]    Order Specific Question:  Has the patient fasted?    Answer:  No  . TSH    [place]   Meds ordered this encounter  Medications  . DISCONTD: atorvastatin (LIPITOR) 10 MG tablet    Sig: Take 1 tablet (10  mg total) by mouth daily.    Dispense:  30 tablet    Refill:  0  . atorvastatin (LIPITOR) 10 MG tablet    Sig: Take 1 tablet (10 mg total) by mouth daily.    Dispense:  90 tablet    Refill:  3  . DISCONTD: RABEprazole (ACIPHEX) 20 MG tablet    Sig: Take 1 tablet (20 mg total) by mouth daily.    Dispense:  30 tablet    Refill:  0  . RABEprazole (ACIPHEX) 20 MG tablet    Sig: Take 1 tablet (20 mg total) by mouth daily.    Dispense:  90 tablet    Refill:  3

## 2014-10-19 ENCOUNTER — Ambulatory Visit (INDEPENDENT_AMBULATORY_CARE_PROVIDER_SITE_OTHER): Payer: Medicare Other | Admitting: Family Medicine

## 2014-10-19 ENCOUNTER — Telehealth: Payer: Self-pay | Admitting: Family Medicine

## 2014-10-19 ENCOUNTER — Encounter: Payer: Self-pay | Admitting: Family Medicine

## 2014-10-19 VITALS — BP 100/60 | Temp 98.5°F | Wt 175.0 lb

## 2014-10-19 DIAGNOSIS — R6889 Other general symptoms and signs: Secondary | ICD-10-CM

## 2014-10-19 DIAGNOSIS — J111 Influenza due to unidentified influenza virus with other respiratory manifestations: Secondary | ICD-10-CM | POA: Diagnosis not present

## 2014-10-19 LAB — POCT INFLUENZA A/B
Influenza A, POC: POSITIVE
Influenza B, POC: POSITIVE

## 2014-10-19 MED ORDER — OSELTAMIVIR PHOSPHATE 75 MG PO CAPS: 75.0000 mg | ORAL_CAPSULE | Freq: Two times a day (BID) | ORAL | Status: DC

## 2014-10-19 NOTE — Progress Notes (Signed)
  Garret Reddish, MD Phone: [phone]  Subjective:   Kelly Frazier is a 69 y.o. year old very pleasant female patient who presents with the following:  Influenza Tuesday evening at 9pm started with chills and fever, achiness, fatigue, lack of appetite, running nose, sneezing, coughing. Subjective fevers at home. No treatments tried yet. Only thing that helps is rest. Got flu shot in august. Nonproductive cough.   ROS- no shortness of breath. No chest pain.   Past Medical History- Patient Active Problem List   Diagnosis Date Noted  . Migraine 08/17/2014    Priority: Medium  . Hyperlipidemia 05/03/2007    Priority: Medium  . Essential hypertension 05/03/2007    Priority: Medium  . Hypothyroidism 04/12/2007    Priority: Medium  . Esophageal reflux 09/03/2010    Priority: Low  . Angioedema 06/21/2010    Priority: Low  . History of colonic polyps 09/27/2007    Priority: Low  . Osteoarthritis 04/12/2007    Priority: Low   Medications- reviewed and updated Current Outpatient Prescriptions  Medication Sig Dispense Refill  . atorvastatin (LIPITOR) 10 MG tablet Take 1 tablet (10 mg total) by mouth daily. 90 tablet 3  . bisoprolol-hydrochlorothiazide (ZIAC) 2.5-6.25 MG per tablet TAKE 1 TABLET BY MOUTH DAILY 90 tablet 1  . eletriptan (RELPAX) 40 MG tablet One tablet by mouth as needed for migraine headache.  If the headache improves and then returns, dose may be repeated after 2 hours have elapsed since first dose (do not exceed 80 mg per day). may repeat in 2 hours if necessary     . ibuprofen (ADVIL,MOTRIN) 200 MG tablet Take 200 mg by mouth every 6 (six) hours as needed.      Marland Kitchen levonorgestrel-ethinyl estradiol (AVIANE,ALESSE,LESSINA) 0.1-20 MG-MCG tablet TAKE 1 TABLET BY MOUTH EVERY DAY CONTINUOUSLY 112 tablet 3  . levothyroxine (SYNTHROID, LEVOTHROID) 75 MCG tablet TAKE 1 TABLET BY MOUTH EVERY DAY 90 tablet 1  . RABEprazole (ACIPHEX) 20 MG tablet Take 1 tablet (20 mg total) by  mouth daily. 90 tablet 3  . EPINEPHrine (EPIPEN JR) 0.15 MG/0.3ML injection Inject 0.15 mg into the muscle as needed.       Objective: BP 100/60 mmHg  Temp(Src) 98.5 F (36.9 C)  Wt 175 lb (79.379 kg)  Pulse ox 98%, 70 HR Gen: NAD, very fatigued appearing Erythematous nares with drainage noted, oropharynx largely normal CV: RRR no murmurs rubs or gallops Lungs: CTAB no crackles, wheeze, rhonchi.  Abdomen: soft/nontender/nondistended/normal bowel sounds.  Skin: warm, dry, no rash Neuro: grossly normal, moves all extremities   Results for orders placed or performed in visit on 10/19/14 (from the past 24 hour(s))  POC Influenza A/B     Status: None   Collection Time: 10/19/14  2:12 PM  Result Value Ref Range   Influenza A, POC Positive    Influenza B, POC Positive    Assessment/Plan:  Influenza Positive flu. 5 days of tamiflu given within 48 hours of symptoms. Advised of strict reasons for return. Likely milder given received flu shot. Advised of handwashing,wearing mask need.   Meds ordered this encounter  Medications  . oseltamivir (TAMIFLU) 75 MG capsule    Sig: Take 1 capsule (75 mg total) by mouth 2 (two) times daily.    Dispense:  10 capsule    Refill:  0

## 2014-10-19 NOTE — Telephone Encounter (Signed)
appt scheduled

## 2014-10-19 NOTE — Telephone Encounter (Signed)
Pt will need to be seen

## 2014-10-19 NOTE — Telephone Encounter (Signed)
Pt states she has the flu.  Her grandchildren of 67 yrs old had it and she caught from them.  Fever,chills, body aches, no appetite, a little sore throat. Would like to know if you would send tamiflu to [facility] [address] [place]  [address], [place], [place] [address]  [phone]

## 2014-10-19 NOTE — Patient Instructions (Signed)

## 2014-11-24 ENCOUNTER — Encounter: Payer: Self-pay | Admitting: Family Medicine

## 2014-11-24 ENCOUNTER — Other Ambulatory Visit: Payer: Self-pay | Admitting: *Deleted

## 2014-11-24 ENCOUNTER — Ambulatory Visit (INDEPENDENT_AMBULATORY_CARE_PROVIDER_SITE_OTHER): Payer: Medicare Other | Admitting: Family Medicine

## 2014-11-24 VITALS — BP 142/72 | Temp 97.4°F | Wt 172.0 lb

## 2014-11-24 DIAGNOSIS — Z23 Encounter for immunization: Secondary | ICD-10-CM | POA: Diagnosis not present

## 2014-11-24 DIAGNOSIS — R519 Headache, unspecified: Secondary | ICD-10-CM

## 2014-11-24 DIAGNOSIS — M25532 Pain in left wrist: Secondary | ICD-10-CM

## 2014-11-24 DIAGNOSIS — R51 Headache: Secondary | ICD-10-CM

## 2014-11-24 MED ORDER — LEVOTHYROXINE SODIUM 75 MCG PO TABS: 75.0000 ug | ORAL_TABLET | Freq: Every day | ORAL | Status: DC

## 2014-11-24 MED ORDER — LEVONORGESTREL-ETHINYL ESTRAD 90-20 MCG PO TABS: 1.0000 | ORAL_TABLET | Freq: Every day | ORAL | Status: DC

## 2014-11-24 MED ORDER — BISOPROLOL-HYDROCHLOROTHIAZIDE 2.5-6.25 MG PO TABS: 1.0000 | ORAL_TABLET | Freq: Every day | ORAL | Status: DC
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
## 2014-11-29 ENCOUNTER — Encounter: Payer: Self-pay | Admitting: Family Medicine

## 2014-11-29 ENCOUNTER — Ambulatory Visit (INDEPENDENT_AMBULATORY_CARE_PROVIDER_SITE_OTHER): Payer: Medicare Other | Admitting: Family Medicine

## 2014-11-29 VITALS — BP 146/80 | Temp 98.1°F | Wt 181.0 lb

## 2014-11-29 DIAGNOSIS — I10 Essential (primary) hypertension: Secondary | ICD-10-CM | POA: Diagnosis not present

## 2014-11-29 DIAGNOSIS — R51 Headache: Secondary | ICD-10-CM | POA: Diagnosis not present

## 2014-11-29 DIAGNOSIS — R519 Headache, unspecified: Secondary | ICD-10-CM

## 2014-11-29 MED ORDER — RABEPRAZOLE SODIUM 20 MG PO TBEC: 20.0000 mg | DELAYED_RELEASE_TABLET | Freq: Every day | ORAL | Status: DC
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
## 2014-11-29 NOTE — Patient Instructions (Signed)
Thanks for coming in  You do not have the hematoma that I was worried about  You may have a nasal fracture but I do not think if we found it at this point it would change management. We opted to hold off and if you have persistent pain at 4 weeks we will either plan on facial CT or send you to ENT for further evaluation. Please give me an update at 4 weeks if this pain persists and certainly sooner if you notice a red bulge in your nose or have worsening nasal pain or headaches. Keep icing for a few more days.

## 2014-11-29 NOTE — Assessment & Plan Note (Signed)
Very mild poor control on scheduled nsaids for facial pain and also with some mild pain still. We opted to continue to follow as a month prior excellent control.

## 2014-12-05 ENCOUNTER — Ambulatory Visit (INDEPENDENT_AMBULATORY_CARE_PROVIDER_SITE_OTHER): Payer: Medicare Other | Admitting: Family Medicine

## 2014-12-05 ENCOUNTER — Encounter: Payer: Self-pay | Admitting: Family Medicine

## 2014-12-05 VITALS — BP 122/80 | HR 61 | Temp 98.4°F | Wt 174.0 lb

## 2014-12-05 DIAGNOSIS — F0781 Postconcussional syndrome: Secondary | ICD-10-CM

## 2014-12-05 DIAGNOSIS — R413 Other amnesia: Secondary | ICD-10-CM | POA: Diagnosis not present

## 2014-12-05 MED ORDER — RABEPRAZOLE SODIUM 20 MG PO TBEC: 20.0000 mg | DELAYED_RELEASE_TABLET | Freq: Every day | ORAL | Status: DC

## 2014-12-05 MED ORDER — LEVONORGESTREL-ETHINYL ESTRAD 90-20 MCG PO TABS: 1.0000 | ORAL_TABLET | Freq: Every day | ORAL | Status: DC
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
## 2014-12-06 ENCOUNTER — Ambulatory Visit (INDEPENDENT_AMBULATORY_CARE_PROVIDER_SITE_OTHER)
Admission: RE | Admit: 2014-12-06 | Discharge: 2014-12-06 | Disposition: A | Discharge status: Home / Self Care | Payer: Medicare Other | Source: Ambulatory Visit | Attending: Family Medicine | Admitting: Family Medicine

## 2014-12-06 DIAGNOSIS — R413 Other amnesia: Secondary | ICD-10-CM | POA: Diagnosis not present

## 2014-12-06 DIAGNOSIS — S0990XA Unspecified injury of head, initial encounter: Secondary | ICD-10-CM | POA: Diagnosis not present

## 2014-12-06 DIAGNOSIS — R42 Dizziness and giddiness: Secondary | ICD-10-CM | POA: Diagnosis not present

## 2014-12-06 DIAGNOSIS — R51 Headache: Secondary | ICD-10-CM | POA: Diagnosis not present

## 2014-12-27 ENCOUNTER — Encounter: Payer: Self-pay | Admitting: Family Medicine

## 2014-12-27 ENCOUNTER — Ambulatory Visit (INDEPENDENT_AMBULATORY_CARE_PROVIDER_SITE_OTHER): Payer: Medicare Other | Admitting: Family Medicine

## 2014-12-27 VITALS — BP 118/70 | HR 84 | Temp 97.0°F | Wt 179.0 lb

## 2014-12-27 DIAGNOSIS — R519 Headache, unspecified: Secondary | ICD-10-CM

## 2014-12-27 DIAGNOSIS — F0781 Postconcussional syndrome: Secondary | ICD-10-CM | POA: Diagnosis not present

## 2014-12-27 DIAGNOSIS — R413 Other amnesia: Secondary | ICD-10-CM | POA: Diagnosis not present

## 2014-12-27 DIAGNOSIS — R51 Headache: Secondary | ICD-10-CM

## 2014-12-27 MED ORDER — OMEPRAZOLE 20 MG PO CPDR: 20.0000 mg | DELAYED_RELEASE_CAPSULE | Freq: Every day | ORAL | Status: DC

## 2014-12-27 NOTE — Patient Instructions (Signed)
Neuro exam still normal. Headache and forgetfulness improving. I think this would improve more rapidly if you were really able to rest. Let's check in 6 months from now or sooner if you were to have worsening.   Health Maintenance Due  Topic Date Due  . MAMMOGRAM - please call and schedule 09/14/2014

## 2014-12-27 NOTE — Progress Notes (Signed)
Garret Reddish, MD Phone: [phone]  Subjective:   Kelly Frazier is a 69 y.o. year old very pleasant female patient who presents with the following:  Post concussive syndrome with headaches and intermittent memory loss -1-4 hours. May skip 2-3 days or have 2-3 in a day. Advil resolves if takes or resolves with time otherwise. Dull ache behind nose radiating to left eye  About 50% better with memory issues, not as frequent and not as long lived.   She never rested as instructed but does try to take it easier on some days.   ROS-no blurry vision, no worsening headaches, no extremity weakness, no chest pain or palpitations. No longer feeling off balance intermittently.   Past Medical History- Patient Active Problem List   Diagnosis Date Noted  . Migraine 08/17/2014    Priority: Medium  . Hyperlipidemia 05/03/2007    Priority: Medium  . Essential hypertension 05/03/2007    Priority: Medium  . Hypothyroidism 04/12/2007    Priority: Medium  . Esophageal reflux 09/03/2010    Priority: Low  . Angioedema 06/21/2010    Priority: Low  . History of colonic polyps 09/27/2007    Priority: Low  . Osteoarthritis 04/12/2007    Priority: Low   Medications- reviewed and updated Current Outpatient Prescriptions  Medication Sig Dispense Refill  . atorvastatin (LIPITOR) 10 MG tablet Take 1 tablet (10 mg total) by mouth daily. 90 tablet 3  . bisoprolol-hydrochlorothiazide (ZIAC) 2.5-6.25 MG per tablet Take 1 tablet by mouth daily. 90 tablet 3  . eletriptan (RELPAX) 40 MG tablet One tablet by mouth as needed for migraine headache.  If the headache improves and then returns, dose may be repeated after 2 hours have elapsed since first dose (do not exceed 80 mg per day). may repeat in 2 hours if necessary     . ibuprofen (ADVIL,MOTRIN) 200 MG tablet Take 200 mg by mouth every 6 (six) hours as needed.      Marland Kitchen levonorgestrel-ethinyl estradiol (AMETHYST) 90-20 MCG tablet Take 1 tablet by mouth  daily. Only take active pills 120 tablet 3  . levothyroxine (SYNTHROID, LEVOTHROID) 75 MCG tablet Take 1 tablet (75 mcg total) by mouth daily. 90 tablet 3  . EPINEPHrine (EPIPEN JR) 0.15 MG/0.3ML injection Inject 0.15 mg into the muscle as needed.      . RABEprazole (ACIPHEX) 20 MG tablet Take 1 tablet (20 mg total) by mouth daily. (Patient not taking: Reported on 12/27/2014) 90 tablet 3   Objective: BP 118/70 mmHg  Pulse 84  Temp(Src) 97 F (36.1 C)  Wt 179 lb (81.194 kg) Gen: NAD, resting comfortably ENT:  Areas of prior erythema now covered with makeup. Very minimal pain with palpation of nasal bridge.  CV: RRR no murmurs rubs or gallops Lungs: CTAB no crackles, wheeze, rhonchi  Neuro: CN II-XII intact, sensation and reflexes normal throughout, 5/5 muscle strength in bilateral upper and lower extremities. Normal finger to nose. Normal rapid alternating movements. Normal romberg. No pronator drift.  Did not complete Standard assessment of concussion. Consider if symptoms persist at follow up.   Assessment/Plan:  Post concussive syndrome with headaches and intermittent memory loss At least 50% improvement and still with neuro normal exam. Discussed symptoms would likely resolve quicker if she would moderate her activities and give her brain rest but she does not plan to do this. Discussed likely longer resolution as a result. We will follow up in 6 months unless worsening symptoms. Consider repeat standard assessment of concussion and if  not improved or worsened, refer to neuro at that time.   Return precautions advised. Needs weight loss, regular exercise, healthy eating-may need to wait until symptoms improve as above to start the exercise though.  Current PPI not covered Meds ordered this encounter  Medications  . Local rx: omeprazole (PRILOSEC) 20 MG capsule    Sig: Take 1 capsule (20 mg total) by mouth daily.    Dispense:  30 capsule    Refill:  0  . Mail orderomeprazole  (PRILOSEC) 20 MG capsule    Sig: Take 1 capsule (20 mg total) by mouth daily.    Dispense:  90 capsule    Refill:  3

## 2015-02-02 ENCOUNTER — Telehealth: Payer: Self-pay | Admitting: Family Medicine

## 2015-02-02 MED ORDER — LEVONORGESTREL-ETHINYL ESTRAD 90-20 MCG PO TABS: 1.0000 | ORAL_TABLET | Freq: Every day | ORAL | Status: DC

## 2015-02-02 NOTE — Telephone Encounter (Signed)
Pt request refill of the following: levonorgestrel-ethinyl estradiol (AMETHYST) 90-20 MCG tablet   Phamacy: [facility] [place] [facility] [employer]

## 2015-02-02 NOTE — Telephone Encounter (Signed)
Medication refilled

## 2015-06-28 ENCOUNTER — Ambulatory Visit (INDEPENDENT_AMBULATORY_CARE_PROVIDER_SITE_OTHER): Payer: Medicare Other | Admitting: Family Medicine

## 2015-06-28 ENCOUNTER — Encounter: Payer: Self-pay | Admitting: Family Medicine

## 2015-06-28 VITALS — BP 130/84 | HR 68 | Temp 97.9°F | Wt 191.0 lb

## 2015-06-28 DIAGNOSIS — E038 Other specified hypothyroidism: Secondary | ICD-10-CM

## 2015-06-28 DIAGNOSIS — I10 Essential (primary) hypertension: Secondary | ICD-10-CM | POA: Diagnosis not present

## 2015-06-28 DIAGNOSIS — E785 Hyperlipidemia, unspecified: Secondary | ICD-10-CM

## 2015-06-28 DIAGNOSIS — Z23 Encounter for immunization: Secondary | ICD-10-CM | POA: Diagnosis not present

## 2015-06-28 DIAGNOSIS — F0781 Postconcussional syndrome: Secondary | ICD-10-CM | POA: Diagnosis not present

## 2015-06-28 DIAGNOSIS — E034 Atrophy of thyroid (acquired): Secondary | ICD-10-CM

## 2015-06-28 LAB — COMPREHENSIVE METABOLIC PANEL
ALT: 27 U/L (ref 0–35)
AST: 23 U/L (ref 0–37)
Albumin: 3.7 g/dL (ref 3.5–5.2)
Alkaline Phosphatase: 51 U/L (ref 39–117)
BUN: 8 mg/dL (ref 6–23)
CO2: 24 mEq/L (ref 19–32)
Calcium: 8.6 mg/dL (ref 8.4–10.5)
Chloride: 108 mEq/L (ref 96–112)
Creatinine, Ser: 0.68 mg/dL (ref 0.40–1.20)
GFR: 91.54 mL/min (ref >60.00)
Glucose, Bld: 106 mg/dL — ABNORMAL HIGH (ref 70–99)
Potassium: 3.9 mEq/L (ref 3.5–5.1)
Sodium: 142 mEq/L (ref 135–145)
Total Bilirubin: 0.4 mg/dL (ref 0.2–1.2)
Total Protein: 6.3 g/dL (ref 6.0–8.3)

## 2015-06-28 LAB — TSH: TSH: 3.46 u[IU]/mL (ref 0.35–4.50)

## 2015-06-28 LAB — LDL CHOLESTEROL, DIRECT: Direct LDL: 97 mg/dL
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
## 2015-06-28 NOTE — Assessment & Plan Note (Signed)
S: controlled on Bisoprolol-hctz 2.5-5.25.  BP Readings from Last 3 Encounters:  06/28/15 130/84  12/27/14 118/70  12/05/14 122/80  A/P: continue current rx

## 2015-06-28 NOTE — Assessment & Plan Note (Signed)
S: mild poor control previously. Only taking 1/2 of 10mg  atorvastatin Lab Results  Component Value Date   CHOL 159 08/17/2014   HDL 30.10* 08/17/2014   LDLCALC 110* 08/17/2014   LDLDIRECT 128.0 07/31/2010   TRIG 96.0 08/17/2014   CHOLHDL 5 08/17/2014  A/P: Mild LFT elevations- could be fatty liver related- encouraged weight loss. Check LFTs- if going up may need to hold statin at least short term as work up LFTS if >3x ULN

## 2015-06-28 NOTE — Assessment & Plan Note (Signed)
S: controlled on levothyroxine 59mcg Lab Results  Component Value Date   TSH 2.84 08/17/2014  A/P: check TSH today

## 2015-06-28 NOTE — Progress Notes (Signed)
Garret Reddish, MD  Subjective:  CHE BELOW is a 69 y.o. year old very pleasant female patient who presents for/with See problem oriented charting ROS- no chest pain, shortness of breath. Does have intermittent headaches- mild. No unintentional weight loss. No hot or cold intolerance  Past Medical History-  Patient Active Problem List   Diagnosis Date Noted  . Migraine 08/17/2014    Priority: Medium  . Hyperlipidemia 05/03/2007    Priority: Medium  . Essential hypertension 05/03/2007    Priority: Medium  . Hypothyroidism 04/12/2007    Priority: Medium  . Esophageal reflux 09/03/2010    Priority: Low  . Angioedema 06/21/2010    Priority: Low  . History of colonic polyps 09/27/2007    Priority: Low  . Osteoarthritis 04/12/2007    Priority: Low    Medications- reviewed and updated Current Outpatient Prescriptions  Medication Sig Dispense Refill  . atorvastatin (LIPITOR) 10 MG tablet Take 1 tablet (10 mg total) by mouth daily. 90 tablet 3  . bisoprolol-hydrochlorothiazide (ZIAC) 2.5-6.25 MG per tablet Take 1 tablet by mouth daily. 90 tablet 3  . eletriptan (RELPAX) 40 MG tablet One tablet by mouth as needed for migraine headache.  If the headache improves and then returns, dose may be repeated after 2 hours have elapsed since first dose (do not exceed 80 mg per day). may repeat in 2 hours if necessary     . levonorgestrel-ethinyl estradiol (AMETHYST) 90-20 MCG tablet Take 1 tablet by mouth daily. Only take active pills 120 tablet 3  . levothyroxine (SYNTHROID, LEVOTHROID) 75 MCG tablet Take 1 tablet (75 mcg total) by mouth daily. 90 tablet 3  . omeprazole (PRILOSEC) 20 MG capsule Take 1 capsule (20 mg total) by mouth daily. 90 capsule 3  . EPINEPHrine (EPIPEN JR) 0.15 MG/0.3ML injection Inject 0.15 mg into the muscle as needed.      Marland Kitchen ibuprofen (ADVIL,MOTRIN) 200 MG tablet Take 200 mg by mouth every 6 (six) hours as needed.       No current facility-administered medications  for this visit.    Objective: BP 130/84 mmHg  Pulse 68  Temp(Src) 97.9 F (36.6 C)  Wt 191 lb (86.637 kg) Gen: NAD, resting comfortably No thyromegaly CV: RRR no murmurs rubs or gallops Lungs: CTAB no crackles, wheeze, rhonchi Abdomen: soft/nontender/nondistended/normal bowel sounds. No rebound or guarding. obese Ext: no edema Skin: warm, dry Neuro: grossly normal, moves all extremities   Assessment/Plan:  Hypothyroidism S: controlled on levothyroxine 86mcg Lab Results  Component Value Date   TSH 2.84 08/17/2014  A/P: check TSH today   Hyperlipidemia S: mild poor control previously. Only taking 1/2 of 10mg  atorvastatin Lab Results  Component Value Date   CHOL 159 08/17/2014   HDL 30.10* 08/17/2014   LDLCALC 110* 08/17/2014   LDLDIRECT 128.0 07/31/2010   TRIG 96.0 08/17/2014   CHOLHDL 5 08/17/2014  A/P: Mild LFT elevations- could be fatty liver related- encouraged weight loss. Check LFTs- if going up may need to hold statin at least short term as work up LFTS if >3x ULN   Essential hypertension S: controlled on Bisoprolol-hctz 2.5-5.25.  BP Readings from Last 3 Encounters:  06/28/15 130/84  12/27/14 118/70  12/05/14 122/80  A/P: continue current rx  Post concussive syndrome from 6 months ago- S:Still some agitation, memory better, balance better. 75% better from 50% last visit. No falls or injury A/P: continue to monitor, could be PTSD element, normal neuro exam today. Consider neuro if does not continue to  improve  Return precautions advised.   Orders Placed This Encounter  Procedures  . Comprehensive metabolic panel    [place]  . LDL cholesterol, direct    [place]  . TSH       Health Maintenance Due  Topic Date Due  . Hepatitis C Screening - declines, readdress 1 year 01/04/1946  . MAMMOGRAM - advised 09/14/2014  . INFLUENZA VACCINE - today given 04/16/2015   Bevelyn Ngo to chart flu shot Immunization History  Administered Date(s) Administered   . Influenza Split 06/23/2011  . Influenza Whole 08/31/2007, 06/27/2008, 06/21/2009, 06/21/2010, 07/31/2010  . Influenza,inj,Quad PF,36+ Mos 06/14/2013, 06/28/2014  . Pneumococcal Polysaccharide-23 09/14/2013  . Td 09/15/2005, 11/24/2014  . Zoster 12/31/2007

## 2015-08-29 ENCOUNTER — Other Ambulatory Visit: Payer: Self-pay | Admitting: Family Medicine

## 2016-01-09 ENCOUNTER — Other Ambulatory Visit: Payer: Self-pay | Admitting: Family Medicine

## 2016-02-20 ENCOUNTER — Other Ambulatory Visit: Payer: Self-pay | Admitting: Family Medicine

## 2016-02-20 DIAGNOSIS — Z1231 Encounter for screening mammogram for malignant neoplasm of breast: Secondary | ICD-10-CM

## 2016-02-21 ENCOUNTER — Other Ambulatory Visit: Payer: Self-pay | Admitting: Family Medicine

## 2016-02-21 ENCOUNTER — Encounter: Payer: Self-pay | Admitting: Family Medicine

## 2016-02-21 ENCOUNTER — Ambulatory Visit (INDEPENDENT_AMBULATORY_CARE_PROVIDER_SITE_OTHER): Payer: Medicare Other | Admitting: Family Medicine

## 2016-02-21 ENCOUNTER — Ambulatory Visit (HOSPITAL_BASED_OUTPATIENT_CLINIC_OR_DEPARTMENT_OTHER)
Admission: RE | Admit: 2016-02-21 | Discharge: 2016-02-21 | Disposition: A | Discharge status: Home / Self Care | Payer: Medicare Other | Source: Ambulatory Visit | Attending: Family Medicine | Admitting: Family Medicine

## 2016-02-21 VITALS — BP 132/82 | HR 68 | Temp 97.9°F | Ht 64.0 in | Wt 191.0 lb

## 2016-02-21 DIAGNOSIS — Z7289 Other problems related to lifestyle: Secondary | ICD-10-CM | POA: Diagnosis not present

## 2016-02-21 DIAGNOSIS — Z23 Encounter for immunization: Secondary | ICD-10-CM | POA: Diagnosis not present

## 2016-02-21 DIAGNOSIS — R32 Unspecified urinary incontinence: Secondary | ICD-10-CM

## 2016-02-21 DIAGNOSIS — R739 Hyperglycemia, unspecified: Secondary | ICD-10-CM | POA: Diagnosis not present

## 2016-02-21 DIAGNOSIS — Z1231 Encounter for screening mammogram for malignant neoplasm of breast: Secondary | ICD-10-CM | POA: Insufficient documentation

## 2016-02-21 DIAGNOSIS — R0602 Shortness of breath: Secondary | ICD-10-CM

## 2016-02-21 DIAGNOSIS — R5383 Other fatigue: Secondary | ICD-10-CM

## 2016-02-21 DIAGNOSIS — F0781 Postconcussional syndrome: Secondary | ICD-10-CM

## 2016-02-21 LAB — COMPREHENSIVE METABOLIC PANEL
ALT: 30 U/L (ref 0–35)
AST: 25 U/L (ref 0–37)
Albumin: 4.2 g/dL (ref 3.5–5.2)
Alkaline Phosphatase: 50 U/L (ref 39–117)
BUN: 15 mg/dL (ref 6–23)
CO2: 25 mEq/L (ref 19–32)
Calcium: 9 mg/dL (ref 8.4–10.5)
Chloride: 104 mEq/L (ref 96–112)
Creatinine, Ser: 0.76 mg/dL (ref 0.40–1.20)
GFR: 80.36 mL/min (ref >60.00)
Glucose, Bld: 89 mg/dL (ref 70–99)
Potassium: 3.9 mEq/L (ref 3.5–5.1)
Sodium: 138 mEq/L (ref 135–145)
Total Bilirubin: 0.5 mg/dL (ref 0.2–1.2)
Total Protein: 7.2 g/dL (ref 6.0–8.3)

## 2016-02-21 LAB — POC URINALSYSI DIPSTICK (AUTOMATED)
Bilirubin, UA: NEGATIVE
Blood, UA: NEGATIVE
Glucose, UA: NEGATIVE
Ketones, UA: NEGATIVE
Nitrite, UA: NEGATIVE
Protein, UA: NEGATIVE
Spec Grav, UA: 1.015
Urobilinogen, UA: 0.2
pH, UA: 5.5

## 2016-02-21 LAB — CBC WITH DIFFERENTIAL/PLATELET
Basophils Absolute: 0.1 10*3/uL (ref 0.0–0.1)
Basophils Relative: 0.5 % (ref 0.0–3.0)
Eosinophils Absolute: 0.2 10*3/uL (ref 0.0–0.7)
Eosinophils Relative: 1.6 % (ref 0.0–5.0)
HCT: 44.5 % (ref 36.0–46.0)
Hemoglobin: 15.1 g/dL — ABNORMAL HIGH (ref 12.0–15.0)
Lymphocytes Relative: 21.9 % (ref 12.0–46.0)
Lymphs Abs: 2.5 10*3/uL (ref 0.7–4.0)
MCHC: 33.9 g/dL (ref 30.0–36.0)
MCV: 91.7 fl (ref 78.0–100.0)
Monocytes Absolute: 0.5 10*3/uL (ref 0.1–1.0)
Monocytes Relative: 4.7 % (ref 3.0–12.0)
Neutro Abs: 8.1 10*3/uL — ABNORMAL HIGH (ref 1.4–7.7)
Neutrophils Relative %: 71.3 % (ref 43.0–77.0)
Platelets: 311 10*3/uL (ref 150.0–400.0)
RBC: 4.86 Mil/uL (ref 3.87–5.11)
RDW: 14.3 % (ref 11.5–15.5)
WBC: 11.3 10*3/uL — ABNORMAL HIGH (ref 4.0–10.5)

## 2016-02-21 LAB — HEMOGLOBIN A1C: Hgb A1c MFr Bld: 5.7 % (ref 4.6–6.5)

## 2016-02-21 LAB — TSH: TSH: 2.5 u[IU]/mL (ref 0.35–4.50)

## 2016-02-21 NOTE — Progress Notes (Signed)
Pre visit review using our clinic review tool, if applicable. No additional management support is needed unless otherwise documented below in the visit note. 
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
## 2016-02-21 NOTE — Patient Instructions (Addendum)
EKG looks great  advised yout to return to see her opthalmologist   Labs before you leave  We will call you within a week about your referral to echocardiogram. If you do not hear within 2 weeks, give [place] a call.   Go get x-ray at [place] [facility]  Bring stool cards back to [place]. Pick up kit at lab.   Orders Placed This Encounter  Procedures  . Urine culture    [facility]  . DG Chest 2 View    Standing Status: Future     Number of Occurrences:      Standing Expiration Date: 04/22/2017    Order Specific Question:  Reason for Exam (SYMPTOM  OR DIAGNOSIS REQUIRED)    Answer:  shortness of breath    Order Specific Question:  Preferred imaging location?    Answer:  [facility]  . Hepatitis C antibody, reflex    [facility]  . Hemoglobin A1c    [place]  . CBC with Differential/Platelet  . Comprehensive metabolic panel    [place]  . TSH    [place]  . Ambulatory referral to Neurology    Referral Priority:  Routine    Referral Type:  Consultation    Referral Reason:  Specialty Services Required    Requested Specialty:  Neurology    Number of Visits Requested:  1  . POCT Urinalysis Dipstick (Automated)  . EKG 12-Lead    Order Specific Question:  Where should this test be performed    Answer:  Other  . ECHOCARDIOGRAM COMPLETE    Standing Status: Future     Number of Occurrences:      Standing Expiration Date: 05/23/2017    Order Specific Question:  Where should this test be performed    Answer:  [facility])    Order Specific Question:  Does the patient weigh less than or greater than 250 lbs?    Answer:  Patient weighs less than 250 lbs    Order Specific Question:  Complete or Limited study?    Answer:  Complete    Order Specific Question:  With Image Enhancing Agent or without Image Enhancing Agent?    Answer:  With Image Enhancing Agent    Order Specific Question:  Reason for exam-Echo    Answer:  Dyspnea  786.09 / R06.00

## 2016-02-22 DIAGNOSIS — H02832 Dermatochalasis of right lower eyelid: Secondary | ICD-10-CM | POA: Diagnosis not present

## 2016-02-22 DIAGNOSIS — H02834 Dermatochalasis of left upper eyelid: Secondary | ICD-10-CM | POA: Diagnosis not present

## 2016-02-22 DIAGNOSIS — H524 Presbyopia: Secondary | ICD-10-CM | POA: Diagnosis not present

## 2016-02-22 DIAGNOSIS — H02831 Dermatochalasis of right upper eyelid: Secondary | ICD-10-CM | POA: Diagnosis not present

## 2016-02-22 DIAGNOSIS — H02835 Dermatochalasis of left lower eyelid: Secondary | ICD-10-CM | POA: Diagnosis not present

## 2016-02-22 DIAGNOSIS — H04123 Dry eye syndrome of bilateral lacrimal glands: Secondary | ICD-10-CM | POA: Diagnosis not present

## 2016-02-22 DIAGNOSIS — H26493 Other secondary cataract, bilateral: Secondary | ICD-10-CM | POA: Diagnosis not present

## 2016-02-22 LAB — HEPATITIS C ANTIBODY: HCV Ab: NEGATIVE

## 2016-02-24 ENCOUNTER — Other Ambulatory Visit: Payer: Self-pay | Admitting: Family Medicine

## 2016-02-24 LAB — URINE CULTURE: Colony Count: >=100000

## 2016-02-24 MED ORDER — NITROFURANTOIN MONOHYD MACRO 100 MG PO CAPS: 100.0000 mg | ORAL_CAPSULE | Freq: Two times a day (BID) | ORAL | Status: DC

## 2016-02-28 ENCOUNTER — Ambulatory Visit (HOSPITAL_BASED_OUTPATIENT_CLINIC_OR_DEPARTMENT_OTHER)
Admission: RE | Admit: 2016-02-28 | Discharge: 2016-02-28 | Disposition: A | Discharge status: Home / Self Care | Payer: Medicare Other | Source: Ambulatory Visit | Attending: Family Medicine | Admitting: Family Medicine

## 2016-02-28 ENCOUNTER — Telehealth: Payer: Self-pay | Admitting: Family Medicine

## 2016-02-28 DIAGNOSIS — R0602 Shortness of breath: Secondary | ICD-10-CM

## 2016-02-28 DIAGNOSIS — R918 Other nonspecific abnormal finding of lung field: Secondary | ICD-10-CM | POA: Diagnosis not present

## 2016-02-28 DIAGNOSIS — J849 Interstitial pulmonary disease, unspecified: Secondary | ICD-10-CM

## 2016-02-28 NOTE — Telephone Encounter (Signed)
Ordered CT chest to follow up x-ray results- informed patient we would call her. She wants this done at [facility]   She also wants prior echocardiogram ordered for [facility] instead of [place]  [employer]- can you help me out

## 2016-03-04 NOTE — Telephone Encounter (Signed)
Placed request on Deborah's desk

## 2016-03-05 ENCOUNTER — Other Ambulatory Visit: Payer: Self-pay | Admitting: Family Medicine

## 2016-03-05 ENCOUNTER — Ambulatory Visit (HOSPITAL_BASED_OUTPATIENT_CLINIC_OR_DEPARTMENT_OTHER)
Admission: RE | Admit: 2016-03-05 | Discharge: 2016-03-05 | Disposition: A | Discharge status: Home / Self Care | Payer: Medicare Other | Source: Ambulatory Visit | Attending: Family Medicine | Admitting: Family Medicine

## 2016-03-05 DIAGNOSIS — R0600 Dyspnea, unspecified: Secondary | ICD-10-CM | POA: Diagnosis present

## 2016-03-05 DIAGNOSIS — R928 Other abnormal and inconclusive findings on diagnostic imaging of breast: Secondary | ICD-10-CM

## 2016-03-05 DIAGNOSIS — I071 Rheumatic tricuspid insufficiency: Secondary | ICD-10-CM | POA: Diagnosis not present

## 2016-03-05 DIAGNOSIS — R0602 Shortness of breath: Secondary | ICD-10-CM | POA: Diagnosis not present

## 2016-03-05 DIAGNOSIS — I351 Nonrheumatic aortic (valve) insufficiency: Secondary | ICD-10-CM | POA: Insufficient documentation

## 2016-03-05 LAB — ECHOCARDIOGRAM COMPLETE
E decel time: 250 msec
E/e' ratio: 11.84
FS: 34 % (ref 28–44)
IVS/LV PW RATIO, ED: 0.96
LA ID, A-P, ES: 37 mm
LA diam end sys: 37 mm
LA diam index: 1.93 cm/m2
LA vol A4C: 40.7 ml
LA vol index: 23.8 mL/m2
LA vol: 45.6 mL
LV E/e' medial: 11.84
LV E/e'average: 11.84
LV PW d: 9.91 mm — AB (ref 0.6–1.1)
LV e' LATERAL: 8.7 cm/s
LVOT area: 2.01 cm2
LVOT diameter: 16 mm
MV Dec: 250
MV Peak grad: 4 mmHg
MV pk A vel: 98.5 m/s
MV pk E vel: 103 m/s
Reg peak vel: 258 cm/s
TAPSE: 23.6 mm
TDI e' lateral: 8.7
TDI e' medial: 6.85
TR max vel: 258 cm/s

## 2016-03-05 NOTE — Progress Notes (Signed)
  Echocardiogram 2D Echocardiogram has been performed.  Johny Chess 03/05/2016, 2:59 PM

## 2016-03-10 ENCOUNTER — Ambulatory Visit
Admission: RE | Admit: 2016-03-10 | Discharge: 2016-03-10 | Disposition: A | Discharge status: Home / Self Care | Payer: Medicare Other | Source: Ambulatory Visit | Attending: Family Medicine | Admitting: Family Medicine

## 2016-03-10 ENCOUNTER — Other Ambulatory Visit: Payer: Self-pay | Admitting: Family Medicine

## 2016-03-10 DIAGNOSIS — R0602 Shortness of breath: Secondary | ICD-10-CM

## 2016-03-10 DIAGNOSIS — J849 Interstitial pulmonary disease, unspecified: Secondary | ICD-10-CM

## 2016-03-10 DIAGNOSIS — J984 Other disorders of lung: Secondary | ICD-10-CM | POA: Diagnosis not present

## 2016-03-10 MED ORDER — IOPAMIDOL (ISOVUE-300) INJECTION 61%
75.0000 mL | Freq: Once | INTRAVENOUS | Status: AC | PRN
  Administered 2016-03-10: 75 mL via INTRAVENOUS

## 2016-03-11 ENCOUNTER — Ambulatory Visit
Admission: RE | Admit: 2016-03-11 | Discharge: 2016-03-11 | Disposition: A | Discharge status: Home / Self Care | Payer: Medicare Other | Source: Ambulatory Visit | Attending: Family Medicine | Admitting: Family Medicine

## 2016-03-11 ENCOUNTER — Other Ambulatory Visit: Payer: Self-pay | Admitting: Family Medicine

## 2016-03-11 ENCOUNTER — Encounter: Payer: Self-pay | Admitting: Family Medicine

## 2016-03-11 ENCOUNTER — Ambulatory Visit (INDEPENDENT_AMBULATORY_CARE_PROVIDER_SITE_OTHER): Payer: Medicare Other | Admitting: Family Medicine

## 2016-03-11 ENCOUNTER — Other Ambulatory Visit (HOSPITAL_COMMUNITY): Payer: Medicare Other

## 2016-03-11 VITALS — BP 130/78 | HR 64 | Temp 98.3°F | Ht 64.0 in | Wt 189.0 lb

## 2016-03-11 DIAGNOSIS — N6320 Unspecified lump in the left breast, unspecified quadrant: Secondary | ICD-10-CM

## 2016-03-11 DIAGNOSIS — G4719 Other hypersomnia: Secondary | ICD-10-CM | POA: Diagnosis not present

## 2016-03-11 DIAGNOSIS — R928 Other abnormal and inconclusive findings on diagnostic imaging of breast: Secondary | ICD-10-CM

## 2016-03-11 DIAGNOSIS — R0602 Shortness of breath: Secondary | ICD-10-CM | POA: Diagnosis not present

## 2016-03-11 DIAGNOSIS — R0683 Snoring: Secondary | ICD-10-CM

## 2016-03-11 DIAGNOSIS — N63 Unspecified lump in breast: Secondary | ICD-10-CM | POA: Diagnosis not present

## 2016-03-11 LAB — CBC WITH DIFFERENTIAL/PLATELET
Basophils Absolute: 0 10*3/uL (ref 0.0–0.1)
Basophils Relative: 0.3 % (ref 0.0–3.0)
Eosinophils Absolute: 0.3 10*3/uL (ref 0.0–0.7)
Eosinophils Relative: 2.5 % (ref 0.0–5.0)
HCT: 44.8 % (ref 36.0–46.0)
Hemoglobin: 14.9 g/dL (ref 12.0–15.0)
Lymphocytes Relative: 20.5 % (ref 12.0–46.0)
Lymphs Abs: 2.2 10*3/uL (ref 0.7–4.0)
MCHC: 33.3 g/dL (ref 30.0–36.0)
MCV: 92.6 fl (ref 78.0–100.0)
Monocytes Absolute: 0.6 10*3/uL (ref 0.1–1.0)
Monocytes Relative: 5.3 % (ref 3.0–12.0)
Neutro Abs: 7.7 10*3/uL (ref 1.4–7.7)
Neutrophils Relative %: 71.4 % (ref 43.0–77.0)
Platelets: 303 10*3/uL (ref 150.0–400.0)
RBC: 4.83 Mil/uL (ref 3.87–5.11)
RDW: 14.9 % (ref 11.5–15.5)
WBC: 10.7 10*3/uL — ABNORMAL HIGH (ref 4.0–10.5)
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
## 2016-03-11 NOTE — Progress Notes (Signed)
Pre visit review using our clinic review tool, if applicable. No additional management support is needed unless otherwise documented below in the visit note. 
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
## 2016-03-11 NOTE — Patient Instructions (Signed)
Repeat labs before you go to check on infection fighting cells  We will call you within a week about your referral to cardiology. I asked for Dr. Radford Pax who does sleep medicine as well- we will hope you get an appointment with her. If you do not hear within 2 weeks, give [place] a call.   Would take it easy and stop if you get shortness of breath. Seek care immediately if worsening symptoms  See me back in about 3 months so we can check in on cough and discuss ordering repeat CT scan of chest

## 2016-03-14 ENCOUNTER — Other Ambulatory Visit: Payer: Self-pay | Admitting: Family Medicine

## 2016-03-14 DIAGNOSIS — N6320 Unspecified lump in the left breast, unspecified quadrant: Secondary | ICD-10-CM

## 2016-03-17 ENCOUNTER — Ambulatory Visit
Admission: RE | Admit: 2016-03-17 | Discharge: 2016-03-17 | Disposition: A | Discharge status: Home / Self Care | Payer: Medicare Other | Source: Ambulatory Visit | Attending: Family Medicine | Admitting: Family Medicine

## 2016-03-17 DIAGNOSIS — N6012 Diffuse cystic mastopathy of left breast: Secondary | ICD-10-CM | POA: Diagnosis not present

## 2016-03-17 DIAGNOSIS — N6320 Unspecified lump in the left breast, unspecified quadrant: Secondary | ICD-10-CM

## 2016-03-17 DIAGNOSIS — N63 Unspecified lump in breast: Secondary | ICD-10-CM | POA: Diagnosis not present

## 2016-04-01 DIAGNOSIS — H26492 Other secondary cataract, left eye: Secondary | ICD-10-CM | POA: Diagnosis not present

## 2016-04-01 DIAGNOSIS — H04123 Dry eye syndrome of bilateral lacrimal glands: Secondary | ICD-10-CM | POA: Diagnosis not present

## 2016-04-01 DIAGNOSIS — H02831 Dermatochalasis of right upper eyelid: Secondary | ICD-10-CM | POA: Diagnosis not present

## 2016-04-01 DIAGNOSIS — H02834 Dermatochalasis of left upper eyelid: Secondary | ICD-10-CM | POA: Diagnosis not present

## 2016-04-01 DIAGNOSIS — H524 Presbyopia: Secondary | ICD-10-CM | POA: Diagnosis not present

## 2016-04-02 ENCOUNTER — Telehealth: Payer: Self-pay | Admitting: Family Medicine

## 2016-04-02 NOTE — Telephone Encounter (Signed)
Pt needs refill on generic amethyst 90-20 mcg and bisoprolol-hctz 2.5-6.25 #90 each w/refills send to [facility] order pharm

## 2016-04-08 ENCOUNTER — Other Ambulatory Visit: Payer: Self-pay

## 2016-04-08 MED ORDER — LEVONORGESTREL-ETHINYL ESTRAD 90-20 MCG PO TABS: ORAL_TABLET | ORAL | 3 refills | Status: DC

## 2016-04-08 MED ORDER — BISOPROLOL-HYDROCHLOROTHIAZIDE 2.5-6.25 MG PO TABS: 1.0000 | ORAL_TABLET | Freq: Every day | ORAL | 3 refills | Status: DC

## 2016-04-08 NOTE — Telephone Encounter (Signed)
Refill request sent to pharmacy. Patient made aware.

## 2016-04-17 ENCOUNTER — Ambulatory Visit: Payer: Medicare Other | Admitting: Neurology

## 2016-05-22 ENCOUNTER — Encounter: Payer: Self-pay | Admitting: Cardiology

## 2016-06-05 ENCOUNTER — Ambulatory Visit (INDEPENDENT_AMBULATORY_CARE_PROVIDER_SITE_OTHER): Payer: Medicare Other | Admitting: Cardiology

## 2016-06-05 ENCOUNTER — Encounter: Payer: Self-pay | Admitting: Cardiology

## 2016-06-05 ENCOUNTER — Encounter (INDEPENDENT_AMBULATORY_CARE_PROVIDER_SITE_OTHER): Payer: Self-pay

## 2016-06-05 VITALS — BP 120/78 | HR 70 | Ht 64.0 in | Wt 189.1 lb

## 2016-06-05 DIAGNOSIS — R0602 Shortness of breath: Secondary | ICD-10-CM | POA: Diagnosis not present

## 2016-06-05 DIAGNOSIS — I10 Essential (primary) hypertension: Secondary | ICD-10-CM

## 2016-06-05 DIAGNOSIS — R0683 Snoring: Secondary | ICD-10-CM

## 2016-06-05 DIAGNOSIS — G4719 Other hypersomnia: Secondary | ICD-10-CM

## 2016-06-05 HISTORY — DX: Other hypersomnia: G47.19
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
## 2016-06-05 NOTE — Patient Instructions (Signed)
Medication Instructions:  Your physician recommends that you continue on your current medications as directed. Please refer to the Current Medication list given to you today.   Labwork: -None  Testing/Procedures: Your physician has requested that you have en exercise stress myoview. For further information please visit [email or web address]. Please follow instruction sheet, as given.  Your physician has recommended that you have a sleep study. This test records several body functions during sleep, including: brain activity, eye movement, oxygen and carbon dioxide blood levels, heart rate and rhythm, breathing rate and rhythm, the flow of air through your mouth and nose, snoring, body muscle movements, and chest and belly movement.    Follow-Up: Follow up as needed  Any Other Special Instructions Will Be Listed Below (If Applicable).     If you need a refill on your cardiac medications before your next appointment, please call your pharmacy.

## 2016-06-06 ENCOUNTER — Telehealth (HOSPITAL_COMMUNITY): Payer: Self-pay | Admitting: *Deleted

## 2016-06-06 NOTE — Telephone Encounter (Signed)
Patient given detailed instructions per Myocardial Perfusion Study Information Sheet for the test on 06/10/16 at 7:45. Patient notified to arrive 15 minutes early and that it is imperative to arrive on time for appointment to keep from having the test rescheduled.  If you need to cancel or reschedule your appointment, please call the office within 24 hours of your appointment. Failure to do so may result in a cancellation of your appointment, and a $50 no show fee. Patient verbalized understanding.Kelly Frazier

## 2016-06-09 ENCOUNTER — Ambulatory Visit: Payer: Medicare Other | Admitting: Family Medicine

## 2016-06-10 ENCOUNTER — Ambulatory Visit (HOSPITAL_COMMUNITY): Payer: Medicare Other | Attending: Cardiology

## 2016-06-10 DIAGNOSIS — R0602 Shortness of breath: Secondary | ICD-10-CM | POA: Diagnosis not present

## 2016-06-10 DIAGNOSIS — I10 Essential (primary) hypertension: Secondary | ICD-10-CM | POA: Insufficient documentation

## 2016-06-10 DIAGNOSIS — R0609 Other forms of dyspnea: Secondary | ICD-10-CM | POA: Diagnosis not present

## 2016-06-10 LAB — MYOCARDIAL PERFUSION IMAGING
Estimated workload: 7 METS
Exercise duration (min): 5 min
Exercise duration (sec): 0 s
LV dias vol: 72 mL (ref 46–106)
LV sys vol: 18 mL
MPHR: 152 {beats}/min
Peak HR: 134 {beats}/min
Percent HR: 88 %
RATE: 0.4
RPE: 18
Rest HR: 63 {beats}/min
SDS: 0
SRS: 1
SSS: 1
TID: 1.2

## 2016-06-10 MED ORDER — TECHNETIUM TC 99M TETROFOSMIN IV KIT
32.6000 | PACK | Freq: Once | INTRAVENOUS | Status: AC | PRN
  Administered 2016-06-10: 33 via INTRAVENOUS
  Filled 2016-06-10: qty 33

## 2016-06-10 MED ORDER — TECHNETIUM TC 99M TETROFOSMIN IV KIT
11.0000 | PACK | Freq: Once | INTRAVENOUS | Status: AC | PRN
  Administered 2016-06-10: 11 via INTRAVENOUS
  Filled 2016-06-10: qty 11

## 2016-06-16 ENCOUNTER — Encounter: Payer: Self-pay | Admitting: Family Medicine

## 2016-06-16 ENCOUNTER — Ambulatory Visit (INDEPENDENT_AMBULATORY_CARE_PROVIDER_SITE_OTHER): Payer: Medicare Other | Admitting: Family Medicine

## 2016-06-16 VITALS — BP 120/78 | HR 63 | Temp 97.9°F | Wt 190.2 lb

## 2016-06-16 DIAGNOSIS — R0602 Shortness of breath: Secondary | ICD-10-CM

## 2016-06-16 DIAGNOSIS — Z23 Encounter for immunization: Secondary | ICD-10-CM

## 2016-06-16 NOTE — Patient Instructions (Signed)
We will call you within a week about your referral for CT san. If you do not hear within 2 weeks, give [place] a call.   When schedule looks better- reach out to Dr. Radford Pax about getting your sleep studay done
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
## 2016-06-16 NOTE — Progress Notes (Signed)
Pre visit review using our clinic review tool, if applicable. No additional management support is needed unless otherwise documented below in the visit note. 
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
## 2016-07-11 ENCOUNTER — Telehealth: Payer: Self-pay | Admitting: Family Medicine

## 2016-07-11 NOTE — Telephone Encounter (Signed)
Called and left a voicemail message letting her now we have Zostavax here in the office

## 2016-07-11 NOTE — Telephone Encounter (Signed)
Pt would like to know if you have the new Shingles vaccine in  (Shingrix) if so the pt would like to get it.

## 2016-07-18 ENCOUNTER — Encounter: Payer: Self-pay | Admitting: Gastroenterology

## 2016-07-29 ENCOUNTER — Other Ambulatory Visit: Payer: Medicare Other

## 2016-08-03 ENCOUNTER — Ambulatory Visit (HOSPITAL_BASED_OUTPATIENT_CLINIC_OR_DEPARTMENT_OTHER): Payer: Medicare Other | Attending: Cardiology | Admitting: Cardiology

## 2016-08-03 DIAGNOSIS — Z79899 Other long term (current) drug therapy: Secondary | ICD-10-CM | POA: Insufficient documentation

## 2016-08-03 DIAGNOSIS — R0602 Shortness of breath: Secondary | ICD-10-CM | POA: Insufficient documentation

## 2016-08-03 DIAGNOSIS — R0683 Snoring: Secondary | ICD-10-CM | POA: Diagnosis not present

## 2016-08-03 DIAGNOSIS — G4719 Other hypersomnia: Secondary | ICD-10-CM | POA: Diagnosis not present

## 2016-08-11 NOTE — Procedures (Signed)
   Patient Name: Kelly Frazier, Kelly Frazier Date: 08/03/2016 Gender: Female D.O.B: Mar 11, 1946 Age (years): 24 Referring Provider: Fransico Him MD, ABSM Height (inches): 64 Interpreting Physician: Fransico Him MD, ABSM Weight (lbs): 185 RPSGT: Earney [place] BMI: 32 MRN: [id number] Neck Size: 15.25  CLINICAL INFORMATION Sleep Study Type: NPSG Indication for sleep study: Snoring  SLEEP STUDY TECHNIQUE As per the AASM Manual for the Scoring of Sleep and Associated Events v2.3 (April 2016) with a hypopnea requiring 4% desaturations. The channels recorded and monitored were frontal, central and occipital EEG, electrooculogram (EOG), submentalis EMG (chin), nasal and oral airflow, thoracic and abdominal wall motion, anterior tibialis EMG, snore microphone, electrocardiogram, and pulse oximetry.  MEDICATIONS Medications self-administered by patient taken the night of the study : ADVIL, BISOPROLOL/HCTZ, OMNPRAZOLE  SLEEP ARCHITECTURE The study was initiated at 9:50:03 PM and ended at 4:37:38 AM. Sleep onset time was 115.8 minutes and the sleep efficiency was 46.7%. The total sleep time was 190.5 minutes. Stage REM latency was 282.0 minutes. The patient spent 4.46% of the night in stage N1 sleep, 90.81% in stage N2 sleep, 0.00% in stage N3 and 4.72% in REM. Alpha intrusion was absent. Supine sleep was 66.14%.  RESPIRATORY PARAMETERS The overall apnea/hypopnea index (AHI) was 4.4 per hour. There were 1 total apneas, including 1 obstructive, 0 central and 0 mixed apneas. There were 13 hypopneas and 12 RERAs. The AHI during Stage REM sleep was 20.0 per hour. AHI while supine was 5.2 per hour. The mean oxygen saturation was 91.05%. The minimum SpO2 during sleep was 87.00%. Loud snoring was noted during this study.  CARDIAC DATA The 2 lead EKG demonstrated sinus rhythm. The mean heart rate was 62.21 beats per minute. Other EKG findings include: None.  LEG MOVEMENT DATA The total PLMS  were 0 with a resulting PLMS index of 0.00. Associated arousal with leg movement index was 0.0 .  IMPRESSIONS - No significant obstructive sleep apnea occurred during this study (AHI = 4.4/h). - No significant central sleep apnea occurred during this study (CAI = 0.0/h). - Oxygen desaturation was noted during this study (Min O2 = 87.00%). - The patient snored with Loud snoring volume. - No cardiac abnormalities were noted during this study. - Clinically significant periodic limb movements did not occur during sleep. No significant associated arousals.  DIAGNOSIS - Snoring  RECOMMENDATIONS - Avoid alcohol, sedatives and other CNS depressants that may worsen sleep apnea and disrupt normal sleep architecture. - Sleep hygiene should be reviewed to assess factors that may improve sleep quality. - Weight management and regular exercise should be initiated or continued if appropriate  .[place], American Board of Sleep Medicine  ELECTRONICALLY SIGNED ON:  08/11/2016, 7:27 PM [place] PH: [phone]   FX: [phone] [place]

## 2016-08-13 ENCOUNTER — Telehealth: Payer: Self-pay | Admitting: *Deleted

## 2016-08-13 NOTE — Telephone Encounter (Signed)
Spoke to the patient she verbalized understanding 

## 2016-08-13 NOTE — Telephone Encounter (Signed)
-----   Message from Sueanne Margarita, MD sent at 08/11/2016  7:30 PM EST ----- Please let patient know that sleep study showed no significant sleep apnea.

## 2016-08-29 ENCOUNTER — Other Ambulatory Visit: Payer: Self-pay | Admitting: Family Medicine

## 2017-01-05 ENCOUNTER — Encounter: Payer: Self-pay | Admitting: Family Medicine

## 2017-01-05 ENCOUNTER — Ambulatory Visit (INDEPENDENT_AMBULATORY_CARE_PROVIDER_SITE_OTHER): Payer: Medicare Other | Admitting: Family Medicine

## 2017-01-05 VITALS — BP 126/62 | HR 83 | Temp 97.7°F | Ht 64.0 in | Wt 187.0 lb

## 2017-01-05 DIAGNOSIS — R0602 Shortness of breath: Secondary | ICD-10-CM | POA: Diagnosis not present

## 2017-01-05 DIAGNOSIS — M5412 Radiculopathy, cervical region: Secondary | ICD-10-CM | POA: Diagnosis not present

## 2017-01-05 MED ORDER — ZOSTER VAC RECOMB ADJUVANTED 50 MCG/0.5ML IM SUSR: 0.5000 mL | Freq: Once | INTRAMUSCULAR | 1 refills | Status: AC

## 2017-01-05 NOTE — Patient Instructions (Addendum)
We will call you within a week or two about your referral to PT and for CT of chest. If you do not hear within 3 weeks, give [place] a call.   Please stop by lab before you go  Please make sure to keep the CT appointment this time. I ordered them both as [place]  Appears you need a colonoscopy Health Maintenance Due  Topic Date Due  . COLONOSCOPY  08/17/2016    I would also like for you to sign up for an annual wellness visit with our nurse, Manuela Schwartz, who specializes in the annual wellness exam. This is a free benefit under medicare that may help [place] find additional ways to help you. Some highlights are reviewing medications, lifestyle, and doing a dementia screen.

## 2017-01-05 NOTE — Addendum Note (Signed)
Addended by: Marin Olp on: 01/05/2017 04:13 PM   Modules accepted: Orders

## 2017-01-05 NOTE — Progress Notes (Addendum)
Subjective:  Kelly Frazier is a 71 y.o. year old very pleasant female patient who presents for/with See problem oriented charting ROS- slight baseline shortness of breath but not worsening. No chest pain. No abnormal fatigue. Has left arm and neck pain without exertional component   Past Medical History-  Patient Active Problem List   Diagnosis Date Noted  . Migraine 08/17/2014    Priority: Medium  . Hyperlipidemia 05/03/2007    Priority: Medium  . Essential hypertension 05/03/2007    Priority: Medium  . Hypothyroidism 04/12/2007    Priority: Medium  . Esophageal reflux 09/03/2010    Priority: Low  . Angioedema 06/21/2010    Priority: Low  . History of colonic polyps 09/27/2007    Priority: Low  . Osteoarthritis 04/12/2007    Priority: Low  . Snoring 06/05/2016  . Excessive daytime sleepiness 06/05/2016  . Shortness of breath 03/11/2016    Medications- reviewed and updated Current Outpatient Prescriptions  Medication Sig Dispense Refill  . bisoprolol-hydrochlorothiazide (ZIAC) 2.5-6.25 MG tablet Take 1 tablet by mouth daily. 90 tablet 3  . eletriptan (RELPAX) 40 MG tablet One tablet by mouth as needed for migraine headache.  If the headache improves and then returns, dose may be repeated after 2 hours have elapsed since first dose (do not exceed 80 mg per day). may repeat in 2 hours if necessary     . EPINEPHrine (EPIPEN JR) 0.15 MG/0.3ML injection Inject 0.15 mg into the muscle as needed.      Marland Kitchen ibuprofen (ADVIL,MOTRIN) 200 MG tablet Take 200 mg by mouth every 6 (six) hours as needed for headache, mild pain or cramping.     Marland Kitchen levothyroxine (SYNTHROID, LEVOTHROID) 75 MCG tablet Take 75 mcg by mouth daily before breakfast.    . omeprazole (PRILOSEC) 20 MG capsule TAKE 1 CAPSULE EVERY DAY 90 capsule 3  . Zoster Vac Recomb Adjuvanted [facility]) injection Inject 0.5 mLs into the muscle once. Repeat 2-3 months 0.5 mL 1   No current facility-administered medications for this  visit.     Objective: BP 126/62 (BP Location: Left Arm, Patient Position: Sitting, Cuff Size: Large)   Pulse 83   Temp 97.7 F (36.5 C) (Oral)   Ht 5\' 4"  (1.626 m)   Wt 187 lb (84.8 kg)   SpO2 94%   BMI 32.10 kg/m  Gen: NAD, resting comfortably CV: RRR no murmurs rubs or gallops Lungs: CTAB no crackles, wheeze, rhonchi Ext: no edema Skin: warm, dry Neuro: positive spurling on left, 5/5 strength bilateral upper extremities  Assessment/Plan:  Left cervical radiculopathy - Plan: Ambulatory referral to Physical Therapy S: for 2-3 weeks dealing with left neck and shoulder pain - radiating to about mid upper arm. Started after pulling weeds for 45 minutes. Pain in almost any position- laying, sitting, standing. Then calmed down- if moved a certain way felt pain worsen. Cervical disc issues for years. Pain level perhaps 1/10 or so at this point but had been 8-9/10. Left handed so used that hand mainly.  No exertional symptoms. No sweating. No shortness of breath increased from baseline  MRI from 07/06/2004  "The most impressive findings given the patient's history are the osteophytic formation and the disk protrusion posterolaterally/laterally on the right at C4-5 and C5-6.   See comments above for additional findings. " A/P: pain so minimal at this point- unclear that prednisone would help. We opted for PT referral in [place] which has helped her previously  Left cervical radiculopathy - Plan: Ambulatory  referral to Physical Therapy  Shortness of breath - Plan: CT Chest W Contrast Prior shortness of breath and CT with multiple lymph nodes- 3 month repeat suggested from 02/2016. She cancelled appointment in October/November. We will reschedule  Orders Placed This Encounter  Procedures  . CT Chest W Contrast    Standing Status:   Future    Standing Expiration Date:   03/07/2018    Order Specific Question:   If indicated for the ordered procedure, I authorize the administration of  contrast media per Radiology protocol    Answer:   Yes    Order Specific Question:   Reason for Exam (SYMPTOM  OR DIAGNOSIS REQUIRED)    Answer:   shortness of breath, follow up numerous lymph nodes prior CT    Order Specific Question:   Preferred imaging location?    Answer:   [facility] Specific Question:   Radiology Contrast Protocol - do NOT remove file path    Answer:   \\[email or web address].pdf  . Basic metabolic panel    [place]  . Ambulatory referral to Physical Therapy    Referral Priority:   Routine    Referral Type:   Physical Medicine    Referral Reason:   Specialty Services Required    Requested Specialty:   Physical Therapy    Number of Visits Requested:   1   The duration of face-to-face time during this visit was greater than 25 minutes. Greater than 50% of this time was spent in counseling, explanation of diagnosis, planning of further management, and/or coordination of care including counseling about need for following through with CT, discussion of prednisone vs. Therapy, need for shingrix though had zostavax Immunization History  Administered Date(s) Administered  . Influenza Split 06/23/2011  . Influenza Whole 08/31/2007, 06/27/2008, 06/21/2009, 06/21/2010, 07/31/2010  . Influenza, High Dose Seasonal PF 06/16/2016  . Influenza,inj,Quad PF,36+ Mos 06/14/2013, 06/28/2014, 06/28/2015  . Pneumococcal Conjugate-13 02/21/2016  . Pneumococcal Polysaccharide-23 09/14/2013  . Td 09/15/2005, 11/24/2014  . Zoster 12/31/2007       Return precautions advised.  Garret Reddish, MD

## 2017-01-05 NOTE — Progress Notes (Signed)
Pre visit review using our clinic review tool, if applicable. No additional management support is needed unless otherwise documented below in the visit note. 

## 2017-01-06 LAB — BASIC METABOLIC PANEL
BUN: 11 mg/dL (ref 6–23)
CO2: 29 mEq/L (ref 19–32)
Calcium: 9.5 mg/dL (ref 8.4–10.5)
Chloride: 104 mEq/L (ref 96–112)
Creatinine, Ser: 0.62 mg/dL (ref 0.40–1.20)
GFR: 101.38 mL/min (ref >60.00)
Glucose, Bld: 89 mg/dL (ref 70–99)
Potassium: 3.7 mEq/L (ref 3.5–5.1)
Sodium: 142 mEq/L (ref 135–145)

## 2017-01-09 ENCOUNTER — Ambulatory Visit (INDEPENDENT_AMBULATORY_CARE_PROVIDER_SITE_OTHER)
Admission: RE | Admit: 2017-01-09 | Discharge: 2017-01-09 | Disposition: A | Discharge status: Home / Self Care | Payer: Medicare Other | Source: Ambulatory Visit | Attending: Family Medicine | Admitting: Family Medicine

## 2017-01-09 DIAGNOSIS — R0602 Shortness of breath: Secondary | ICD-10-CM | POA: Diagnosis not present

## 2017-01-09 DIAGNOSIS — R590 Localized enlarged lymph nodes: Secondary | ICD-10-CM | POA: Diagnosis not present

## 2017-01-09 MED ORDER — IOPAMIDOL (ISOVUE-300) INJECTION 61%
80.0000 mL | Freq: Once | INTRAVENOUS | Status: AC | PRN
  Administered 2017-01-09: 80 mL via INTRAVENOUS

## 2017-01-10 ENCOUNTER — Encounter: Payer: Self-pay | Admitting: Family Medicine

## 2017-01-10 DIAGNOSIS — R911 Solitary pulmonary nodule: Secondary | ICD-10-CM | POA: Insufficient documentation

## 2017-01-13 ENCOUNTER — Ambulatory Visit: Payer: Medicare Other | Attending: Family Medicine | Admitting: Physical Therapy

## 2017-01-13 DIAGNOSIS — R293 Abnormal posture: Secondary | ICD-10-CM | POA: Diagnosis not present

## 2017-01-13 DIAGNOSIS — M542 Cervicalgia: Secondary | ICD-10-CM

## 2017-01-13 DIAGNOSIS — M5412 Radiculopathy, cervical region: Secondary | ICD-10-CM | POA: Diagnosis not present

## 2017-01-13 DIAGNOSIS — M6281 Muscle weakness (generalized): Secondary | ICD-10-CM | POA: Insufficient documentation
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
## 2017-01-14 ENCOUNTER — Telehealth: Payer: Self-pay | Admitting: Family Medicine

## 2017-01-14 NOTE — Telephone Encounter (Signed)
Pt needs CT Chest for follow up of pulmonary nodule 2020.

## 2017-01-15 ENCOUNTER — Telehealth: Payer: Self-pay

## 2017-01-15 NOTE — Telephone Encounter (Signed)
Spoke with patient who verbalized understanding. I recommended reaching out to her physical therapist and she verbalized understanding

## 2017-01-15 NOTE — Telephone Encounter (Signed)
-----   Message from Marin Olp, MD sent at 01/14/2017  4:54 PM EDT ----- Regarding: RE: Cervical CT I would tell patient to follow up if she is not improving with physical therapy within next few weeks or if she worsens  Garret Reddish  ----- Message ----- From: Hulda Humphrey, CMA Sent: 01/14/2017   3:25 PM To: Marin Olp, MD Subject: Cervical CT                                    Informed pt of Chest CT results.  She would like to know if you recommend a follow up of her neck.  Please call her or have [facility] call.  I am out of the office Thursday and Friday.  Thanks!!

## 2017-01-16 ENCOUNTER — Ambulatory Visit: Payer: Medicare Other

## 2017-01-16 DIAGNOSIS — R293 Abnormal posture: Secondary | ICD-10-CM

## 2017-01-16 DIAGNOSIS — M5412 Radiculopathy, cervical region: Secondary | ICD-10-CM | POA: Diagnosis not present

## 2017-01-16 DIAGNOSIS — M6281 Muscle weakness (generalized): Secondary | ICD-10-CM | POA: Diagnosis not present

## 2017-01-16 DIAGNOSIS — M542 Cervicalgia: Secondary | ICD-10-CM

## 2017-01-16 NOTE — Therapy (Signed)
[facility] [address]  [place], [place], [address] Phone: [phone]   Fax:  [phone]  Physical Therapy Treatment  Patient Details  Name: Kelly Frazier MRN: [id number] Date of Birth: 12/17/1945 Referring Provider: Garret Reddish, MD  Encounter Date: 01/16/2017      PT End of Session - 01/16/17 1027    Visit Number 2   Number of Visits 16   Date for PT Re-Evaluation 03/13/17   Authorization Type Medicare & [employer]   PT Start Time 1017   PT Stop Time 1100   PT Time Calculation (min) 43 min   Activity Tolerance Patient tolerated treatment well   Behavior During Therapy [facility] for tasks assessed/performed      Past Medical History:  Diagnosis Date  . Arthritis   . Diverticulosis   . Excessive daytime sleepiness 06/05/2016  . Headache(784.0)   . Hx of colonic polyps   . Hyperlipidemia   . Hypertension   . Thyroid disease     Past Surgical History:  Procedure Laterality Date  . AXILLARY ABCESS IRRIGATION AND DEBRIDEMENT     cyst  . bcca from eyelid     basal cell 1980s.   . COLONOSCOPY  08/17/06    There were no vitals filed for this visit.      Subjective Assessment - 01/16/17 1020    Subjective Pt. reporting able to perform HEP twice since eval. without issue.  Noting neck much improved today pain free.     Patient Stated Goals "pain-free & ability to resume normal tasks"   Currently in Pain? Yes   Pain Score 2    Pain Orientation Anterior;Right   Pain Descriptors / Indicators Aching   Pain Type Acute pain   Multiple Pain Sites No                         [facility] Adult PT Treatment/Exercise - 01/16/17 1035      Self-Care   Self-Care Other Self-Care Comments;Posture   Posture educated pt on neutral spine and shoulder posture   Other Self-Care Comments  Review and demonstration of proper and improper posture with bending, stooping, standing with kitchen work, sitting  posture, standing posture; rationale for avoiding forward head, rounded shoulder, slumped posture throughout day with all activities; rationale behind changing positions frequently to avoid muscular fatigue      Neck Exercises: Theraband   Scapula Retraction 10 reps   Scapula Retraction Limitations 5" hold on pool noodle at wall    Horizontal ABduction 15 reps   Horizontal ABduction Limitations yellow TB; leaning on doorseal with scapular squeeze    Other Theraband Exercises B shoulder ER " x 10 reps with yellow TB leaning on pool noodle on wall   cues for scapular squeeze      Neck Exercises: Standing   Neck Retraction 10 reps;5 secs   Neck Retraction Limitations leaning up against pool noodle with scap. retraction    Other Standing Exercises --     Neck Exercises: Seated   Neck Retraction 10 reps;5 secs   Other Seated Exercise --     Neck Exercises: Stretches   Upper Trapezius Stretch 30 seconds   Upper Trapezius Stretch Limitations hand anchored on edge of seat to prevent shoulder hiking    Levator Stretch 30 seconds   Corner Stretch 30 seconds;3 reps   Corner Stretch Limitations 3 way doorway stretch  PT Education - 01/16/17 1034    Education provided Yes   Education Details Body mechanics and postural handout   Person(s) Educated Patient   Methods Explanation;Demonstration;Verbal cues;Handout   Comprehension Verbalized understanding;Returned demonstration;Verbal cues required;Need further instruction          PT Short Term Goals - 01/16/17 1027      PT SHORT TERM GOAL #1   Title Independent with initial HEP by 01/30/17   Status On-going     PT SHORT TERM GOAL #2   Title Pt will verbalize understanding of neutral spine and shoulder posture by 01/30/17   Status On-going           PT Long Term Goals - 01/16/17 1027      PT LONG TERM GOAL #1   Title Independent with advanced HEP as indicated by 03/13/17   Status On-going     PT LONG TERM  GOAL #2   Title Pt will routinely demostrate neutral spine and shoulder posture at least 75% of the time by 03/13/17   Status On-going     PT LONG TERM GOAL #3   Title Cervical ROM WFL w/o pain to allow pt to safely check blind spot while driving by 1/88/41   Status On-going     PT LONG TERM GOAL #4   Title B shoulder strength >/= 4+/5 by 03/13/17   Status On-going     PT LONG TERM GOAL #5   Title Pt will report ability to perform normal daily tasks/chores w/o limitation due to neck pain by 03/13/17   Status On-going               Plan - 01/16/17 1032    Clinical Impression Statement Pt. noting neck much improved today and she feels this is due to manual stretching etc last visit.  Pt. chief complaint today was mild pain in R biceps area.  Treatment focusing on HEP review, education on proper posture with ADL's, and gentle postural re-education.  Pt. tolerated all therex well without neck pain.  Proper body mechanics with gardening, stooping, housework, and kitchen work reviewed with pt. due to pt. report of some pain with these tasks.  Pt. verbalizing good understanding of proper sitting and standing posture as to avoid forward head and slumped shoulders.  Some carryover in treatment.  Will monitor radicular symptoms and may attempt traction in coming visits.     PT Treatment/Interventions Patient/family education;Neuromuscular re-education;Therapeutic exercise;Manual techniques;Passive range of motion;Taping;Dry needling;Therapeutic activities;Electrical Stimulation;Moist Heat;Cryotherapy;Traction;ADLs/Self Care Home Management   PT Next Visit Plan Traction?; manual therapy to address increased muscle tension and pain; postural strengthening; modalities PRN for pain; consider traction if radicular symptoms persist      Patient will benefit from skilled therapeutic intervention in order to improve the following deficits and impairments:  Pain, Postural dysfunction, Improper body  mechanics, Impaired flexibility, Increased muscle spasms, Decreased range of motion, Decreased strength, Impaired UE functional use  Visit Diagnosis: Cervicalgia  Radiculopathy, cervical region  Abnormal posture  Muscle weakness (generalized)     Problem List Patient Active Problem List   Diagnosis Date Noted  . Solitary pulmonary nodule 01/10/2017  . Snoring 06/05/2016  . Excessive daytime sleepiness 06/05/2016  . Shortness of breath 03/11/2016  . Migraine 08/17/2014  . Esophageal reflux 09/03/2010  . Angioedema 06/21/2010  . History of colonic polyps 09/27/2007  . Hyperlipidemia 05/03/2007  . Essential hypertension 05/03/2007  . Hypothyroidism 04/12/2007  . Osteoarthritis 04/12/2007    Bess Harvest, PTA 01/16/17  12:35 PM  [facility] [address]  [place], [place], [address] Phone: [phone]   Fax:  [phone]  Name: Kelly Frazier MRN: [id number] Date of Birth: 06/07/1946

## 2017-01-16 NOTE — Patient Instructions (Signed)

## 2017-01-19 ENCOUNTER — Ambulatory Visit: Payer: Medicare Other | Admitting: Physical Therapy

## 2017-01-19 DIAGNOSIS — M5412 Radiculopathy, cervical region: Secondary | ICD-10-CM | POA: Diagnosis not present

## 2017-01-19 DIAGNOSIS — R293 Abnormal posture: Secondary | ICD-10-CM

## 2017-01-19 DIAGNOSIS — M542 Cervicalgia: Secondary | ICD-10-CM | POA: Diagnosis not present

## 2017-01-19 DIAGNOSIS — M6281 Muscle weakness (generalized): Secondary | ICD-10-CM | POA: Diagnosis not present
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
## 2017-01-20 ENCOUNTER — Ambulatory Visit: Payer: Medicare Other | Admitting: Physical Therapy

## 2017-01-20 DIAGNOSIS — R293 Abnormal posture: Secondary | ICD-10-CM

## 2017-01-20 DIAGNOSIS — M6281 Muscle weakness (generalized): Secondary | ICD-10-CM | POA: Diagnosis not present

## 2017-01-20 DIAGNOSIS — M5412 Radiculopathy, cervical region: Secondary | ICD-10-CM

## 2017-01-20 DIAGNOSIS — M542 Cervicalgia: Secondary | ICD-10-CM
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
## 2017-01-26 ENCOUNTER — Ambulatory Visit: Payer: Medicare Other | Admitting: Physical Therapy

## 2017-01-26 DIAGNOSIS — M5412 Radiculopathy, cervical region: Secondary | ICD-10-CM

## 2017-01-26 DIAGNOSIS — M6281 Muscle weakness (generalized): Secondary | ICD-10-CM | POA: Diagnosis not present

## 2017-01-26 DIAGNOSIS — R293 Abnormal posture: Secondary | ICD-10-CM | POA: Diagnosis not present

## 2017-01-26 DIAGNOSIS — M542 Cervicalgia: Secondary | ICD-10-CM
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
## 2017-01-28 ENCOUNTER — Encounter: Payer: Self-pay | Admitting: Family Medicine

## 2017-01-28 ENCOUNTER — Ambulatory Visit (INDEPENDENT_AMBULATORY_CARE_PROVIDER_SITE_OTHER): Payer: Medicare Other | Admitting: Family Medicine

## 2017-01-28 ENCOUNTER — Ambulatory Visit: Payer: Medicare Other

## 2017-01-28 VITALS — BP 131/88 | HR 84 | Temp 100.6°F | Ht 64.0 in | Wt 184.0 lb

## 2017-01-28 DIAGNOSIS — J018 Other acute sinusitis: Secondary | ICD-10-CM | POA: Diagnosis not present

## 2017-01-28 MED ORDER — AZITHROMYCIN 250 MG PO TABS: ORAL_TABLET | ORAL | 0 refills | Status: DC

## 2017-01-28 NOTE — Patient Instructions (Signed)
WE NOW OFFER   [facility] FAST TRACK!!!  SAME DAY Appointments for ACUTE CARE  Such as: Sprains, Injuries, cuts, abrasions, rashes, muscle pain, joint pain, back pain Colds, flu, sore throats, headache, allergies, cough, fever  Ear pain, sinus and eye infections Abdominal pain, nausea, vomiting, diarrhea, upset stomach Animal/insect bites  3 Easy Ways to Schedule: Walk-In Scheduling Call in scheduling Mychart Sign-up: [email or web address]         

## 2017-01-28 NOTE — Progress Notes (Signed)
   Subjective:    Patient ID: Kelly Frazier, female    DOB: 1946-01-24, 71 y.o.   MRN: [id number]  HPI Here for one week of fever, stuffy head, PND, ST, and coughing up yellow sputum. Using Advil.    Review of Systems  Constitutional: Positive for fever.  HENT: Positive for congestion, postnasal drip, sinus pain, sinus pressure and sore throat.   Eyes: Negative.   Respiratory: Positive for cough.        Objective:   Physical Exam  Constitutional: She appears well-developed and well-nourished.  HENT:  Right Ear: External ear normal.  Left Ear: External ear normal.  Nose: Nose normal.  Mouth/Throat: Oropharynx is clear and moist.  Eyes: Conjunctivae are normal.  Neck: No thyromegaly present.  Pulmonary/Chest: Effort normal and breath sounds normal. No respiratory distress. She has no wheezes. She has no rales.  Lymphadenopathy:    She has no cervical adenopathy.          Assessment & Plan:  Sinusitis, treat with a Zpack. Alysia Penna, MD

## 2017-02-23 ENCOUNTER — Ambulatory Visit: Payer: Medicare Other | Attending: Family Medicine

## 2017-02-23 DIAGNOSIS — M5412 Radiculopathy, cervical region: Secondary | ICD-10-CM | POA: Diagnosis not present

## 2017-02-23 DIAGNOSIS — M6281 Muscle weakness (generalized): Secondary | ICD-10-CM | POA: Diagnosis not present

## 2017-02-23 DIAGNOSIS — R293 Abnormal posture: Secondary | ICD-10-CM

## 2017-02-23 DIAGNOSIS — M542 Cervicalgia: Secondary | ICD-10-CM | POA: Diagnosis not present

## 2017-02-23 NOTE — Patient Instructions (Signed)

## 2017-02-23 NOTE — Therapy (Signed)
[facility] [address]  [place], [place], [address] Phone: [phone]   Fax:  [phone]  Physical Therapy Treatment  Patient Details  Name: Kelly Frazier MRN: [id number] Date of Birth: March 12, 1946 Referring Provider: Garret Reddish, MD  Encounter Date: 02/23/2017      PT End of Session - 02/23/17 1539    Visit Number 6   Number of Visits 16   Date for PT Re-Evaluation 03/13/17   Authorization Type Medicare & [employer]   PT Start Time 1532   PT Stop Time 1631   PT Time Calculation (min) 59 min   Activity Tolerance Patient tolerated treatment well   Behavior During Therapy [facility] for tasks assessed/performed      Past Medical History:  Diagnosis Date  . Arthritis   . Diverticulosis   . Excessive daytime sleepiness 06/05/2016  . Headache(784.0)   . Hx of colonic polyps   . Hyperlipidemia   . Hypertension   . Thyroid disease     Past Surgical History:  Procedure Laterality Date  . AXILLARY ABCESS IRRIGATION AND DEBRIDEMENT     cyst  . bcca from eyelid     basal cell 1980s.   . COLONOSCOPY  08/17/06    There were no vitals filed for this visit.      Subjective Assessment - 02/23/17 1535    Subjective Pt. noting she was not able to perform HEP over break as much as she wanted to.     Patient Stated Goals "pain-free & ability to resume normal tasks"   Currently in Pain? Yes   Pain Score 3    Pain Location Neck   Pain Orientation Left   Pain Type Acute pain   Pain Radiating Towards Down L neck into R shoulder    Pain Onset More than a month ago   Pain Frequency Intermittent   Multiple Pain Sites No                         [facility] Adult PT Treatment/Exercise - 02/23/17 1547      Self-Care   Self-Care Other Self-Care Comments;Posture   Other Self-Care Comments  Discussion of current HEP to check for tolerance; Discussion/demo of proper body mechanics with sweeping, and  pulling weeds     Neck Exercises: Machines for Strengthening   UBE (Upper Arm Bike) lvl 2.5 fwd/back x 3' each     Neck Exercises: Theraband   Shoulder Extension 15 reps;Red   Shoulder Extension Limitations + B scap retraction in standing   Rows 15 reps;Green   Rows Limitations + B scap retraction in standing   Shoulder External Rotation 15 reps;Red   Shoulder External Rotation Limitations + B scap retraction leaning on 1/2 foam roll    Horizontal ABduction 15 reps;Red   Horizontal ABduction Limitations + B scap retraction in standing leaning on 1/2 foam bolster      Moist Heat Therapy   Number Minutes Moist Heat 15 Minutes   Moist Heat Location Cervical;Shoulder     Electrical Stimulation   Electrical Stimulation Location B UT & cervical paraspinals    Electrical Stimulation Action IFC    Electrical Stimulation Parameters to tolerance   Electrical Stimulation Goals Pain;Tone     Neck Exercises: Stretches   Upper Trapezius Stretch 2 reps;30 seconds   Upper Trapezius Stretch Limitations bilateral; manual   Levator Stretch 30 seconds   Levator Stretch Limitations  arm behind back; bilaterally   Corner Stretch 30 seconds;3 reps   Corner Stretch Limitations 3 way doorway stretch                PT Education - 02/23/17 1737    Education provided Yes   Education Details Discussion of posture and body mechanics handout (pt. already has)    Person(s) Educated Patient   Methods Explanation;Demonstration;Verbal cues   Comprehension Verbalized understanding;Verbal cues required;Need further instruction          PT Short Term Goals - 02/23/17 1738      PT SHORT TERM GOAL #1   Title Independent with initial HEP by 01/30/17   Status Achieved     PT SHORT TERM GOAL #2   Title Pt will verbalize understanding of neutral spine and shoulder posture by 01/30/17   Status Achieved           PT Long Term Goals - 01/16/17 1027      PT LONG TERM GOAL #1   Title Independent  with advanced HEP as indicated by 03/13/17   Status On-going     PT LONG TERM GOAL #2   Title Pt will routinely demostrate neutral spine and shoulder posture at least 75% of the time by 03/13/17   Status On-going     PT LONG TERM GOAL #3   Title Cervical ROM WFL w/o pain to allow pt to safely check blind spot while driving by 0/16/01   Status On-going     PT LONG TERM GOAL #4   Title B shoulder strength >/= 4+/5 by 03/13/17   Status On-going     PT LONG TERM GOAL #5   Title Pt will report ability to perform normal daily tasks/chores w/o limitation due to neck pain by 03/13/17   Status On-going               Plan - 02/23/17 1606    Clinical Impression Statement Pt. returning to therapy after three and a half week break following travelling.  Pt. noting some increased neck pain today admitting to inconsistent adherence to HEP over break.  Some cueing required today with review of original HEP and heavy cueing required for band resisted latest HEP activities.  Pt. noting she is still having most trouble with pulling weeds and household chores thus review of proper body mechanics with these tasks to end treatment.  Some low-level neck/shoulder pain following therex thus E-stim/moist heat to end treatment.     PT Treatment/Interventions Patient/family education;Neuromuscular re-education;Therapeutic exercise;Manual techniques;Passive range of motion;Taping;Dry needling;Therapeutic activities;Electrical Stimulation;Moist Heat;Cryotherapy;Traction;ADLs/Self Care Home Management   PT Next Visit Plan Manual therapy to address increased muscle tension and pain; postural strengthening; modalities PRN for pain; consider traction if radicular symptoms persist      Patient will benefit from skilled therapeutic intervention in order to improve the following deficits and impairments:  Pain, Postural dysfunction, Improper body mechanics, Impaired flexibility, Increased muscle spasms, Decreased range of  motion, Decreased strength, Impaired UE functional use  Visit Diagnosis: Cervicalgia  Radiculopathy, cervical region  Abnormal posture  Muscle weakness (generalized)     Problem List Patient Active Problem List   Diagnosis Date Noted  . Solitary pulmonary nodule 01/10/2017  . Snoring 06/05/2016  . Excessive daytime sleepiness 06/05/2016  . Shortness of breath 03/11/2016  . Migraine 08/17/2014  . Esophageal reflux 09/03/2010  . Angioedema 06/21/2010  . History of colonic polyps 09/27/2007  . Hyperlipidemia 05/03/2007  . Essential hypertension 05/03/2007  .  Hypothyroidism 04/12/2007  . Osteoarthritis 04/12/2007    Bess Harvest, PTA 02/23/17 5:47 PM  [facility] [address]  [place], [place], [address] Phone: [phone]   Fax:  [phone]  Name: Kelly Frazier MRN: [id number] Date of Birth: 1946/03/27

## 2017-02-25 ENCOUNTER — Ambulatory Visit: Payer: Medicare Other | Admitting: Physical Therapy

## 2017-02-25 DIAGNOSIS — M542 Cervicalgia: Secondary | ICD-10-CM | POA: Diagnosis not present

## 2017-02-25 DIAGNOSIS — M5412 Radiculopathy, cervical region: Secondary | ICD-10-CM | POA: Diagnosis not present

## 2017-02-25 DIAGNOSIS — R293 Abnormal posture: Secondary | ICD-10-CM | POA: Diagnosis not present

## 2017-02-25 DIAGNOSIS — M6281 Muscle weakness (generalized): Secondary | ICD-10-CM
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
## 2017-02-25 NOTE — Patient Instructions (Signed)
TENS stands for Transcutaneous Electrical Nerve Stimulation. In other words, electrical impulses are allowed to pass through the skin in order to excite a nerve.   Purpose and Use of TENS:  TENS is a method used to manage acute and chronic pain without the use of drugs. It has been effective in managing pain associated with surgery, sprains, strains, trauma, rheumatoid arthritis, and neuralgias. It is a non-addictive, low risk, and non-invasive technique used to control pain. It is not, by any means, a curative form of treatment.   How TENS Works:  Most TENS units are a small pocket-sized unit powered by one 9 volt battery. Attached to the outside of the unit are two lead wires where two pins and/or snaps connect on each wire. All units come with a set of four reusable pads or electrodes. These are placed on the skin surrounding the area involved. By inserting the leads into  the pads, the electricity can pass from the unit making the circuit complete.  As the intensity is turned up slowly, the electrical current enters the body from the electrodes through the skin to the surrounding nerve fibers. This triggers the release of hormones from within the body. These hormones contain pain relievers. By increasing the circulation of these hormones, the person's pain may be lessened. It is also believed that the electrical stimulation itself helps to block the pain messages being sent to the brain, thus also decreasing the body's perception of pain.   Hazards:  TENS units are NOT to be used by patients with PACEMAKERS, DEFIBRILLATORS, DIABETIC PUMPS, PREGNANT WOMEN, and patients with SEIZURE DISORDERS.  TENS units are NOT to be used over the heart, throat, brain, or spinal cord.  One of the major side effects from the TENS unit may be skin irritation. Some people may develop a rash if they are sensitive to the materials used in the electrodes or the connecting wires.   Wear the unit for up to 30 minutes at a  time.   Avoid overuse due the body getting used to the stem making it not as effective over time.    

## 2017-03-26 ENCOUNTER — Other Ambulatory Visit: Payer: Self-pay | Admitting: Family Medicine

## 2017-04-27 ENCOUNTER — Encounter: Payer: Self-pay | Admitting: Gastroenterology

## 2017-06-04 DIAGNOSIS — Z85828 Personal history of other malignant neoplasm of skin: Secondary | ICD-10-CM | POA: Diagnosis not present

## 2017-06-04 DIAGNOSIS — D485 Neoplasm of uncertain behavior of skin: Secondary | ICD-10-CM | POA: Diagnosis not present

## 2017-06-04 DIAGNOSIS — L821 Other seborrheic keratosis: Secondary | ICD-10-CM | POA: Diagnosis not present

## 2017-06-04 DIAGNOSIS — Z08 Encounter for follow-up examination after completed treatment for malignant neoplasm: Secondary | ICD-10-CM | POA: Diagnosis not present

## 2017-06-04 DIAGNOSIS — L820 Inflamed seborrheic keratosis: Secondary | ICD-10-CM | POA: Diagnosis not present

## 2017-06-04 DIAGNOSIS — D225 Melanocytic nevi of trunk: Secondary | ICD-10-CM | POA: Diagnosis not present

## 2017-06-11 ENCOUNTER — Ambulatory Visit (INDEPENDENT_AMBULATORY_CARE_PROVIDER_SITE_OTHER): Payer: Medicare Other

## 2017-06-11 DIAGNOSIS — Z23 Encounter for immunization: Secondary | ICD-10-CM | POA: Diagnosis not present

## 2017-06-15 ENCOUNTER — Ambulatory Visit (AMBULATORY_SURGERY_CENTER): Payer: Self-pay | Admitting: *Deleted

## 2017-06-15 VITALS — Ht 64.0 in | Wt 184.0 lb

## 2017-06-15 DIAGNOSIS — Z1211 Encounter for screening for malignant neoplasm of colon: Secondary | ICD-10-CM

## 2017-06-15 MED ORDER — NA SULFATE-K SULFATE-MG SULF 17.5-3.13-1.6 GM/177ML PO SOLN: 1.0000 | Freq: Once | ORAL | 0 refills | Status: AC

## 2017-06-15 NOTE — Progress Notes (Signed)
No egg or soy allergy known to patient  No issues with past sedation with any surgeries  or procedures, no intubation problems  No diet pills per patient No home 02 use per patient  No blood thinners per patient  Pt denies issues with constipation  No A fib or A flutter  EMMI video sent to pt's e mail pt declined   

## 2017-06-19 ENCOUNTER — Telehealth: Payer: Self-pay | Admitting: Gastroenterology

## 2017-06-19 NOTE — Telephone Encounter (Signed)
Returned patient's call and she states that her insurance did not cover the prep.  I advised her that I would call the pharmacy and give no more than $50 coupon over the phone and see if it will go through.  I called [facility] and gave the information over the phone and it went through.  Patient will owe $50 for her prep.  I called patient back and informed her of this.  She was very Patent attorney.  No more than $50 Suprep Coupon  BIN  [id number]  PCN  [id number]  Group   [id number]  ID   [id number]

## 2017-06-29 ENCOUNTER — Encounter: Payer: Self-pay | Admitting: Gastroenterology

## 2017-06-29 ENCOUNTER — Ambulatory Visit (AMBULATORY_SURGERY_CENTER): Payer: Medicare Other | Admitting: Gastroenterology

## 2017-06-29 VITALS — BP 122/73 | HR 55 | Temp 97.8°F | Resp 10 | Ht 64.0 in | Wt 184.0 lb

## 2017-06-29 DIAGNOSIS — K635 Polyp of colon: Secondary | ICD-10-CM

## 2017-06-29 DIAGNOSIS — D126 Benign neoplasm of colon, unspecified: Secondary | ICD-10-CM

## 2017-06-29 DIAGNOSIS — D128 Benign neoplasm of rectum: Secondary | ICD-10-CM | POA: Diagnosis not present

## 2017-06-29 DIAGNOSIS — D123 Benign neoplasm of transverse colon: Secondary | ICD-10-CM

## 2017-06-29 DIAGNOSIS — Z1212 Encounter for screening for malignant neoplasm of rectum: Secondary | ICD-10-CM | POA: Diagnosis not present

## 2017-06-29 DIAGNOSIS — K219 Gastro-esophageal reflux disease without esophagitis: Secondary | ICD-10-CM | POA: Diagnosis not present

## 2017-06-29 DIAGNOSIS — I10 Essential (primary) hypertension: Secondary | ICD-10-CM | POA: Diagnosis not present

## 2017-06-29 DIAGNOSIS — D120 Benign neoplasm of cecum: Secondary | ICD-10-CM

## 2017-06-29 DIAGNOSIS — Z1211 Encounter for screening for malignant neoplasm of colon: Secondary | ICD-10-CM | POA: Diagnosis present

## 2017-06-29 DIAGNOSIS — Z86010 Personal history of colonic polyps: Secondary | ICD-10-CM | POA: Diagnosis not present

## 2017-06-29 DIAGNOSIS — D129 Benign neoplasm of anus and anal canal: Secondary | ICD-10-CM

## 2017-06-29 MED ORDER — SODIUM CHLORIDE 0.9 % IV SOLN: 500.0000 mL | INTRAVENOUS | Status: DC

## 2017-06-29 NOTE — Op Note (Signed)
[facility] Patient Name: Kelly Frazier Procedure Date: 06/29/2017 7:57 AM MRN: [id number] Endoscopist: Ladene Artist , MD Age: 71 Referring MD:  Date of Birth: 27-Jun-1946 Gender: Female Account #: [id number] Procedure:                Colonoscopy Indications:              Screening for colorectal malignant neoplasm Medicines:                Monitored Anesthesia Care Procedure:                Pre-Anesthesia Assessment:                           - Prior to the procedure, a History and Physical                            was performed, and patient medications and                            allergies were reviewed. The patient's tolerance of                            previous anesthesia was also reviewed. The risks                            and benefits of the procedure and the sedation                            options and risks were discussed with the patient.                            All questions were answered, and informed consent                            was obtained. Prior Anticoagulants: The patient has                            taken no previous anticoagulant or antiplatelet                            agents. ASA Grade Assessment: II - A patient with                            mild systemic disease. After reviewing the risks                            and benefits, the patient was deemed in                            satisfactory condition to undergo the procedure.                           After obtaining informed consent, the colonoscope  was passed under direct vision. Throughout the                            procedure, the patient's blood pressure, pulse, and                            oxygen saturations were monitored continuously. The                            Colonoscope was introduced through the anus and                            advanced to the the cecum, identified by                            appendiceal orifice and  ileocecal valve. The                            ileocecal valve, appendiceal orifice, and rectum                            were photographed. The quality of the bowel                            preparation was excellent. The colonoscopy was                            performed without difficulty. The patient tolerated                            the procedure well. Scope In: 8:08:56 AM Scope Out: 8:24:53 AM Scope Withdrawal Time: 0 hours 13 minutes 1 second  Total Procedure Duration: 0 hours 15 minutes 57 seconds  Findings:                 The perianal and digital rectal examinations were                            normal.                           Four sessile polyps were found in the rectum,                            transverse colon and cecum. The polyps were 4 to 6                            mm in size. These polyps were removed with a cold                            snare. Resection and retrieval were complete.                           Many medium-mouthed diverticula were found in the  left colon. There was no evidence of diverticular                            bleeding.                           Internal hemorrhoids were found during                            retroflexion. The hemorrhoids were small and Grade                            I (internal hemorrhoids that do not prolapse).                           The exam was otherwise without abnormality on                            direct and retroflexion views. Complications:            No immediate complications. Estimated blood loss:                            None. Estimated Blood Loss:     Estimated blood loss: none. Impression:               - Four 4 to 6 mm polyps in the rectum, in the                            transverse colon and in the cecum, removed with a                            cold snare. Resected and retrieved.                           - Moderate diverticulosis in the left colon. There                             was no evidence of diverticular bleeding.                           - Internal hemorrhoids.                           - The examination was otherwise normal on direct                            and retroflexion views. Recommendation:           - Repeat colonoscopy in 5 years for surveillance if                            polyp(s) are precancerous, otherwise negative.                           - Patient has a contact number available for  emergencies. The signs and symptoms of potential                            delayed complications were discussed with the                            patient. Return to normal activities tomorrow.                            Written discharge instructions were provided to the                            patient.                           - Resume previous diet.                           - Continue present medications.                           - Await pathology results. Ladene Artist, MD 06/29/2017 8:29:05 AM This report has been signed electronically.

## 2017-06-29 NOTE — Progress Notes (Signed)
Called to room to assist during endoscopic procedure.  Patient ID and intended procedure confirmed with present staff. Received instructions for my participation in the procedure from the performing physician.  

## 2017-06-29 NOTE — Patient Instructions (Signed)
Discharge instructions given. Handouts on polyps,diverticulosis and hemorrhoids. Resume previous medications. YOU HAD AN ENDOSCOPIC PROCEDURE TODAY AT THE [facility]:   Refer to the procedure report that was given to you for any specific questions about what was found during the examination.  If the procedure report does not answer your questions, please call your gastroenterologist to clarify.  If you requested that your care partner not be given the details of your procedure findings, then the procedure report has been included in a sealed envelope for you to review at your convenience later.  YOU SHOULD EXPECT: Some feelings of bloating in the abdomen. Passage of more gas than usual.  Walking can help get rid of the air that was put into your GI tract during the procedure and reduce the bloating. If you had a lower endoscopy (such as a colonoscopy or flexible sigmoidoscopy) you may notice spotting of blood in your stool or on the toilet paper. If you underwent a bowel prep for your procedure, you may not have a normal bowel movement for a few days.  Please Note:  You might notice some irritation and congestion in your nose or some drainage.  This is from the oxygen used during your procedure.  There is no need for concern and it should clear up in a day or so.  SYMPTOMS TO REPORT IMMEDIATELY:   Following lower endoscopy (colonoscopy or flexible sigmoidoscopy):  Excessive amounts of blood in the stool  Significant tenderness or worsening of abdominal pains  Swelling of the abdomen that is new, acute  Fever of 100F or higher   For urgent or emergent issues, a gastroenterologist can be reached at any hour by calling [phone].   DIET:  We do recommend a small meal at first, but then you may proceed to your regular diet.  Drink plenty of fluids but you should avoid alcoholic beverages for 24 hours.  ACTIVITY:  You should plan to take it easy for the rest of today and you  should NOT DRIVE or use heavy machinery until tomorrow (because of the sedation medicines used during the test).    FOLLOW UP: Our staff will call the number listed on your records the next business day following your procedure to check on you and address any questions or concerns that you may have regarding the information given to you following your procedure. If we do not reach you, we will leave a message.  However, if you are feeling well and you are not experiencing any problems, there is no need to return our call.  We will assume that you have returned to your regular daily activities without incident.  If any biopsies were taken you will be contacted by phone or by letter within the next 1-3 weeks.  Please call us at [phone] if you have not heard about the biopsies in 3 weeks.    SIGNATURES/CONFIDENTIALITY: You and/or your care partner have signed paperwork which will be entered into your electronic medical record.  These signatures attest to the fact that that the information above on your After Visit Summary has been reviewed and is understood.  Full responsibility of the confidentiality of this discharge information lies with you and/or your care-partner. 

## 2017-06-29 NOTE — Progress Notes (Signed)
Report given to PACU, vss 

## 2017-06-30 ENCOUNTER — Encounter: Payer: Self-pay | Admitting: *Deleted

## 2017-06-30 ENCOUNTER — Telehealth: Payer: Self-pay | Admitting: *Deleted

## 2017-06-30 NOTE — Telephone Encounter (Signed)
Erroneous encounter

## 2017-06-30 NOTE — Telephone Encounter (Signed)
  Follow up Call-  Call back number 06/29/2017  Post procedure Call Back phone  # [phone]  Permission to leave phone message Yes  Some recent data might be hidden     Patient questions:  Do you have a fever, pain , or abdominal swelling? No. Pain Score  0 *  Have you tolerated food without any problems? Yes.    Have you been able to return to your normal activities? Yes.    Do you have any questions about your discharge instructions: Diet   No. Medications  No. Follow up visit  No.  Do you have questions or concerns about your Care? No.  Actions: * If pain score is 4 or above: No action needed, pain <4.

## 2017-07-10 ENCOUNTER — Encounter: Payer: Self-pay | Admitting: Gastroenterology

## 2017-08-03 ENCOUNTER — Encounter: Payer: Self-pay | Admitting: Family Medicine

## 2017-08-03 ENCOUNTER — Ambulatory Visit (INDEPENDENT_AMBULATORY_CARE_PROVIDER_SITE_OTHER): Payer: Medicare Other | Admitting: Family Medicine

## 2017-08-03 VITALS — BP 120/80 | HR 73 | Temp 98.0°F | Ht 64.0 in | Wt 186.5 lb

## 2017-08-03 DIAGNOSIS — E034 Atrophy of thyroid (acquired): Secondary | ICD-10-CM | POA: Diagnosis not present

## 2017-08-03 DIAGNOSIS — M6530 Trigger finger, unspecified finger: Secondary | ICD-10-CM

## 2017-08-03 MED ORDER — PREDNISONE 20 MG PO TABS: ORAL_TABLET | ORAL | 0 refills | Status: DC

## 2017-08-03 NOTE — Assessment & Plan Note (Addendum)
S: Patient has noted this fall that she has started to have more pain in her hands. She is left handed- over last few weeks has noted 3rd and 4th finger on left hand getting "stuck" - seems to be better as day goes on. Denies any significant discomfort. Does have some aching at [facility] joints of both hands that predates this. Also has similar clicking/stuck sensation on right 3rd finger over last week or so. Has not taken medicine due to not having significant pain and fact improves as day goes on.   No nsaids as in the past had swelling- which she reports was in her face and tongue on naproxen.  A/P: 2 issues here- mild CMC arthritis likely bilateral in addition to trigger finger x2 on left and x1 on rightFrom AVS"This is trigger finger. Splinting would help but would be hard with so many fingers involved. Trial antiinflammatory for 7 days- low dose prednisone. Can start next Monday or want or sooner but can increase appetite and promote weight gain which may be tough over thanksgiving. Please follow up with me- happy to refer you to Dr. Paulla Fore of sports medicine as he can do injections if needed or offer other therapeutic options "

## 2017-08-03 NOTE — Progress Notes (Signed)
Subjective:  Kelly Frazier is a 71 y.o. year old very pleasant female patient who presents for/with See problem oriented charting ROS- no fever, chills, redness on hands, grip strength weakness.    Past Medical History-  Patient Active Problem List   Diagnosis Date Noted  . Solitary pulmonary nodule 01/10/2017    Priority: Medium  . Migraine 08/17/2014    Priority: Medium  . Hyperlipidemia 05/03/2007    Priority: Medium  . Essential hypertension 05/03/2007    Priority: Medium  . Hypothyroidism 04/12/2007    Priority: Medium  . Esophageal reflux 09/03/2010    Priority: Low  . Angioedema 06/21/2010    Priority: Low  . History of colonic polyps 09/27/2007    Priority: Low  . Osteoarthritis 04/12/2007    Priority: Low  . Trigger finger, left 08/03/2017  . Snoring 06/05/2016  . Excessive daytime sleepiness 06/05/2016  . Shortness of breath 03/11/2016    Medications- reviewed and updated Current Outpatient Medications  Medication Sig Dispense Refill  . bisoprolol-hydrochlorothiazide (ZIAC) 2.5-6.25 MG tablet TAKE 1 TABLET EVERY DAY 90 tablet 3  . EPINEPHrine (EPIPEN JR) 0.15 MG/0.3ML injection Inject 0.15 mg into the muscle as needed.      Marland Kitchen ibuprofen (ADVIL,MOTRIN) 200 MG tablet Take 200 mg by mouth every 6 (six) hours as needed for headache, mild pain or cramping.     Marland Kitchen levothyroxine (SYNTHROID, LEVOTHROID) 75 MCG tablet TAKE 1 TABLET EVERY DAY 90 tablet 3  . omeprazole (PRILOSEC) 20 MG capsule TAKE 1 CAPSULE EVERY DAY 90 capsule 3     Objective: BP 120/80 (BP Location: Left Arm, Patient Position: Sitting, Cuff Size: Large)   Pulse 73   Temp 98 F (36.7 C) (Oral)   Ht 5\' 4"  (1.626 m)   Wt 186 lb 8 oz (84.6 kg)   SpO2 97%   BMI 32.01 kg/m  Gen: NAD, resting comfortably, well appearing CV: RRR  Lungs: no wheeze, nonlabored Abdomen: soft/nontender Ext: no edema Skin: warm, dry MSK: good grip strength both hands, on left hand small nodule felt along a1 pulley  system on left hand 3rd finger- some  Pain with palpation of this as it slides in groove. Unable to reproduce trigger. No nodule felt on 4th finger or 3rd finger on right hand. Does have tenderness in bilateral CMC joints with palpation.   Assessment/Plan:  Trigger finger, left S: Patient has noted this fall that she has started to have more pain in her hands. She is left handed- over last few weeks has noted 3rd and 4th finger on left hand getting "stuck" - seems to be better as day goes on. Denies any significant discomfort. Does have some aching at [facility] joints of both hands that predates this. Also has similar clicking/stuck sensation on right 3rd finger over last week or so. Has not taken medicine due to not having significant pain and fact improves as day goes on.   No nsaids as in the past had swelling- which she reports was in her face and tongue on naproxen.  A/P: 2 issues here- mild CMC arthritis likely bilateral in addition to trigger finger x2 on left and x1 on rightFrom AVS"This is trigger finger. Splinting would help but would be hard with so many fingers involved. Trial antiinflammatory for 7 days- low dose prednisone. Can start next Monday or want or sooner but can increase appetite and promote weight gain which may be tough over thanksgiving. Please follow up with me- happy to refer you to  Dr. Paulla Fore of sports medicine as he can do injections if needed or offer other therapeutic options "  Future Appointments  Date Time Provider [place]  08/18/2017  1:00 PM Williemae Area, RN [facility] None  08/18/2017  2:15 PM Yong Channel Brayton Mars, MD [facility] None  advised AWV follow up as well as visit wiith me before end of year- needs TSH update and would be good chance to follow up on fingers.  Orders Placed This Encounter  Procedures  . TSH    Standing Status:   Future    Standing Expiration Date:   08/03/2018   Meds ordered this encounter  Medications  . predniSONE (DELTASONE)  20 MG tablet    Sig: Take 1 tablet by mouth daily for 5 days, then 1/2 tablet daily for 2 days    Dispense:  6 tablet    Refill:  0  trigger finger- new acute issue with medication management  Return precautions advised.  Garret Reddish, MD

## 2017-08-03 NOTE — Patient Instructions (Signed)
This is trigger finger. Splinting would help but would be hard with so many fingers involved. Trial antiinflammatory for 7 days- low dose prednisone. Can start next Monday or want or sooner but can increase appetite and promote weight gain which may be tough over thanksgiving. Please follow up with me- happy to refer you to Dr. Paulla Fore of sports medicine as he can do injections if needed or offer other therapeutic options

## 2017-08-05 ENCOUNTER — Telehealth: Payer: Self-pay | Admitting: Family Medicine

## 2017-08-05 NOTE — Telephone Encounter (Signed)
Prednisone is an inexpensive medication. What is her cost? Can they run it without her insurance?

## 2017-08-05 NOTE — Telephone Encounter (Signed)
Cost is $92 at [place]. I advised patient to call and ask if they had run that through her insurance. She verbalized understanding and she will call back on Monday to let me know.

## 2017-08-05 NOTE — Telephone Encounter (Signed)
Patient says prednisone too expensive. Please call in generic or something similar.

## 2017-08-17 ENCOUNTER — Other Ambulatory Visit: Payer: Self-pay | Admitting: Family Medicine

## 2017-08-17 NOTE — Progress Notes (Signed)
PCP notes:   Health maintenance: Dexa - patient would like to discuss this with Dr Yong Channel   Abnormal screenings: none.   Patient concerns: None.   Nurse concerns: None.    Next PCP appt: 08/18/17 2:15

## 2017-08-17 NOTE — Progress Notes (Signed)
Pre visit review using our clinic review tool, if applicable. No additional management support is needed unless otherwise documented below in the visit note. 
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
## 2017-08-18 ENCOUNTER — Ambulatory Visit (INDEPENDENT_AMBULATORY_CARE_PROVIDER_SITE_OTHER): Payer: Medicare Other | Admitting: *Deleted

## 2017-08-18 ENCOUNTER — Encounter: Payer: Self-pay | Admitting: Family Medicine

## 2017-08-18 ENCOUNTER — Encounter: Payer: Self-pay | Admitting: *Deleted

## 2017-08-18 ENCOUNTER — Ambulatory Visit (INDEPENDENT_AMBULATORY_CARE_PROVIDER_SITE_OTHER): Payer: Medicare Other | Admitting: Family Medicine

## 2017-08-18 VITALS — BP 142/88 | HR 61 | Temp 98.4°F | Resp 16 | Ht 64.0 in | Wt 185.2 lb

## 2017-08-18 VITALS — BP 130/78 | HR 61 | Temp 98.4°F | Ht 64.0 in | Wt 185.2 lb

## 2017-08-18 DIAGNOSIS — Z1331 Encounter for screening for depression: Secondary | ICD-10-CM | POA: Diagnosis not present

## 2017-08-18 DIAGNOSIS — Z0000 Encounter for general adult medical examination without abnormal findings: Secondary | ICD-10-CM | POA: Diagnosis not present

## 2017-08-18 DIAGNOSIS — I10 Essential (primary) hypertension: Secondary | ICD-10-CM | POA: Diagnosis not present

## 2017-08-18 DIAGNOSIS — M6530 Trigger finger, unspecified finger: Secondary | ICD-10-CM

## 2017-08-18 DIAGNOSIS — K219 Gastro-esophageal reflux disease without esophagitis: Secondary | ICD-10-CM

## 2017-08-18 DIAGNOSIS — E785 Hyperlipidemia, unspecified: Secondary | ICD-10-CM

## 2017-08-18 DIAGNOSIS — E034 Atrophy of thyroid (acquired): Secondary | ICD-10-CM | POA: Diagnosis not present

## 2017-08-18 MED ORDER — OMEPRAZOLE 20 MG PO CPDR: 20.0000 mg | DELAYED_RELEASE_CAPSULE | Freq: Every day | ORAL | 3 refills | Status: DC

## 2017-08-18 NOTE — Progress Notes (Signed)
I have reviewed and agree with note, evaluation, plan. Patient opts out of Dexa- normal bone density in her 15s.   Garret Reddish, MD

## 2017-08-18 NOTE — Patient Instructions (Addendum)
For fingers-Improved overall. Wants to monitor for now. Will call me if worsens and needs to see Dr. Paulla Fore of sports medicine  Please trial zantac 150mg  (or ranitidine generic) twice a day for a week or so to see if that controls reflux- if not return to your omeprazole  Please stop by lab before you go

## 2017-08-18 NOTE — Progress Notes (Addendum)
Subjective:  Kelly Frazier is a 71 y.o. year old very pleasant female patient who presents for/with See problem oriented charting ROS- No chest pain or shortness of breath. No blurry vision. Still with finger pain ut much improved. No edema.    Past Medical History-  Patient Active Problem List   Diagnosis Date Noted  . Solitary pulmonary nodule 01/10/2017    Priority: Medium  . Migraine 08/17/2014    Priority: Medium  . Hyperlipidemia 05/03/2007    Priority: Medium  . Essential hypertension 05/03/2007    Priority: Medium  . Hypothyroidism 04/12/2007    Priority: Medium  . Esophageal reflux 09/03/2010    Priority: Low  . Angioedema 06/21/2010    Priority: Low  . History of colonic polyps 09/27/2007    Priority: Low  . Osteoarthritis 04/12/2007    Priority: Low  . Trigger finger, left 08/03/2017  . Snoring 06/05/2016  . Excessive daytime sleepiness 06/05/2016  . Shortness of breath 03/11/2016    Medications- reviewed and updated Current Outpatient Medications  Medication Sig Dispense Refill  . bisoprolol-hydrochlorothiazide (ZIAC) 2.5-6.25 MG tablet TAKE 1 TABLET EVERY DAY 90 tablet 3  . EPINEPHrine (EPIPEN JR) 0.15 MG/0.3ML injection Inject 0.15 mg into the muscle as needed.      Marland Kitchen ibuprofen (ADVIL,MOTRIN) 200 MG tablet Take 200 mg by mouth every 6 (six) hours as needed for headache, mild pain or cramping.     Marland Kitchen levothyroxine (SYNTHROID, LEVOTHROID) 75 MCG tablet TAKE 1 TABLET EVERY DAY 90 tablet 3  . omeprazole (PRILOSEC) 20 MG capsule Take 1 capsule (20 mg total) by mouth daily. 90 capsule 3   No current facility-administered medications for this visit.     Objective: BP 130/78   Pulse 61   Temp 98.4 F (36.9 C) (Oral)   Ht 5\' 4"  (1.626 m)   Wt 185 lb 3.2 oz (84 kg)   SpO2 96%   BMI 31.79 kg/m  Gen: NAD, resting comfortably CV: RRR no murmurs rubs or gallops Lungs: CTAB no crackles, wheeze, rhonchi Abdomen: soft/nontender/nondistended/normal bowel sounds.  obese Ext: no edema Skin: warm, dry  Assessment/Plan:  Other notes 1. declines bone density with normal 2012 2. Medicare advantage plan switching to this.   Hypertension S: controlled on ziac.  BP Readings from Last 3 Encounters:  08/18/17 130/78  08/18/17 (!) 142/88  08/03/17 120/80  A/P: We discussed blood pressure goal of <140/90. Continue current meds  Esophageal reflux S: on omeprazole- when misses doses gets breakthrough.  A/P: advised zantac trial instead of just hard stop on PPI. She will let me know how this goes at next visit  Hyperlipidemia S: likely poorly controlled as she stopped her atorvastatin 10mg  (had been taking half tab years ago). No myalgias.  Lab Results  Component Value Date   CHOL 159 08/17/2014   HDL 30.10 (L) 08/17/2014   LDLCALC 110 (H) 08/17/2014   LDLDIRECT 97.0 06/28/2015   TRIG 96.0 08/17/2014   CHOLHDL 5 08/17/2014   A/P: update lipids and calculate 10 year risk. Discussed if over 10% likely advise statin  Trigger finger, left S:  last visit we tried low dose prednisone for several trigger fingers- did not think she could tolerate splinting so many important fingers (3rd and fourth finger on left hand but also right 3rd finger)  Also some pain at [facility] joints thought to be arthritis A/P:Improved overall. Wants to monitor for now. Will call me if worsens and needs to see Dr. Paulla Fore of sports  medicine  Future Appointments  Date Time Provider [place]  08/19/2018  1:00 PM Williemae Area, RN [facility]   6 month would be reasonable- did not discuss  Orders Placed This Encounter  Procedures  . CBC    [place]  . Comprehensive metabolic panel    [place]    Order Specific Question:   Has the patient fasted?    Answer:   No  . TSH    [place]  . Lipid panel    [place]    Order Specific Question:   Has the patient fasted?    Answer:   No   Meds ordered this encounter  Medications  . omeprazole (PRILOSEC) 20 MG capsule     Sig: Take 1 capsule (20 mg total) by mouth daily.    Dispense:  90 capsule    Refill:  3   Return precautions advised.  Garret Reddish, MD

## 2017-08-18 NOTE — Assessment & Plan Note (Signed)
S:  last visit we tried low dose prednisone for several trigger fingers- did not think she could tolerate splinting so many important fingers (3rd and fourth finger on left hand but also right 3rd finger)  Also some pain at [facility] joints thought to be arthritis A/P:Improved overall. Wants to monitor for now. Will call me if worsens and needs to see Dr. Paulla Fore of sports medicine

## 2017-08-18 NOTE — Patient Instructions (Signed)
Ms. Shoff , Thank you for taking time to come for your Medicare Wellness Visit. I appreciate your ongoing commitment to your health goals. Please review the following plan we discussed and let me know if I can assist you in the future.   These are the goals we discussed: Goals    None      This is a list of the screening recommended for you and due dates:  Health Maintenance  Topic Date Due  . Mammogram  02/20/2018  . Colon Cancer Screening  06/29/2022  . Tetanus Vaccine  11/23/2024  . Flu Shot  Completed  . DEXA scan (bone density measurement)  Completed  .  Hepatitis C: One time screening is recommended by Center for Disease Control  (CDC) for  adults born from [birth year] through 1963.   Completed  . Pneumonia vaccines  Completed   Preventive Care for Adults  A healthy lifestyle and preventive care can promote health and wellness. Preventive health guidelines for adults include the following key practices.  . A routine yearly physical is a good way to check with your health care provider about your health and preventive screening. It is a chance to share any concerns and updates on your health and to receive a thorough exam.  . Visit your dentist for a routine exam and preventive care every 6 months. Brush your teeth twice a day and floss once a day. Good oral hygiene prevents tooth decay and gum disease.  . The frequency of eye exams is based on your age, health, family medical history, use  of contact lenses, and other factors. Follow your health care provider's recommendations for frequency of eye exams.  . Eat a healthy diet. Foods like vegetables, fruits, whole grains, low-fat dairy products, and lean protein foods contain the nutrients you need without too many calories. Decrease your intake of foods high in solid fats, added sugars, and salt. Eat the right amount of calories for you. Get information about a proper diet from your health care provider, if necessary.  . Regular  physical exercise is one of the most important things you can do for your health. Most adults should get at least 150 minutes of moderate-intensity exercise (any activity that increases your heart rate and causes you to sweat) each week. In addition, most adults need muscle-strengthening exercises on 2 or more days a week.  Silver Sneakers may be a benefit available to you. To determine eligibility, you may visit the website: www.silversneakers.com or contact program at (747)725-3276 Mon-Fri between 8AM-8PM.   . Maintain a healthy weight. The body mass index (BMI) is a screening tool to identify possible weight problems. It provides an estimate of body fat based on height and weight. Your health care provider can find your BMI and can help you achieve or maintain a healthy weight.   For adults 20 years and older: ? A BMI below 18.5 is considered underweight. ? A BMI of 18.5 to 24.9 is normal. ? A BMI of 25 to 29.9 is considered overweight. ? A BMI of 30 and above is considered obese.   . Maintain normal blood lipids and cholesterol levels by exercising and minimizing your intake of saturated fat. Eat a balanced diet with plenty of fruit and vegetables. Blood tests for lipids and cholesterol should begin at age 10 and be repeated every 5 years. If your lipid or cholesterol levels are high, you are over 50, or you are at high risk for heart disease, you may  need your cholesterol levels checked more frequently. Ongoing high lipid and cholesterol levels should be treated with medicines if diet and exercise are not working.  . If you smoke, find out from your health care provider how to quit. If you do not use tobacco, please do not start.  . If you choose to drink alcohol, please do not consume more than 2 drinks per day. One drink is considered to be 12 ounces (355 mL) of beer, 5 ounces (148 mL) of wine, or 1.5 ounces (44 mL) of liquor.  . If you are 13-43 years old, ask your health care provider if  you should take aspirin to prevent strokes.  . Use sunscreen. Apply sunscreen liberally and repeatedly throughout the day. You should seek shade when your shadow is shorter than you. Protect yourself by wearing long sleeves, pants, a wide-brimmed hat, and sunglasses year round, whenever you are outdoors.  . Once a month, do a whole body skin exam, using a mirror to look at the skin on your back. Tell your health care provider of new moles, moles that have irregular borders, moles that are larger than a pencil eraser, or moles that have changed in shape or color.

## 2017-08-18 NOTE — Assessment & Plan Note (Signed)
S: on omeprazole- when misses doses gets breakthrough.  A/P: advised zantac trial instead of just hard stop on PPI. She will let me know how this goes at next visit

## 2017-08-18 NOTE — Assessment & Plan Note (Signed)
S: likely poorly controlled as she stopped her atorvastatin 10mg  (had been taking half tab years ago). No myalgias.  Lab Results  Component Value Date   CHOL 159 08/17/2014   HDL 30.10 (L) 08/17/2014   LDLCALC 110 (H) 08/17/2014   LDLDIRECT 97.0 06/28/2015   TRIG 96.0 08/17/2014   CHOLHDL 5 08/17/2014   A/P: update lipids and calculate 10 year risk. Discussed if over 10% likely advise statin

## 2017-08-19 LAB — LIPID PANEL
Cholesterol: 194 mg/dL (ref 0–200)
HDL: 52 mg/dL (ref >39.00)
LDL Cholesterol: 120 mg/dL — ABNORMAL HIGH (ref 0–99)
NonHDL: 142.43
Total CHOL/HDL Ratio: 4
Triglycerides: 114 mg/dL (ref 0.0–149.0)
VLDL: 22.8 mg/dL (ref 0.0–40.0)

## 2017-08-19 LAB — COMPREHENSIVE METABOLIC PANEL
ALT: 22 U/L (ref 0–35)
AST: 20 U/L (ref 0–37)
Albumin: 4.3 g/dL (ref 3.5–5.2)
Alkaline Phosphatase: 74 U/L (ref 39–117)
BUN: 13 mg/dL (ref 6–23)
CO2: 29 mEq/L (ref 19–32)
Calcium: 9 mg/dL (ref 8.4–10.5)
Chloride: 100 mEq/L (ref 96–112)
Creatinine, Ser: 0.73 mg/dL (ref 0.40–1.20)
GFR: 83.81 mL/min (ref >60.00)
Glucose, Bld: 71 mg/dL (ref 70–99)
Potassium: 3.6 mEq/L (ref 3.5–5.1)
Sodium: 139 mEq/L (ref 135–145)
Total Bilirubin: 0.7 mg/dL (ref 0.2–1.2)
Total Protein: 7.3 g/dL (ref 6.0–8.3)

## 2017-08-19 LAB — CBC
HCT: 44.9 % (ref 36.0–46.0)
Hemoglobin: 14.8 g/dL (ref 12.0–15.0)
MCHC: 33 g/dL (ref 30.0–36.0)
MCV: 93.4 fl (ref 78.0–100.0)
Platelets: 301 10*3/uL (ref 150.0–400.0)
RBC: 4.81 Mil/uL (ref 3.87–5.11)
RDW: 14.8 % (ref 11.5–15.5)
WBC: 11.4 10*3/uL — ABNORMAL HIGH (ref 4.0–10.5)

## 2017-08-19 LAB — TSH: TSH: 2.81 u[IU]/mL (ref 0.35–4.50)

## 2017-08-21 ENCOUNTER — Ambulatory Visit: Payer: Self-pay

## 2017-08-21 NOTE — Telephone Encounter (Signed)
   Reason for Disposition . Care advice for mild cough, questions about  Answer Assessment - Initial Assessment Questions 1. ONSET: "When did the nasal discharge start?"      Sore throat started Sunday 2. AMOUNT: "How much discharge is there?"      Moderate 3. COUGH: "Do you have a cough?" If yes, ask: "Describe the color of your sputum" (clear, white, yellow, green)     Pale yellow 4. RESPIRATORY DISTRESS: "Describe your breathing."      No 5. FEVER: "Do you have a fever?" If so, ask: "What is your temperature, how was it measured, and when did it start?"     No 6. SEVERITY: "Overall, how bad are you feeling right now?" (e.g., doesn't interfere with normal activities, staying home from school/work, staying in bed)      Feels tired. 7. OTHER SYMPTOMS: "Do you have any other symptoms?" (e.g., sore throat, earache, wheezing, vomiting)     Sore throat, ears feel blocked 8. PREGNANCY: "Is there any chance you are pregnant?" "When was your last menstrual period?"     No  Protocols used: COMMON COLD-A-AH  Pt. Given home remedy advice. Instructed if no better next week, to call back. Verbalizes understanding.

## 2017-08-22 ENCOUNTER — Other Ambulatory Visit: Payer: Self-pay | Admitting: *Deleted

## 2017-08-22 MED ORDER — ATORVASTATIN CALCIUM 10 MG PO TABS: 10.0000 mg | ORAL_TABLET | Freq: Every day | ORAL | 3 refills | Status: DC

## 2017-11-05 DIAGNOSIS — H903 Sensorineural hearing loss, bilateral: Secondary | ICD-10-CM | POA: Diagnosis not present

## 2017-11-13 ENCOUNTER — Encounter: Payer: Self-pay | Admitting: Sports Medicine

## 2017-11-13 ENCOUNTER — Ambulatory Visit: Payer: Medicare Other

## 2017-11-13 ENCOUNTER — Ambulatory Visit (INDEPENDENT_AMBULATORY_CARE_PROVIDER_SITE_OTHER): Payer: PPO | Admitting: Sports Medicine

## 2017-11-13 ENCOUNTER — Ambulatory Visit: Payer: Self-pay

## 2017-11-13 ENCOUNTER — Ambulatory Visit (INDEPENDENT_AMBULATORY_CARE_PROVIDER_SITE_OTHER): Payer: Medicare Other | Admitting: Physician Assistant

## 2017-11-13 ENCOUNTER — Encounter: Payer: Self-pay | Admitting: Physician Assistant

## 2017-11-13 VITALS — BP 128/84 | HR 71 | Ht 64.0 in

## 2017-11-13 VITALS — BP 128/84 | HR 71 | Temp 98.0°F

## 2017-11-13 DIAGNOSIS — M25569 Pain in unspecified knee: Secondary | ICD-10-CM

## 2017-11-13 DIAGNOSIS — M25562 Pain in left knee: Secondary | ICD-10-CM

## 2017-11-13 DIAGNOSIS — M25561 Pain in right knee: Secondary | ICD-10-CM | POA: Diagnosis not present

## 2017-11-13 MED ORDER — LEVOTHYROXINE SODIUM 75 MCG PO TABS: 75.0000 ug | ORAL_TABLET | Freq: Every day | ORAL | 3 refills | Status: DC

## 2017-11-13 MED ORDER — OMEPRAZOLE 20 MG PO CPDR: 20.0000 mg | DELAYED_RELEASE_CAPSULE | Freq: Every day | ORAL | 3 refills | Status: DC

## 2017-11-13 MED ORDER — BISOPROLOL-HYDROCHLOROTHIAZIDE 2.5-6.25 MG PO TABS: 1.0000 | ORAL_TABLET | Freq: Every day | ORAL | 3 refills | Status: DC

## 2017-11-13 MED ORDER — ATORVASTATIN CALCIUM 10 MG PO TABS: 10.0000 mg | ORAL_TABLET | Freq: Every day | ORAL | 3 refills | Status: DC

## 2017-11-13 NOTE — Patient Instructions (Signed)

## 2017-11-13 NOTE — Procedures (Signed)
X-Rays obtained at [facility] Interpreted by myself [name], DO) during office visit.  Results were reviewed with the patient at the time of the visit.   2V Bilateral Knee  FINDINGS:   AP views have overall well-maintained joint space.    There are some small osteophytic spurs along the lateral compartment of the right knee and small soft tissue prominence of the left lateral knee that is nonspecific.    No erosive changes.    Small amount of patellofemoral degeneration with small osteophytic spurs and slight cortical irregularity.  Right, greater than left.    There is an additional anterior prominence of the patella on the left is not present on the right correlates with physical exam findings.  This is nonspecific and likely reflects hyperostosis. IMPRESSION:  1. Mild bilateral knee osteoarthritis worse in the patellofemoral compartment 2. Hyperostosis of the anterior left patella, incidental

## 2017-11-13 NOTE — Progress Notes (Signed)
Kelly Frazier. Kelly Frazier, [place] at [place] - 72 y.o. female MRN [id number]  Date of birth: 13-May-1946  Visit Date: 11/13/2017  PCP: Marin Olp, MD   Referred by: Marin Olp, MD   Scribe for today's visit: Josepha Pigg, CMA     SUBJECTIVE:  Kelly Frazier is here for New Patient (Initial Visit) (knee pain)  Her L knee pain symptoms INITIALLY: Began approximately 1 month ago, no known injury but have been worsening progressively over the past several weeks.  Pain has worsened over the past 1 week after trying to walk up a flight of steps.  She denies any significant locking, clicking or giving way.  Anti-inflammatories only minimal minimally helpful.  Described as severe.  nonradiating Worsened with Weightbearing in knee flexion/extension. Improved with resting Additional associated symptoms include: Pain is mostly posterior    At this time symptoms are worsening compared to onset, was walking in the office today and felt her knee pop, she is now in a wheel chair.     ROS Reports night time disturbances. Denies fevers, chills, or night sweats. Denies unexplained weight loss. Denies personal history of cancer. Denies changes in bowel or bladder habits. Denies recent unreported falls. Denies new or worsening dyspnea or wheezing. Denies headaches or dizziness.  Denies numbness, tingling or weakness  In the extremities.  Denies lower extremity edema    HISTORY & PERTINENT PRIOR DATA:  Prior History reviewed and updated per electronic medical record.  Significant history, findings, studies and interim changes include:  reports that  has never smoked. she has never used smokeless tobacco. No results for input(s): HGBA1C, LABURIC, CREATINE in the last 8760 hours. No specialty comments available. Problem  Arthralgia of Both Knees    OBJECTIVE:  VS:  HT:5\' 4"  (162.6 cm)   WT:   BMI:      BP:128/84  HR:71bpm  TEMP: ( )  RESP:98 %   PHYSICAL EXAM: Constitutional: WDWN, Non-toxic appearing. Psychiatric: Alert & appropriately interactive.  Not depressed or anxious appearing. Respiratory: No increased work of breathing.  Trachea Midline Eyes: Pupils are equal.  EOM intact without nystagmus.  No scleral icterus  NEUROVASCULAR exam: No clubbing or cyanosis appreciated No significant venous stasis changes Capillary Refill: normal, less than 2 seconds   Left knee:   Left Knee  Alignment & Contours: normal Skin: No overlying erythema/ecchymosis Effusion: none   Generalized Synovitis: none Knee Tenderness: Generalized TTP diffusely, no focal findings Gait: antalgic and currently in a wheelchair   RANGE OF MOTION & STRENGTH  EXTENSION: Normal  with severe pain Strength: 5/5 FLEXION: Normal with moderate pain Strength: 5/5   LIGAMENTOUS TESTING  Varus & Valgus Strain: stable to testing Anterior & Posterior Drawer: stable to testing:   SPECIALITY TESTING:  Crepitation with Patellar Grind: Yes Patellar Apprehension: No Mcmurray's: Negative Thessaly: Not Tested      ASSESSMENT & PLAN:   1. Left knee pain, unspecified chronicity   2. Arthralgia of both knees    ++++++++++++++++++++++++++++++++++++++++++++ Orders & Meds:  Orders Placed This Encounter  Procedures  . [place] MSK POCT ULTRASOUND  . XR Knee 1-2 Views Left  . XR Knee 1-2 Views Right   No orders of the defined types were placed in this encounter.   ++++++++++++++++++++++++++++++++++++++++++++ PLAN:   Findings:  Patient does have underlying mild patellofemoral wear and I suspect this is acute flareup of that.  She does  report some worsening posterior pain and small Baker's cyst is seen on MSK ultrasound today however this could be intermittent in nature.  No obvious tendon disruption or significant soft tissue swelling.  Injection today with close 2-week follow-up  She does report right knee is  typically more painful than the left and she does have some underlying osteoarthritis on the side as well.  Can consider injections on the right if any lack of improvement.   No problem-specific Assessment & Plan notes found for this encounter.   Follow-up: Return in about 2 weeks (around 11/27/2017).   Pertinent documentation may be included in additional procedure notes, imaging studies, problem based documentation and patient instructions. Please see these sections of the encounter for additional information regarding this visit. CMA/ATC served as Education administrator during this visit. History, Physical, and Plan performed by medical provider. Documentation and orders reviewed and attested to.      [name], [place] [facility] Physician

## 2017-11-13 NOTE — Procedures (Signed)
PROCEDURE NOTE:  Ultrasound Guided: Injection: left knee Images were obtained and interpreted by myself, Teresa Coombs, DO  Images have been saved and stored to PACS system. Images obtained on: GE S7 Ultrasound machine  ULTRASOUND FINDINGS:  Mild degenerative spurring but minimal.  There is some bulging of the lateral and medial meniscus without overt tearing. No effusion  DESCRIPTION OF PROCEDURE:  The patient's clinical condition is marked by substantial pain and/or significant functional disability. Other conservative therapy has not provided relief, is contraindicated, or not appropriate. There is a reasonable likelihood that injection will significantly improve the patient's pain and/or functional impairment.  After discussing the risks, benefits and expected outcomes of the injection and all questions were reviewed and answered, the patient wished to undergo the above named procedure. Verbal consent was obtained.  The ultrasound was used to identify the target structure and adjacent neurovascular structures. The skin was then prepped in sterile fashion and the target structure was injected under direct visualization using sterile technique as below:  PREP: Alcohol, Ethel Chloride APPROACH: superiolateral, stopcock technique, 18g 1.5in.  INJECTATE: 2cc 0.5% marcaine, 2cc 40mg /mL DepoMedrol   ASPIRATE: None   DRESSING: Band-Aid  Post procedural instructions including recommending icing and warning signs for infection were reviewed.   This procedure was well tolerated and there were no complications.   IMPRESSION: Succesful Ultrasound Guided: Injection

## 2017-11-16 NOTE — Progress Notes (Signed)
Patient originally placed on my schedule but is going to see Dr. Teresa Coombs instead.  I did not see this patient.

## 2017-11-27 ENCOUNTER — Ambulatory Visit (INDEPENDENT_AMBULATORY_CARE_PROVIDER_SITE_OTHER): Payer: PPO | Admitting: Sports Medicine

## 2017-11-27 ENCOUNTER — Encounter: Payer: Self-pay | Admitting: Sports Medicine

## 2017-11-27 VITALS — BP 116/84 | HR 65 | Ht 64.0 in | Wt 182.2 lb

## 2017-11-27 DIAGNOSIS — M5442 Lumbago with sciatica, left side: Secondary | ICD-10-CM

## 2017-11-27 DIAGNOSIS — G8929 Other chronic pain: Secondary | ICD-10-CM

## 2017-11-27 DIAGNOSIS — M25562 Pain in left knee: Secondary | ICD-10-CM

## 2017-11-27 DIAGNOSIS — M25561 Pain in right knee: Secondary | ICD-10-CM

## 2017-11-27 DIAGNOSIS — M4727 Other spondylosis with radiculopathy, lumbosacral region: Secondary | ICD-10-CM | POA: Diagnosis not present

## 2017-11-27 MED ORDER — BACLOFEN 10 MG PO TABS: 5.0000 mg | ORAL_TABLET | Freq: Every evening | ORAL | 1 refills | Status: DC | PRN

## 2017-11-27 NOTE — Progress Notes (Signed)
  As needed. Kelly Frazier. , [place] at [facility] [phone]  Kelly Frazier - 72 y.o. female MRN [id number]  Date of birth: Mar 02, 1946  Visit Date:   PCP: Kelly Olp, MD   Referred by: Kelly Olp, MD  Please see additional documentation for HPI, review of systems.  HISTORY & PERTINENT PRIOR DATA:  Prior History reviewed and updated per electronic medical record.  Significant history, findings, studies and interim changes include:  reports that  has never smoked. she has never used smokeless tobacco. No results for input(s): HGBA1C, LABURIC, CREATINE in the last 8760 hours. No specialty comments available. No problems updated.  OBJECTIVE:  VS:  HT:5\' 4"  (162.6 cm)   WT:182 lb 3.2 oz (82.6 kg)  BMI:31.26    BP:116/84  HR:65bpm  TEMP: ( )  RESP:95 %   PHYSICAL EXAM: WDWN, Non-toxic appearing. Psychiatric: Alert & appropriately interactive.  Not depressed or anxious appearing. Respiratory: No increased work of breathing.  Trachea Midline Eyes: Pupils are equal.  EOM intact without nystagmus.  No scleral icterus  NEUROVASCULAR exam: No clubbing or cyanosis appreciated No significant venous stasis changes normal, less than 2 seconds  Left knee   Skin: No overlying erythema/ecchymosis Effusion: none   Generalized Synovitis: none Tenderness: No focal bony TTP   RANGE OF MOTION & STRENGTH  Normal   LIGAMENTOUS TESTING  Normal   SPECIALITY TESTING:  ttp r greater sciatic and left pop space no bakers cyst    ASSESSMENT & PLAN:   1. Osteoarthritis of spine with radiculopathy, lumbosacral region   2. Arthralgia of both knees   3. Chronic right-sided low back pain with left-sided sciatica    Orders & Meds: No orders of the defined types were placed in this encounter.  Meds ordered this encounter  Medications  . baclofen (LIORESAL) 10 MG tablet    Sig: Take 0.5 tablets (5 mg total) by mouth at  bedtime as needed and may repeat dose one time if needed for muscle spasms.    Dispense:  30 each    Refill:  1     PLAN: Symptoms are consistent with likely radiculitis coupled with osteoarthritis of the knees.  We will have her begin on home therapeutic exercises per procedure note and discussed the options for further diagnostic evaluation but we will defer at this time.  If any lack of improvement can consider further testing and referral to formal physical therapy.      call if you need [place]    No problem-specific Assessment & Plan notes found for this encounter.  Follow-up: Return in about 4 weeks (around 12/25/2017).

## 2017-11-27 NOTE — Progress Notes (Signed)
PROCEDURE NOTE: THERAPEUTIC EXERCISES (97110) 15 minutes spent for Therapeutic exercises as below and as referenced in the AVS. This included exercises focusing on stretching, strengthening, with significant focus on eccentric aspects.  Proper technique shown and discussed handout in great detail with ATC. All questions were discussed and answered.   Long term goals include an improvement in range of motion, strength, endurance as well as avoiding reinjury. Frequency of visits is one time as determined during today's  office visit. Frequency of exercises to be performed is as per handout.  EXERCISES REVIEWED:  lumbar spine mobilization and stretching Piriformis Stretching Foundations training

## 2017-11-27 NOTE — Patient Instructions (Addendum)
Please perform the exercise program that we have prepared for you and gone over in detail on a daily basis.  In addition to the handout you were provided you can access your program through: [email or web address]   Your unique program code [email or web address]   Also check out [employer]" which is a program developed by Dr. Minerva Ends.   There are links to a couple of his YouTube Videos below and I would like to see performing one of his videos 5-6 days per week.    A good intro video is: "Independence from Pain 7-minute Video" - [email or web address]   His more advanced video is: "Powerful Posture and Pain Relief: 12 minutes of Foundation Training" - https://youtu.[email or web address]  Do not try to attempt this entire video when first beginning.    Try breaking of each exercise that he goes into shorter segments.  Otherwise if they perform an exercise for 45 seconds, start with 15 seconds and rest and then resume when they begin the new activity.    If you work your way up to doing this 12 minute video, I expect you will see significant improvements in your pain.  If you enjoy his videos and would like to find out more you can look on his website: [email or web address]  He has a workout streaming option as well as a DVD set available for purchase.  Amazon has the best price for his DVDs.

## 2017-11-27 NOTE — Progress Notes (Signed)
  Kelly Frazier - 72 y.o. female MRN [id number]  Date of birth: 07/31/1946  Scribe for today's visit: Josepha Pigg, CMA     SUBJECTIVE:  Kelly Frazier is here for No chief complaint on file.  11/13/17: Her L knee pain symptoms INITIALLY: Began approximately 1 month ago, no known injury but have been worsening progressively over the past several weeks.  Pain has worsened over the past 1 week after trying to walk up a flight of steps.  She denies any significant locking, clicking or giving way.  Anti-inflammatories only minimal minimally helpful.  Described as severe. nonradiating Worsened with Weightbearing in knee flexion/extension. Improved with resting Additional associated symptoms include: Pain is mostly posterior At this time symptoms are worsening compared to onset, was walking in the office today and felt her knee pop, she is now in a wheel chair.   11/27/17: Compared to the last office visit, her previously described symptoms are improving. She does report a sensation of tightness on the posterior aspect of her knee, like a rubber band is around the knee. This started about 4 days after her steroid injection. Pain is present when rolong over in bed and sitting, standing.  Current symptoms are mild & are radiating to posterior leg - about 3-4" above and below the knee.  Current therapies: She has been taking Advil with some relief. She has tried icing the knee with some relief.  She received steroid injection 11/13/16 and tolerated this well.   Pt c/o lower back pain which started 3-4 days ago. She was weeding her garden and strained her back. She reports having osteoarthritis and has chronic lower back pain which comes and goes.  Pain is mild currently but severe with twisting. Pain radiates into the R leg.  Worsened with twisting, bending, turning. Improves with sitting, resting, lying down.  She denies loss of control of bladder or bowel functions.  She has tried taking Advil  and using ice with some relief.    ROS Denies night time disturbances. Denies fevers, chills, or night sweats. Denies unexplained weight loss. Reports personal history of cancer, basal call carcinoma. Denies changes in bowel or bladder habits. Denies recent unreported falls. Denies new or worsening dyspnea or wheezing. Denies headaches or dizziness.  Reports numbness, tingling or weakness in the extremities.  Denies dizziness or presyncopal episodes Denies lower extremity edema      Please see additional documentation for Objective, Assessment and Plan sections. Pertinent additional documentation may be included in corresponding procedure notes, imaging studies, problem based documentation and patient instructions. Please see these sections of the encounter for additional information regarding this visit.  CMA/ATC served as Education administrator during this visit. History, Physical, and Plan performed by medical provider. Documentation and orders reviewed and attested to.      [name], [place] [facility] Physician

## 2017-12-17 DIAGNOSIS — H903 Sensorineural hearing loss, bilateral: Secondary | ICD-10-CM | POA: Diagnosis not present

## 2017-12-23 ENCOUNTER — Ambulatory Visit: Payer: PPO | Admitting: Family Medicine

## 2017-12-25 ENCOUNTER — Encounter: Payer: Self-pay | Admitting: Sports Medicine

## 2017-12-25 ENCOUNTER — Ambulatory Visit (INDEPENDENT_AMBULATORY_CARE_PROVIDER_SITE_OTHER): Payer: PPO | Admitting: Sports Medicine

## 2017-12-25 VITALS — BP 110/82 | HR 69 | Ht 64.0 in | Wt 182.0 lb

## 2017-12-25 DIAGNOSIS — M4727 Other spondylosis with radiculopathy, lumbosacral region: Secondary | ICD-10-CM | POA: Diagnosis not present

## 2017-12-25 DIAGNOSIS — M170 Bilateral primary osteoarthritis of knee: Secondary | ICD-10-CM | POA: Diagnosis not present

## 2017-12-25 DIAGNOSIS — M25562 Pain in left knee: Secondary | ICD-10-CM

## 2017-12-25 DIAGNOSIS — M25561 Pain in right knee: Secondary | ICD-10-CM

## 2017-12-25 DIAGNOSIS — M1712 Unilateral primary osteoarthritis, left knee: Secondary | ICD-10-CM | POA: Diagnosis not present

## 2017-12-25 MED ORDER — DICLOFENAC SODIUM 1 % TD GEL: TRANSDERMAL | 1 refills | Status: DC

## 2017-12-25 NOTE — Progress Notes (Signed)
Kelly Frazier. , [facility] [place] - 72 y.o. female MRN [id number]  Date of birth: [birth year]  Visit Date: 12/25/2017  PCP: Marin Olp, MD   Referred by: Marin Olp, MD  Scribe for today's visit: Kelly Frazier, CMA     SUBJECTIVE:  Kelly Frazier is here for No chief complaint on file.  11/13/17: Her L knee pain symptoms INITIALLY: Began approximately 1 month ago, no known injury but have been worsening progressively over the past several weeks.  Pain has worsened over the past 1 week after trying to walk up a flight of steps.  She denies any significant locking, clicking or giving way.  Anti-inflammatories only minimal minimally helpful.  Described as severe. nonradiating Worsened with Weightbearing in knee flexion/extension. Improved with resting Additional associated symptoms include: Pain is mostly posterior At this time symptoms are worsening compared to onset, was walking in the office today and felt her knee pop, she is now in a wheel chair.   11/27/17: Compared to the last office visit, her previously described symptoms are improving. She does report a sensation of tightness on the posterior aspect of her knee, like a rubber band is around the knee. This started about 4 days after her steroid injection. Pain is present when rolong over in bed and sitting, standing.  Current symptoms are mild & are radiating to posterior leg - about 3-4" above and below the knee.  Current therapies: She has been taking Advil with some relief. She has tried icing the knee with some relief.  She received steroid injection 11/13/16 and tolerated this well.   Pt c/o lower back pain which started 3-4 days ago. She was weeding her garden and strained her back. She reports having osteoarthritis and has chronic lower back pain which comes and goes.  Pain is mild currently but severe with twisting. Pain  radiates into the R leg.  Worsened with twisting, bending, turning. Improves with sitting, resting, lying down.  She denies loss of control of bladder or bowel functions.  She has tried taking Advil and using ice with some relief.   12/25/2017: Compared to the last office visit, her previously described symptoms are improving.  Current symptoms are moderate & are radiating to the anterior lower leg and posterior thigh.  She has been doing HEP sporadically. She feels that she responded well to the injection that she got 11/27/17. She still has a tightness across the back of her knee and it still feels unstable. Sometimes the pain is worse than others.  She has been icing her knee prn and taking Advil with some relief.   Pt reports that back pain has resolved. She tried taking Baclofen a few times and got minimal relief.   ROS Denies night time disturbances. Denies fevers, chills, or night sweats. Denies unexplained weight loss. Reports personal history of cancer. Denies changes in bowel or bladder habits. Denies recent unreported falls. Denies new or worsening dyspnea or wheezing. Denies headaches or dizziness.  Reports numbness, tingling or weakness  In the extremities.  Denies dizziness or presyncopal episodes Denies lower extremity edema    HISTORY & PERTINENT PRIOR DATA:  Prior History reviewed and updated per electronic medical record.  Significant/pertinent history, findings, studies include:  reports that she has never smoked. She has never used smokeless tobacco. No results for input(s): HGBA1C, LABURIC, CREATINE in the last 8760 hours. No specialty comments available.  No problems updated.  OBJECTIVE:  VS:  HT:5\' 4"  (162.6 cm)   WT:182 lb (82.6 kg)  BMI:31.22    BP:110/82  HR:69bpm  TEMP: ( )  RESP:96 %   PHYSICAL EXAM: Constitutional: WDWN, Non-toxic appearing. Psychiatric: Alert & appropriately interactive.  Not depressed or anxious appearing. Respiratory: No  increased work of breathing.  Trachea Midline Eyes: Pupils are equal.  EOM intact without nystagmus.  No scleral icterus  Vascular Exam: warm to touch no edema  lower extremity neuro exam: unremarkable  MSK Exam: Significantly improved generalized swelling and pain.  Only trace medial lateral joint line pain.  Limitedly stable.  Negative straight leg raise bilaterally.   ASSESSMENT & PLAN:   1. Arthralgia of both knees   2. Osteoarthritis of spine with radiculopathy, lumbosacral region   3. Primary osteoarthritis of both knees     PLAN: She is done quite well following the last injections.  Given the ongoing symptoms that she is having with her knee especially posterior aspect is likely a small component of lumbar radiculopathy.  If any worsening symptoms further diagnostic evaluation of her lumbar spine will be recommended.  We will have her try Voltaren gel in the interim and follow-up for repeat injections and consideration of Zilretta versus Visco supplementation if needed..  Follow-up: Return in about 6 weeks (around 02/05/2018) for consider repeat injections.      Please see additional documentation for Objective, Assessment and Plan sections. Pertinent additional documentation may be included in corresponding procedure notes, imaging studies, problem based documentation and patient instructions. Please see these sections of the encounter for additional information regarding this visit.  CMA/ATC served as Education administrator during this visit. History, Physical, and Plan performed by medical provider. Documentation and orders reviewed and attested to.      [name], [facility] Physician

## 2018-01-05 DIAGNOSIS — H04123 Dry eye syndrome of bilateral lacrimal glands: Secondary | ICD-10-CM | POA: Diagnosis not present

## 2018-01-05 DIAGNOSIS — H02832 Dermatochalasis of right lower eyelid: Secondary | ICD-10-CM | POA: Diagnosis not present

## 2018-01-05 DIAGNOSIS — H524 Presbyopia: Secondary | ICD-10-CM | POA: Diagnosis not present

## 2018-01-05 DIAGNOSIS — H02831 Dermatochalasis of right upper eyelid: Secondary | ICD-10-CM | POA: Diagnosis not present

## 2018-01-05 DIAGNOSIS — H26492 Other secondary cataract, left eye: Secondary | ICD-10-CM | POA: Diagnosis not present

## 2018-01-20 ENCOUNTER — Encounter: Payer: Self-pay | Admitting: Sports Medicine

## 2018-01-22 ENCOUNTER — Encounter: Payer: Self-pay | Admitting: Sports Medicine

## 2018-02-05 ENCOUNTER — Ambulatory Visit: Payer: PPO | Admitting: Sports Medicine

## 2018-03-01 ENCOUNTER — Encounter: Payer: Self-pay | Admitting: Sports Medicine

## 2018-03-01 ENCOUNTER — Ambulatory Visit (INDEPENDENT_AMBULATORY_CARE_PROVIDER_SITE_OTHER): Payer: PPO | Admitting: Sports Medicine

## 2018-03-01 ENCOUNTER — Ambulatory Visit: Payer: Self-pay

## 2018-03-01 ENCOUNTER — Encounter: Payer: Self-pay | Admitting: Family Medicine

## 2018-03-01 ENCOUNTER — Ambulatory Visit (INDEPENDENT_AMBULATORY_CARE_PROVIDER_SITE_OTHER): Payer: PPO | Admitting: Family Medicine

## 2018-03-01 VITALS — BP 110/78 | HR 70 | Ht 64.0 in | Wt 183.6 lb

## 2018-03-01 VITALS — BP 110/78 | HR 70 | Temp 98.7°F | Ht 64.0 in | Wt 183.6 lb

## 2018-03-01 DIAGNOSIS — E039 Hypothyroidism, unspecified: Secondary | ICD-10-CM | POA: Diagnosis not present

## 2018-03-01 DIAGNOSIS — M25562 Pain in left knee: Secondary | ICD-10-CM

## 2018-03-01 DIAGNOSIS — M25561 Pain in right knee: Secondary | ICD-10-CM

## 2018-03-01 DIAGNOSIS — K219 Gastro-esophageal reflux disease without esophagitis: Secondary | ICD-10-CM | POA: Diagnosis not present

## 2018-03-01 DIAGNOSIS — M170 Bilateral primary osteoarthritis of knee: Secondary | ICD-10-CM

## 2018-03-01 DIAGNOSIS — E785 Hyperlipidemia, unspecified: Secondary | ICD-10-CM | POA: Diagnosis not present

## 2018-03-01 DIAGNOSIS — I10 Essential (primary) hypertension: Secondary | ICD-10-CM

## 2018-03-01 LAB — TSH: TSH: 1.31 u[IU]/mL (ref 0.35–4.50)

## 2018-03-01 LAB — CBC WITH DIFFERENTIAL/PLATELET
Basophils Absolute: 0.1 10*3/uL (ref 0.0–0.1)
Basophils Relative: 0.6 % (ref 0.0–3.0)
Eosinophils Absolute: 0.2 10*3/uL (ref 0.0–0.7)
Eosinophils Relative: 2.1 % (ref 0.0–5.0)
HCT: 42.1 % (ref 36.0–46.0)
Hemoglobin: 14.2 g/dL (ref 12.0–15.0)
Lymphocytes Relative: 20.4 % (ref 12.0–46.0)
Lymphs Abs: 2.3 10*3/uL (ref 0.7–4.0)
MCHC: 33.8 g/dL (ref 30.0–36.0)
MCV: 89.9 fl (ref 78.0–100.0)
Monocytes Absolute: 0.5 10*3/uL (ref 0.1–1.0)
Monocytes Relative: 4.8 % (ref 3.0–12.0)
Neutro Abs: 8.3 10*3/uL — ABNORMAL HIGH (ref 1.4–7.7)
Neutrophils Relative %: 72.1 % (ref 43.0–77.0)
Platelets: 279 10*3/uL (ref 150.0–400.0)
RBC: 4.69 Mil/uL (ref 3.87–5.11)
RDW: 14.6 % (ref 11.5–15.5)
WBC: 11.5 10*3/uL — ABNORMAL HIGH (ref 4.0–10.5)

## 2018-03-01 LAB — LDL CHOLESTEROL, DIRECT: Direct LDL: 73 mg/dL

## 2018-03-01 NOTE — Assessment & Plan Note (Signed)
S: last visit advised zantac trial instead of hard stop of omeprazole. She has a good # of the omeprazole left she would like to use A/P:  We discussed when she has about 7 days left of omeprazole- trial zantac 150mg  twice a day as alternate as this potentially has safer long term profile of side effects

## 2018-03-01 NOTE — Progress Notes (Signed)
Your CBC was normal (blood counts, infection fighting cells, platelets) other than hair high infection fighting cells- once again stable over last several years. Can continue to monitor Your cholesterol looks much better!  Your thyroid was normal.

## 2018-03-01 NOTE — Procedures (Addendum)
PROCEDURE NOTE:  Ultrasound Guided: Injection: Left knee Images were obtained and interpreted by myself, Teresa Coombs, DO  Images have been saved and stored to PACS system. Images obtained on: GE S7 Ultrasound machine    ULTRASOUND FINDINGS:  small effusion.  Generalized synovitis is present  DESCRIPTION OF PROCEDURE:  The patient's clinical condition is marked by substantial pain and/or significant functional disability. Other conservative therapy has not provided relief, is contraindicated, or not appropriate. There is a reasonable likelihood that injection will significantly improve the patient's pain and/or functional impairment.   After discussing the risks, benefits and expected outcomes of the injection and all questions were reviewed and answered, the patient wished to undergo the above named procedure.  Verbal consent was obtained.  The ultrasound was used to identify the target structure and adjacent neurovascular structures. The skin was then prepped in sterile fashion and the target structure was injected under direct visualization using sterile technique as below:  Single injection performed as below: PREP: Alcohol and Ethel Chloride APPROACH:superiolateral, single injection, 21g 2 in. INJECTATE: 2 cc 0.5% Marcaine and 2 cc 40mg /mL DepoMedrol ASPIRATE: None DRESSING: Band-Aid  Post procedural instructions including recommending icing and warning signs for infection were reviewed.    This procedure was well tolerated and there were no complications.   IMPRESSION: Succesful Ultrasound Guided: Injection

## 2018-03-01 NOTE — Assessment & Plan Note (Signed)
S: poorly controlled on last check- restarted atorvastatin 10mg  at that time Lab Results  Component Value Date   CHOL 194 08/18/2017   HDL 52.00 08/18/2017   LDLCALC 120 (H) 08/18/2017   LDLDIRECT 97.0 06/28/2015   TRIG 114.0 08/18/2017   CHOLHDL 4 08/18/2017   A/P: update direct LDL today

## 2018-03-01 NOTE — Patient Instructions (Signed)

## 2018-03-01 NOTE — Progress Notes (Signed)
Subjective:  Kelly Frazier is a 72 y.o. year old very pleasant female patient who presents for/with See problem oriented charting ROS- No chest pain or shortness of breath. No headache or blurry vision.    Past Medical History-  Patient Active Problem List   Diagnosis Date Noted  . Solitary pulmonary nodule 01/10/2017    Priority: Medium  . Migraine 08/17/2014    Priority: Medium  . Hyperlipidemia 05/03/2007    Priority: Medium  . Essential hypertension 05/03/2007    Priority: Medium  . Hypothyroidism 04/12/2007    Priority: Medium  . Esophageal reflux 09/03/2010    Priority: Low  . Angioedema 06/21/2010    Priority: Low  . History of colonic polyps 09/27/2007    Priority: Low  . Osteoarthritis 04/12/2007    Priority: Low  . Arthralgia of both knees 11/13/2017  . Trigger finger, left 08/03/2017  . Snoring 06/05/2016  . Excessive daytime sleepiness 06/05/2016  . Shortness of breath 03/11/2016    Medications- reviewed and updated Current Outpatient Medications  Medication Sig Dispense Refill  . Multiple Vitamins-Minerals (WOMENS 50+ ADVANCED PO) Take 2 tablets by mouth daily.    Marland Kitchen atorvastatin (LIPITOR) 10 MG tablet Take 1 tablet (10 mg total) by mouth daily. 90 tablet 3  . bisoprolol-hydrochlorothiazide (ZIAC) 2.5-6.25 MG tablet Take 1 tablet by mouth daily. 90 tablet 3  . diclofenac sodium (VOLTAREN) 1 % GEL Apply topically to affected area qid 100 g 1  . EPINEPHrine (EPIPEN JR) 0.15 MG/0.3ML injection Inject 0.15 mg into the muscle as needed.      Marland Kitchen ibuprofen (ADVIL,MOTRIN) 200 MG tablet Take 200 mg by mouth every 6 (six) hours as needed for headache, mild pain or cramping.     Marland Kitchen levothyroxine (SYNTHROID, LEVOTHROID) 75 MCG tablet Take 1 tablet (75 mcg total) by mouth daily. 90 tablet 3  . omeprazole (PRILOSEC) 20 MG capsule Take 1 capsule (20 mg total) by mouth daily. 90 capsule 3   No current facility-administered medications for this visit.     Objective: BP  110/78 (BP Location: Left Arm, Patient Position: Sitting, Cuff Size: Normal)   Pulse 70   Temp 98.7 F (37.1 C)   Ht 5\' 4"  (1.626 m)   Wt 183 lb 9.6 oz (83.3 kg)   SpO2 96%   BMI 31.51 kg/m  Gen: NAD, resting comfortably CV: RRR no murmurs rubs or gallops Lungs: CTAB no crackles, wheeze, rhonchi Abdomen: soft/nontender/nondistended/normal bowel sounds.  Ext: no edema Skin: warm, dry  Assessment/Plan:  Other notes 1. Still with knee issues- injured it going to the gym. Cortisone shot. Didn't do exercises on her own that she was advised to do. Tightness behind left knee. Doesn't feel as strong. voltaren gel helps some. We were able to get her in with Dr. Paulla Fore this afternoon for follow up  Hypertension S: controlled on ziac BP Readings from Last 3 Encounters:  03/01/18 110/78  12/25/17 110/82  11/27/17 116/84  A/P: We discussed blood pressure goal of <140/90. Continue current meds  Hypothyroidism S: On thyroid medication- levothyroxine 75 mcg Lab Results  Component Value Date   TSH 2.81 08/18/2017  A/P: update TSH today  Hyperlipidemia S: poorly controlled on last check- restarted atorvastatin 10mg  at that time Lab Results  Component Value Date   CHOL 194 08/18/2017   HDL 52.00 08/18/2017   LDLCALC 120 (H) 08/18/2017   LDLDIRECT 97.0 06/28/2015   TRIG 114.0 08/18/2017   CHOLHDL 4 08/18/2017   A/P: update direct  LDL today   Esophageal reflux S: last visit advised zantac trial instead of hard stop of omeprazole. She has a good # of the omeprazole left she would like to use A/P:  We discussed when she has about 7 days left of omeprazole- trial zantac 150mg  twice a day as alternate as this potentially has safer long term profile of side effects   Future Appointments  Date Time Provider [place]  03/01/2018  2:00 PM [name], DO [facility]  08/19/2018  1:00 PM Williemae Area, RN [facility]   Return in about 6 months (around 08/31/2018) for  follow up- or sooner if needed.  Lab/Order associations: Hypothyroidism, unspecified type - Plan: TSH  Hyperlipidemia, unspecified hyperlipidemia type - Plan: CBC with Differential/Platelet, LDL cholesterol, direct  Return precautions advised.  Garret Reddish, MD

## 2018-03-01 NOTE — Patient Instructions (Addendum)
Health Maintenance Due  Topic Date Due  . MAMMOGRAM - please call the breast center and get an updated mammogram. Team please give her a handout for this 02/20/2018   Please stop by lab before you go  Then go back out to the lobby to check in for Dr. Paulla Fore for 2 PM visit

## 2018-03-01 NOTE — Progress Notes (Signed)
Kelly Frazier. Kelly Frazier, [facility] [place] - 72 y.o. female MRN [id number]  Date of birth: 10/28/1945  Visit Date: 03/01/2018  PCP: Marin Olp, MD   Referred by: Marin Olp, MD  Scribe(s) for today's visit: Josepha Pigg, CMA  SUBJECTIVE:  Kelly Frazier is here for Follow-up (L knee pain)   11/13/17: Her L knee pain symptoms INITIALLY: Began approximately 1 month ago, no known injury but have been worsening progressively over the past several weeks.  Pain has worsened over the past 1 week after trying to walk up a flight of steps.  She denies any significant locking, clicking or giving way.  Anti-inflammatories only minimal minimally helpful.  Described as severe. nonradiating Worsened with Weightbearing in knee flexion/extension. Improved with resting Additional associated symptoms include: Pain is mostly posterior At this time symptoms are worsening compared to onset, was walking in the office today and felt her knee pop, she is now in a wheel chair.   11/27/17: Compared to the last office visit, her previously described symptoms are improving. She does report a sensation of tightness on the posterior aspect of her knee, like a rubber band is around the knee. This started about 4 days after her steroid injection. Pain is present when rolong over in bed and sitting, standing.  Current symptoms are mild & are radiating to posterior leg - about 3-4" above and below the knee.  Current therapies: She has been taking Advil with some relief. She has tried icing the knee with some relief.  She received steroid injection 11/13/16 and tolerated this well.   Pt c/o lower back pain which started 3-4 days ago. She was weeding her garden and strained her back. She reports having osteoarthritis and has chronic lower back pain which comes and goes.  Pain is mild currently but severe with twisting. Pain radiates  into the R leg.  Worsened with twisting, bending, turning. Improves with sitting, resting, lying down.  She denies loss of control of bladder or bowel functions.  She has tried taking Advil and using ice with some relief.   12/25/2017: Compared to the last office visit, her previously described symptoms are improving.  Current symptoms are moderate & are radiating to the anterior lower leg and posterior thigh.  She has been doing HEP sporadically. She feels that she responded well to the injection that she got 11/27/17. She still has a tightness across the back of her knee and it still feels unstable. Sometimes the pain is worse than others.  She has been icing her knee prn and taking Advil with some relief. Pt reports that back pain has resolved. She tried taking Baclofen a few times and got minimal relief.   03/01/2018: Compared to the last office visit, her previously described symptoms are improving. She is still aware of the knee pain and still feels that the L knee is unstable. She has noticed some swelling around the knee. Pain and swelling seems to be worse with prolonged time standing, using stairs, or repetitive bending of the L knee.  Current symptoms are mild & are radiating to the lower leg and thigh, anterior and posterior.  She has been icing the knee and taking Advil with some relief. She has also been using Voltaren gel with some relief.    REVIEW OF SYSTEMS: Reports night time disturbances (about 1 night every 2-3 weeks). Denies fevers, chills, or night sweats.  Denies unexplained weight loss. Reports personal history of cancer. Denies changes in bowel or bladder habits. Denies recent unreported falls. Denies new or worsening dyspnea or wheezing. Denies headaches or dizziness.  Reports numbness, tingling or weakness  In the extremities.  Denies dizziness or presyncopal episodes Reports lower extremity edema    HISTORY & PERTINENT PRIOR DATA:  Prior History reviewed and  updated per electronic medical record.  Significant/pertinent history, findings, studies include:  reports that she has never smoked. She has never used smokeless tobacco. No results for input(s): HGBA1C, LABURIC, CREATINE in the last 8760 hours. No specialty comments available. No problems updated.  OBJECTIVE:  VS:  HT:5\' 4"  (162.6 cm)   WT:183 lb 9.6 oz (83.3 kg)  BMI:31.5    BP:110/78  HR:70bpm  TEMP: ( )  RESP:96 %   PHYSICAL EXAM: Constitutional: WDWN, Non-toxic appearing. Psychiatric: Alert & appropriately interactive.  Not depressed or anxious appearing. Respiratory: No increased work of breathing.  Trachea Midline Eyes: Pupils are equal.  EOM intact without nystagmus.  No scleral icterus  Vascular Exam: warm to touch no edema  lower extremity neuro exam: unremarkable  MSK Exam: Knees with generalized osteophytic bossing.  Left knee has a small amount of pain with palpation of the medial and lateral joint lines.  Lower extremity strength is otherwise intact.  She has 2 to 3 mm of opening with varus and valgus testing on the left.  Pain with McMurray's but no appreciable clicking.   ASSESSMENT & PLAN:   1. Arthralgia of both knees   2. Primary osteoarthritis of both knees     PLAN: Ultrasound-guided injection to the left knee performed today without significant complications.  We will plan to get her preapproved for Zilretta injections and follow-up in 10 weeks to consider pursuing this if having persistent symptoms.  Follow-up: Return in about 2 months (around 05/10/2018) for consideration of repeat injections.     Please see additional documentation for Objective, Assessment and Plan sections. Pertinent additional documentation may be included in corresponding procedure notes, imaging studies, problem based documentation and patient instructions. Please see these sections of the encounter for additional information regarding this visit.  CMA/ATC served as Education administrator  during this visit. History, Physical, and Plan performed by medical provider. Documentation and orders reviewed and attested to.      [name], [facility] Physician

## 2018-03-01 NOTE — Assessment & Plan Note (Signed)
S: On thyroid medication- levothyroxine 75 mcg Lab Results  Component Value Date   TSH 2.81 08/18/2017  A/P: update TSH today

## 2018-03-17 ENCOUNTER — Telehealth: Payer: Self-pay

## 2018-03-17 NOTE — Telephone Encounter (Signed)
-----   Message from Marin Olp, MD sent at 03/17/2018  9:09 AM EDT ----- Please call patient to encourage her to get her mammogram or get copy of mammogram if she has had it  Kelly Frazier

## 2018-03-17 NOTE — Telephone Encounter (Signed)
Called and left a voicemail message asking for a return phone call. Asked if patient had completed her mammogram or when she has it scheduled for?

## 2018-04-01 DIAGNOSIS — N3941 Urge incontinence: Secondary | ICD-10-CM | POA: Diagnosis not present

## 2018-04-01 DIAGNOSIS — N904 Leukoplakia of vulva: Secondary | ICD-10-CM | POA: Diagnosis not present

## 2018-04-01 DIAGNOSIS — Z01419 Encounter for gynecological examination (general) (routine) without abnormal findings: Secondary | ICD-10-CM | POA: Diagnosis not present

## 2018-04-02 ENCOUNTER — Telehealth: Payer: Self-pay | Admitting: Family Medicine

## 2018-04-02 NOTE — Telephone Encounter (Signed)
See note.   Copied from [place] [id number]. Topic: General - Other >> Apr 02, 2018  2:13 PM Keene Breath wrote: Reason for CRM: Patient is returning a call to speak with the nurse.  Please advise.  CB# [phone].

## 2018-04-02 NOTE — Telephone Encounter (Signed)
Called and asked patient if she had her mammogram done yet? She stated she will call Monday and schedule it

## 2018-04-05 ENCOUNTER — Other Ambulatory Visit: Payer: Self-pay | Admitting: Family Medicine

## 2018-04-05 DIAGNOSIS — Z1231 Encounter for screening mammogram for malignant neoplasm of breast: Secondary | ICD-10-CM

## 2018-04-08 NOTE — Telephone Encounter (Signed)
Patient returning a call for the nurse. Did not say what it was regarding. Would like a call back. Please advise.

## 2018-04-09 NOTE — Telephone Encounter (Signed)
Returned patient's call. She was calling regarding a call about an injection for a Baker's Cyst in the back of her knee. Please give her a call and discuss her seeing Dr. Paulla Fore. Thanks!

## 2018-04-09 NOTE — Telephone Encounter (Signed)
Called pt and left VM to call the office.  

## 2018-04-12 NOTE — Telephone Encounter (Signed)
Called pt and left VM to call the office. She needs/wants to schedule OV with Dr. Paulla Fore to have her bakers cyst looked at and possibly drained.

## 2018-04-12 NOTE — Telephone Encounter (Signed)
Pt returned call Friday evening and left VM. She was calling about the baker's cyst that she has and the possibility of getting a different injection for the.

## 2018-04-12 NOTE — Telephone Encounter (Signed)
Spoke with patient , she wanted to discuss coverage for Zilretta. She will think about Zilretta injection and call back if she would like to have it at her next visit.

## 2018-04-26 ENCOUNTER — Ambulatory Visit: Payer: Self-pay

## 2018-04-26 ENCOUNTER — Ambulatory Visit (INDEPENDENT_AMBULATORY_CARE_PROVIDER_SITE_OTHER): Payer: PPO | Admitting: Sports Medicine

## 2018-04-26 ENCOUNTER — Encounter: Payer: Self-pay | Admitting: Sports Medicine

## 2018-04-26 VITALS — BP 124/84 | HR 66 | Ht 64.0 in | Wt 187.2 lb

## 2018-04-26 DIAGNOSIS — M1712 Unilateral primary osteoarthritis, left knee: Secondary | ICD-10-CM

## 2018-04-26 DIAGNOSIS — M170 Bilateral primary osteoarthritis of knee: Secondary | ICD-10-CM | POA: Diagnosis not present

## 2018-04-26 NOTE — Procedures (Signed)
PROCEDURE NOTE:  Ultrasound Guided: Injection: Left knee Images were obtained and interpreted by myself, Teresa Coombs, DO  Images have been saved and stored to PACS system. Images obtained on: GE S7 Ultrasound machine    ULTRASOUND FINDINGS:  Mild synovitis, minimal effusion  DESCRIPTION OF PROCEDURE:  The patient's clinical condition is marked by substantial pain and/or significant functional disability. Other conservative therapy has not provided relief, is contraindicated, or not appropriate. There is a reasonable likelihood that injection will significantly improve the patient's pain and/or functional impairment.   After discussing the risks, benefits and expected outcomes of the injection and all questions were reviewed and answered, the patient wished to undergo the above named procedure.  Verbal consent was obtained.  The ultrasound was used to identify the target structure and adjacent neurovascular structures. The skin was then prepped in sterile fashion and the target structure was injected under direct visualization using sterile technique as below:  Single injection performed as below: PREP: Alcohol and Ethel Chloride APPROACH:superiolateral, single injection, 21g 2 in. INJECTATE: 5cc Zilretta (32mg /78mL Sustained Release Triamcinolone) ASPIRATE: None DRESSING: Band-Aid  Post procedural instructions including recommending icing and warning signs for infection were reviewed.    This procedure was well tolerated and there were no complications.   IMPRESSION: Succesful Ultrasound Guided: Injection

## 2018-04-26 NOTE — Progress Notes (Signed)
Kelly Frazier. Kelly Frazier, [place] at [place] - 72 y.o. female MRN [id number]  Date of birth: 1946-07-11  Visit Date: 04/26/2018  PCP: Marin Olp, MD   Referred by: Marin Olp, MD  Scribe(s) for today's visit: Josepha Pigg, CMA  SUBJECTIVE:  Kelly Frazier is here for No chief complaint on file.   11/13/17: Her L knee pain symptoms INITIALLY: Began approximately 1 month ago, no known injury but have been worsening progressively over the past several weeks.  Pain has worsened over the past 1 week after trying to walk up a flight of steps.  She denies any significant locking, clicking or giving way.  Anti-inflammatories only minimal minimally helpful.  Described as severe. nonradiating Worsened with Weightbearing in knee flexion/extension. Improved with resting Additional associated symptoms include: Pain is mostly posterior At this time symptoms are worsening compared to onset, was walking in the office today and felt her knee pop, she is now in a wheel chair.   11/27/17: Compared to the last office visit, her previously described symptoms are improving. She does report a sensation of tightness on the posterior aspect of her knee, like a rubber band is around the knee. This started about 4 days after her steroid injection. Pain is present when rolong over in bed and sitting, standing.  Current symptoms are mild & are radiating to posterior leg - about 3-4" above and below the knee.  Current therapies: She has been taking Advil with some relief. She has tried icing the knee with some relief.  She received steroid injection 11/13/16 and tolerated this well.   Pt c/o lower back pain which started 3-4 days ago. She was weeding her garden and strained her back. She reports having osteoarthritis and has chronic lower back pain which comes and goes.  Pain is mild currently but severe with twisting. Pain  radiates into the R leg.  Worsened with twisting, bending, turning. Improves with sitting, resting, lying down.  She denies loss of control of bladder or bowel functions.  She has tried taking Advil and using ice with some relief.   12/25/2017: Compared to the last office visit, her previously described symptoms are improving.  Current symptoms are moderate & are radiating to the anterior lower leg and posterior thigh.  She has been doing HEP sporadically. She feels that she responded well to the injection that she got 11/27/17. She still has a tightness across the back of her knee and it still feels unstable. Sometimes the pain is worse than others.  She has been icing her knee prn and taking Advil with some relief. Pt reports that back pain has resolved. She tried taking Baclofen a few times and got minimal relief.   03/01/2018: Compared to the last office visit, her previously described symptoms are improving. She is still aware of the knee pain and still feels that the L knee is unstable. She has noticed some swelling around the knee. Pain and swelling seems to be worse with prolonged time standing, using stairs, or repetitive bending of the L knee.  Current symptoms are mild & are radiating to the lower leg and thigh, anterior and posterior.  She has been icing the knee and taking Advil with some relief. She has also been using Voltaren gel with some relief.   04/26/2018: Compared to the last office visit, her previously described symptoms are worsening. She did a lot of walking  yesterday and noticed increased swelling in the knees.  Current symptoms are mild & are radiating to L upper leg.  She has been taking Advil, using Voltaren gel, and icing prn with some relief.  She would like Zilretta injection for her L knee today.  She also c/o LBP and possible splinter on the bottom of her L foot.    REVIEW OF SYSTEMS: Denies night time disturbances. Denies fevers, chills, or night  sweats. Denies unexplained weight loss. Denies personal history of cancer. Denies changes in bowel or bladder habits. Denies recent unreported falls. Denies new or worsening dyspnea or wheezing. Denies headaches or dizziness.  Denies numbness, tingling or weakness  In the extremities.  Denies dizziness or presyncopal episodes Denies lower extremity edema    HISTORY & PERTINENT PRIOR DATA:  Prior History reviewed and updated per electronic medical record.  Significant/pertinent history, findings, studies include:  reports that she has never smoked. She has never used smokeless tobacco. No results for input(s): HGBA1C, LABURIC, CREATINE in the last 8760 hours. 03/11/18: Zilretta - No PA required.  Covered at 80% w/ $20 office co-pay.  Once OOP Max of $3400 is met, then Zilretta covered at 100% and co-pay no longer applies. - MW No problems updated.  OBJECTIVE:  VS:  HT:5' 4"  (162.6 cm)   WT:187 lb 3.2 oz (84.9 kg)  BMI:32.12    BP:124/84  HR:66bpm  TEMP: ( )  RESP:95 %   PHYSICAL EXAM: Constitutional: WDWN, Non-toxic appearing. Psychiatric: Alert & appropriately interactive.  Not depressed or anxious appearing. Respiratory: No increased work of breathing.  Trachea Midline Eyes: Pupils are equal.  EOM intact without nystagmus.  No scleral icterus  Vascular Exam: warm to touch no edema  lower extremity neuro exam: unremarkable  MSK Exam: Generalized osteoarthritic bossing. No significant effusion.   PROCEDURES & DATA REVIEWED:  See note  ncidentally reported a very small splinter in the plantar aspect of her foot that was easily removed using blunt debridement with a 18-gauge needle.  ASSESSMENT & PLAN:   1. Primary osteoarthritis of both knees     PLAN: Zilretta injection performed today.   Follow-up: Return in about 13 weeks (around 07/26/2018).      Please see additional documentation for Objective, Assessment and Plan sections. Pertinent additional  documentation may be included in corresponding procedure notes, imaging studies, problem based documentation and patient instructions. Please see these sections of the encounter for additional information regarding this visit.  CMA/ATC served as Education administrator during this visit. History, Physical, and Plan performed by medical provider. Documentation and orders reviewed and attested to.      [name], [facility] Physician

## 2018-04-26 NOTE — Patient Instructions (Signed)

## 2018-06-02 ENCOUNTER — Ambulatory Visit
Admission: RE | Admit: 2018-06-02 | Discharge: 2018-06-02 | Disposition: A | Discharge status: Home / Self Care | Payer: PPO | Source: Ambulatory Visit | Attending: Family Medicine | Admitting: Family Medicine

## 2018-06-02 DIAGNOSIS — Z1231 Encounter for screening mammogram for malignant neoplasm of breast: Secondary | ICD-10-CM | POA: Diagnosis not present

## 2018-06-03 ENCOUNTER — Telehealth: Payer: Self-pay | Admitting: Family Medicine

## 2018-06-03 NOTE — Telephone Encounter (Signed)
Called patient and left a detailed voicemail message letting her know she isn't due a Tdap injection again until 2026

## 2018-06-03 NOTE — Telephone Encounter (Signed)
Copied from [place] [id number]. Topic: General - Other >> Jun 03, 2018  8:22 AM Cecelia Byars, NT wrote: Reason for CRM: The patient called and said she expecting a new grand baby  in February and would like to if she should  the get whooping cough injection ,and  if it is available ,please call at [phone]

## 2018-06-15 IMAGING — MG STEREOTACTIC CORE NEEDLE BIOPSY
3 series · 4 of 11 positions shown · non-contrast
Comparison: Previous exams.

ADDENDUM:
Pathology revealed BENIGN BREAST PARENCHYMA of the lower inner
quadrant of the Left breast. FIBROCYSTIC CHANGES WITH USUAL DUCTAL
HYPERPLASIA of the upper outer quadrant of the Left breast. This was
found to be concordant by Dr. Lorraine Jim. Pathology results were
discussed with the patient by telephone. The patient reported doing
well after the biopsies with tenderness and minimal bleeding at the
sites. Post biopsy instructions and care were reviewed and questions
were answered. The patient was encouraged to call The [REDACTED] for any additional concerns. The patient was
instructed to return for annual screening mammography.

Pathology results reported by Hou Ieong Quach Thi, RN on 03/19/2016.
CLINICAL DATA: Patient presents for stereotactic core needle biopsy
of 2 small masses in the left breast.
EXAM:
LEFT BREAST STEREOTACTIC CORE NEEDLE BIOPSY

[L CC]
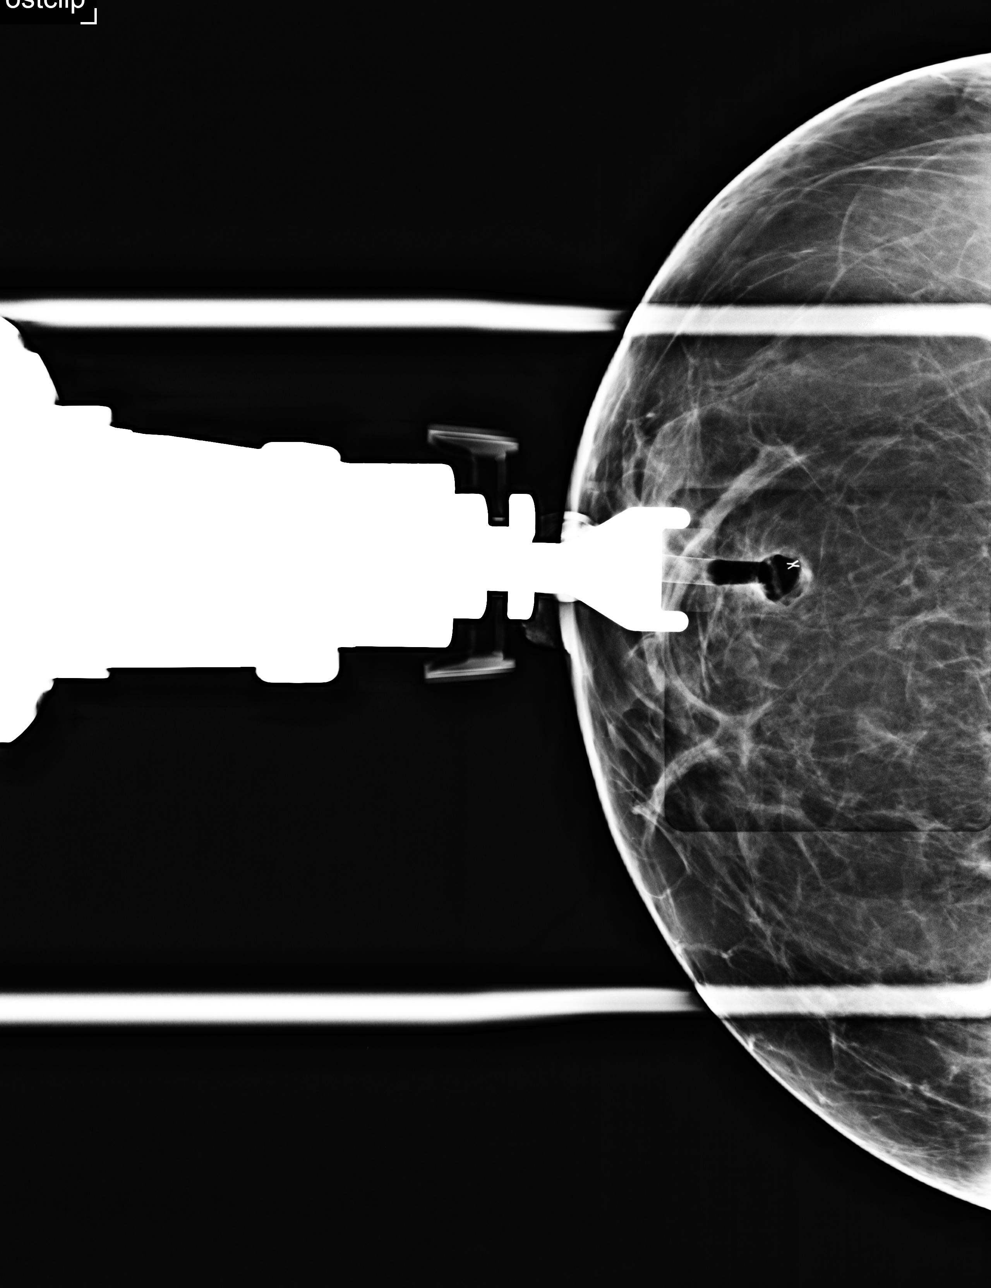

[L CC tomo · 2 of 69 frames shown (1 of 2)]
[frame 23/69]
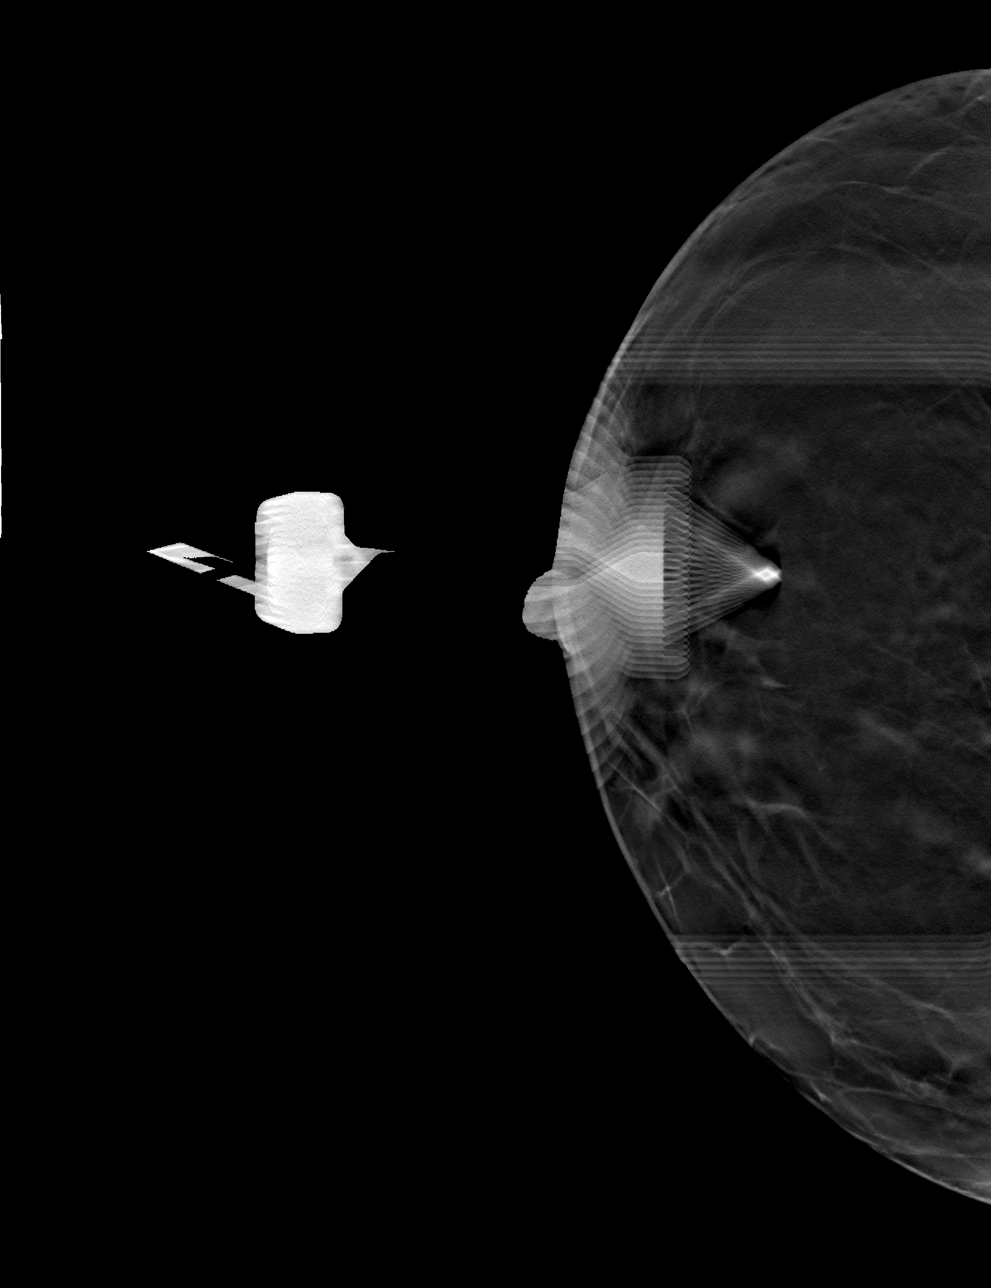
[frame 35/69]
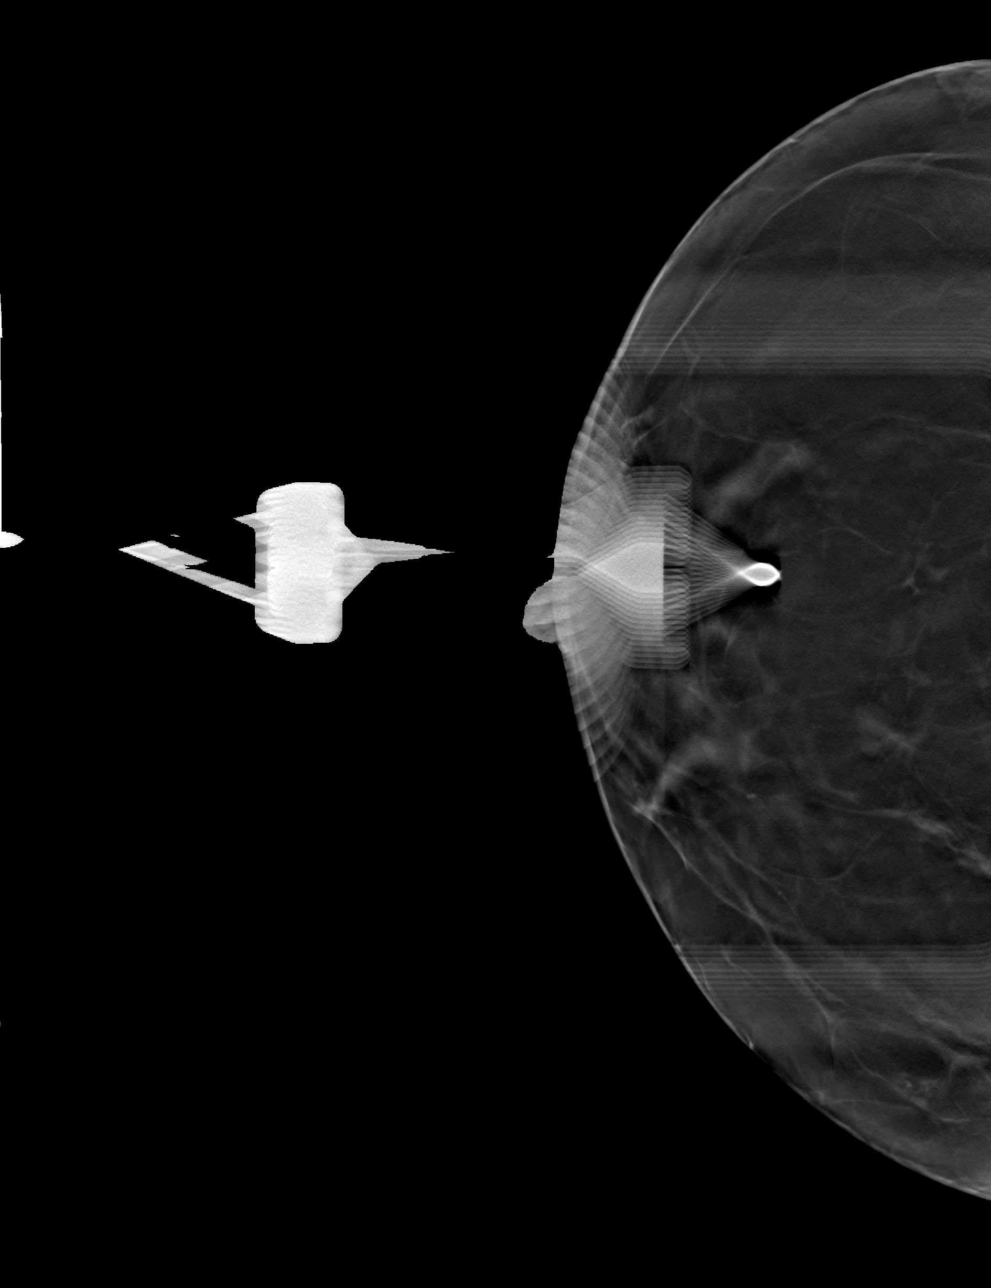

[L CC tomo (2 of 2) · tomo slice 35/69.0]
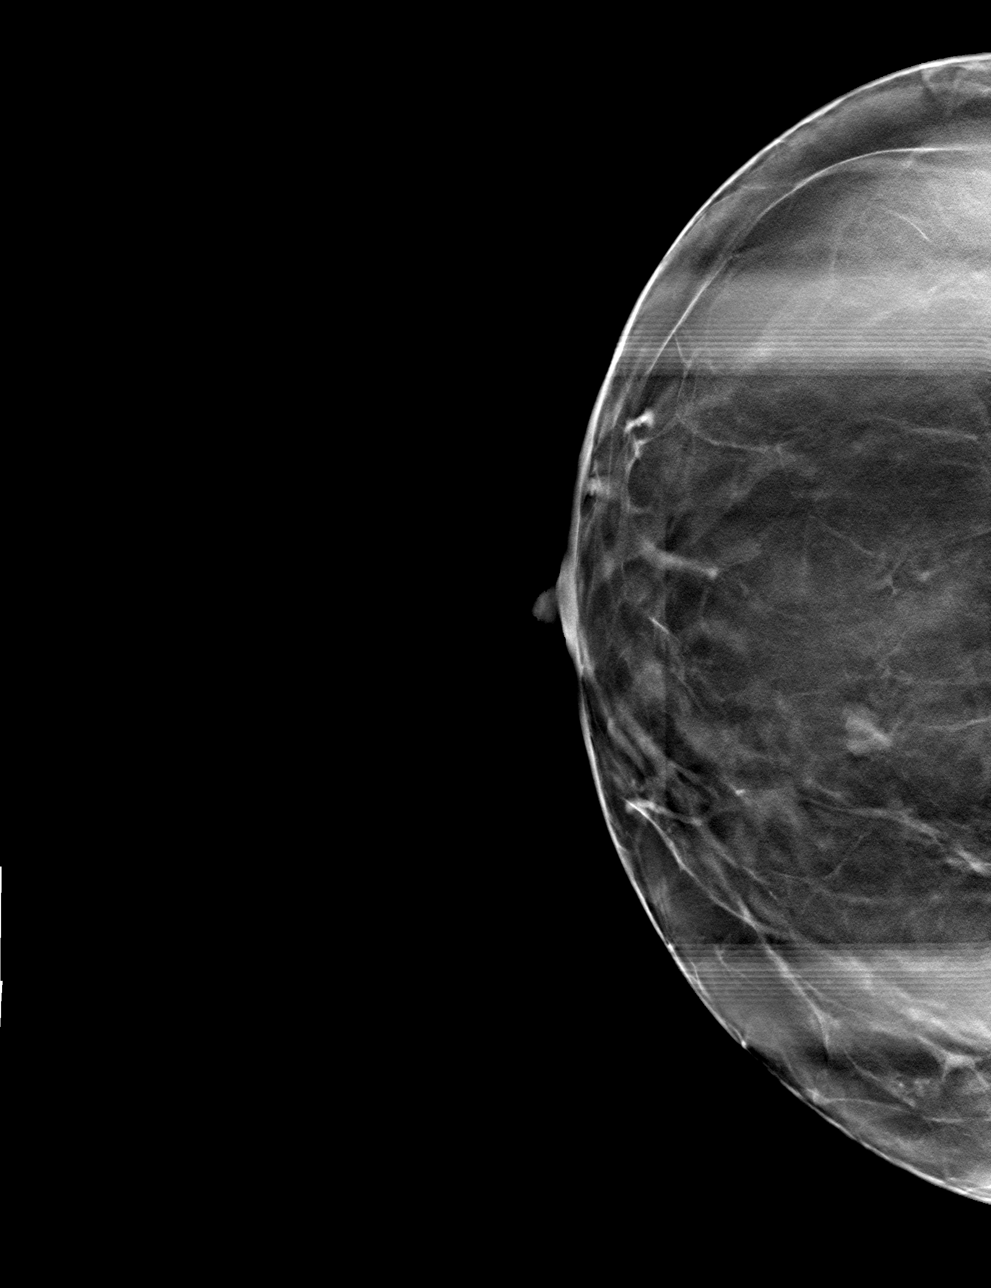

[4 of 11 positions shown; findings below may reference images not displayed]



Using sterile technique and 1% Lidocaine as local anesthetic, under
stereotactic guidance, a 9 gauge vacuum assist device was used to
perform core needle biopsy of the small mass located in the lower
inner quadrant of the left breast using a cranial to caudal
approach.

At the conclusion of the procedure, an X shaped tissue marker clip
was deployed into the biopsy cavity. Follow-up 2-view mammogram was
performed and dictated separately.

Using sterile technique and 1% Lidocaine as local anesthetic, under
stereotactic guidance, a 9 gauge vacuum assist device was used to
perform core needle biopsy of the small mass located in the upper
outer quadrant of the left breast using a cranial to caudal
approach.

At the conclusion of the procedure, a coil shaped tissue marker clip
was deployed into the biopsy cavity. Follow-up 2-view mammogram was
performed and dictated separately.
IMPRESSION: Stereotactic-guided biopsy of 2 left breast masses. No apparent
complications.

## 2018-06-15 IMAGING — MG MM BREAST BX W LOC DEV EA AD LESION IMG BX SPEC STEREO GUIDE*L*
3 series · 4 of 11 positions shown · non-contrast
Comparison: Previous exams.

ADDENDUM:
Pathology revealed BENIGN BREAST PARENCHYMA of the lower inner
quadrant of the Left breast. FIBROCYSTIC CHANGES WITH USUAL DUCTAL
HYPERPLASIA of the upper outer quadrant of the Left breast. This was
found to be concordant by Dr. Lorraine Jim. Pathology results were
discussed with the patient by telephone. The patient reported doing
well after the biopsies with tenderness and minimal bleeding at the
sites. Post biopsy instructions and care were reviewed and questions
were answered. The patient was encouraged to call The [REDACTED] for any additional concerns. The patient was
instructed to return for annual screening mammography.

Pathology results reported by Hou Ieong Quach Thi, RN on 03/19/2016.
CLINICAL DATA: Patient presents for stereotactic core needle biopsy
of 2 small masses in the left breast.
EXAM:
LEFT BREAST STEREOTACTIC CORE NEEDLE BIOPSY

[L CC]
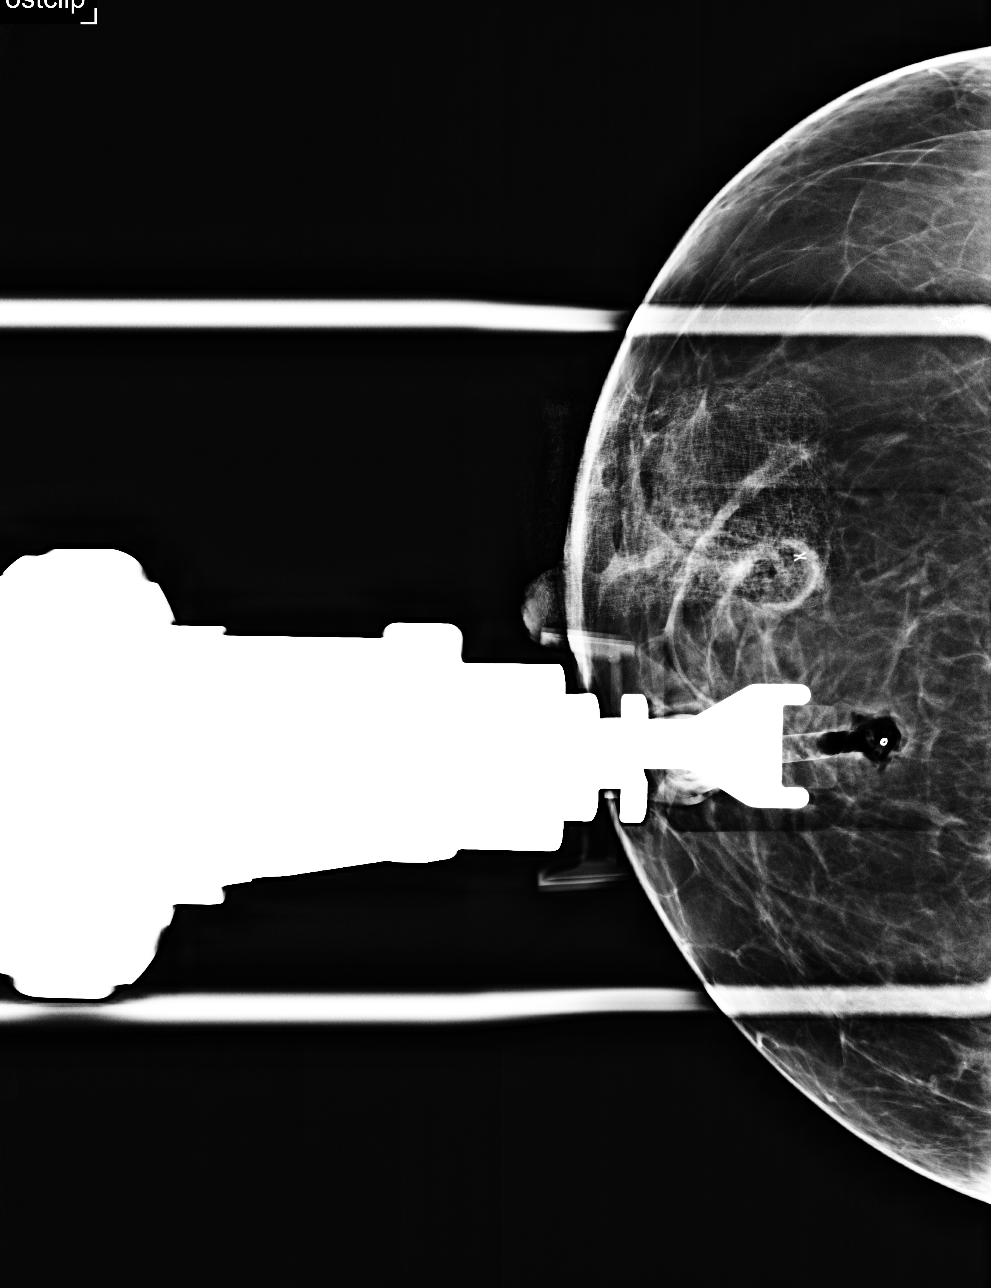

[L CC tomo · 2 of 69 frames shown (1 of 2)]
[frame 23/69]
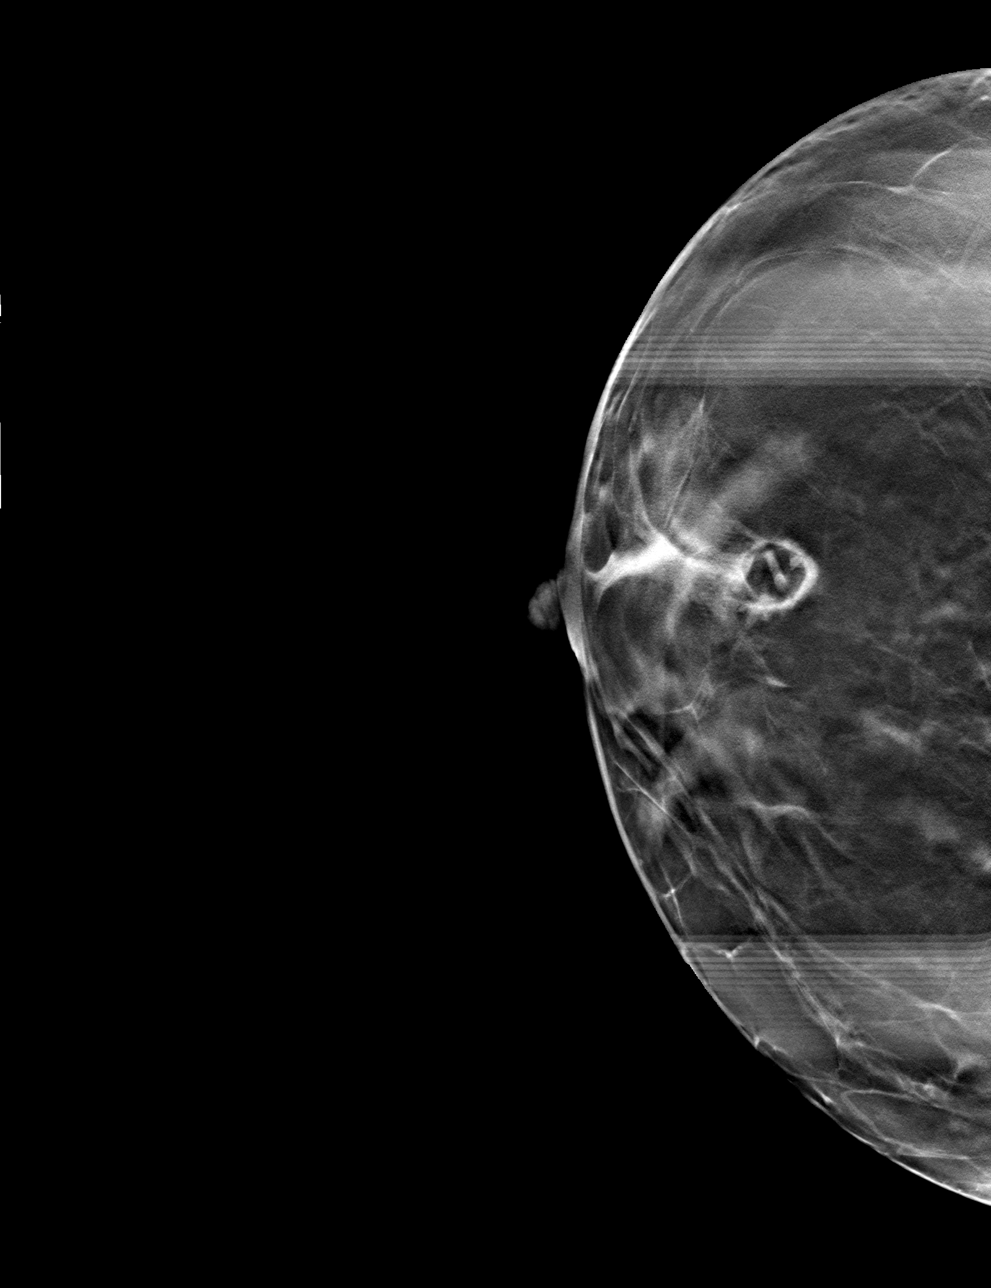
[frame 35/69]
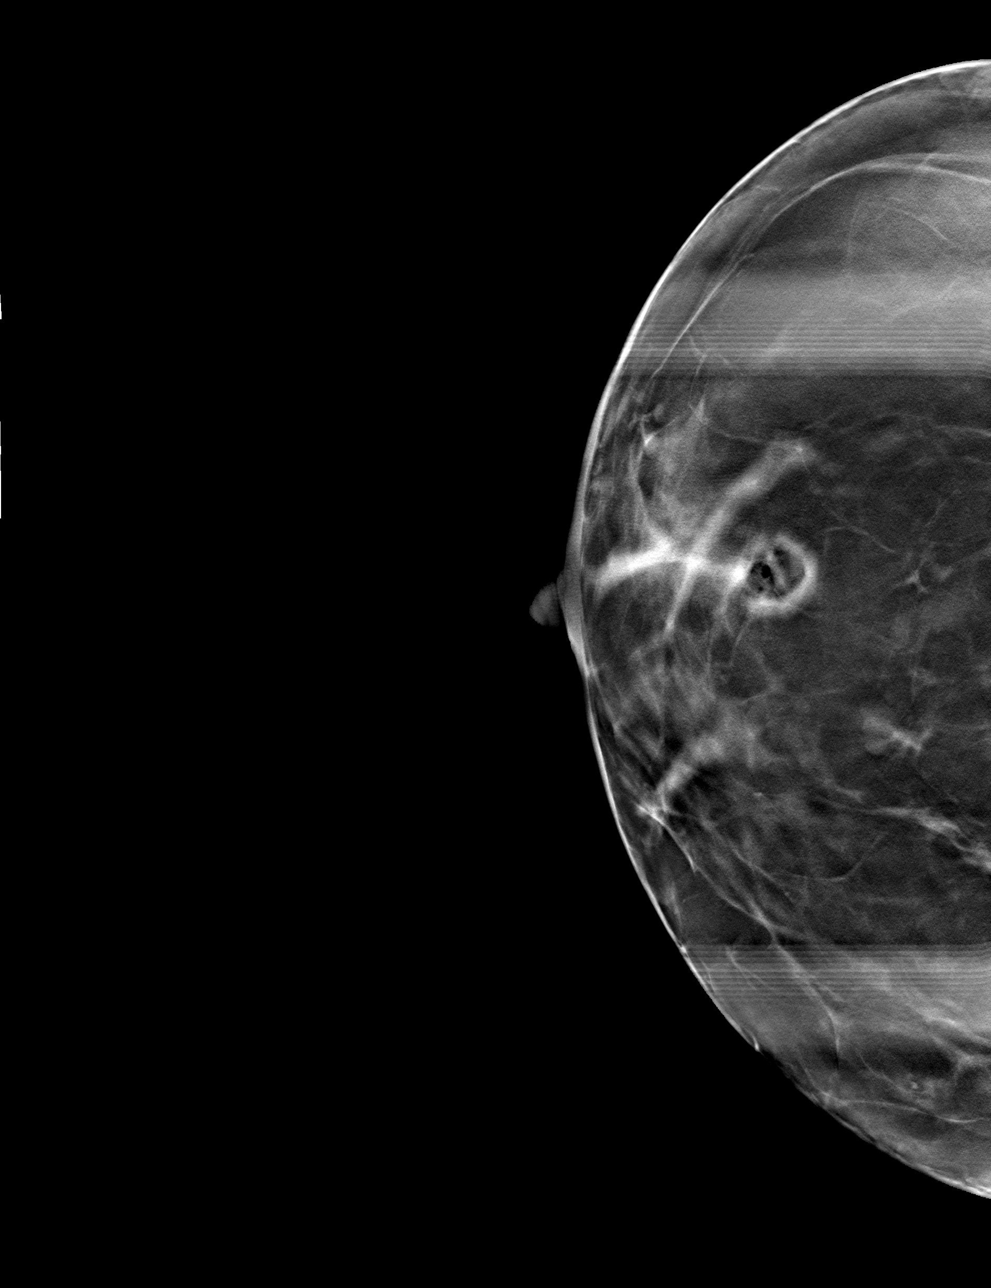

[L CC tomo (2 of 2) · tomo slice 35/69.0]
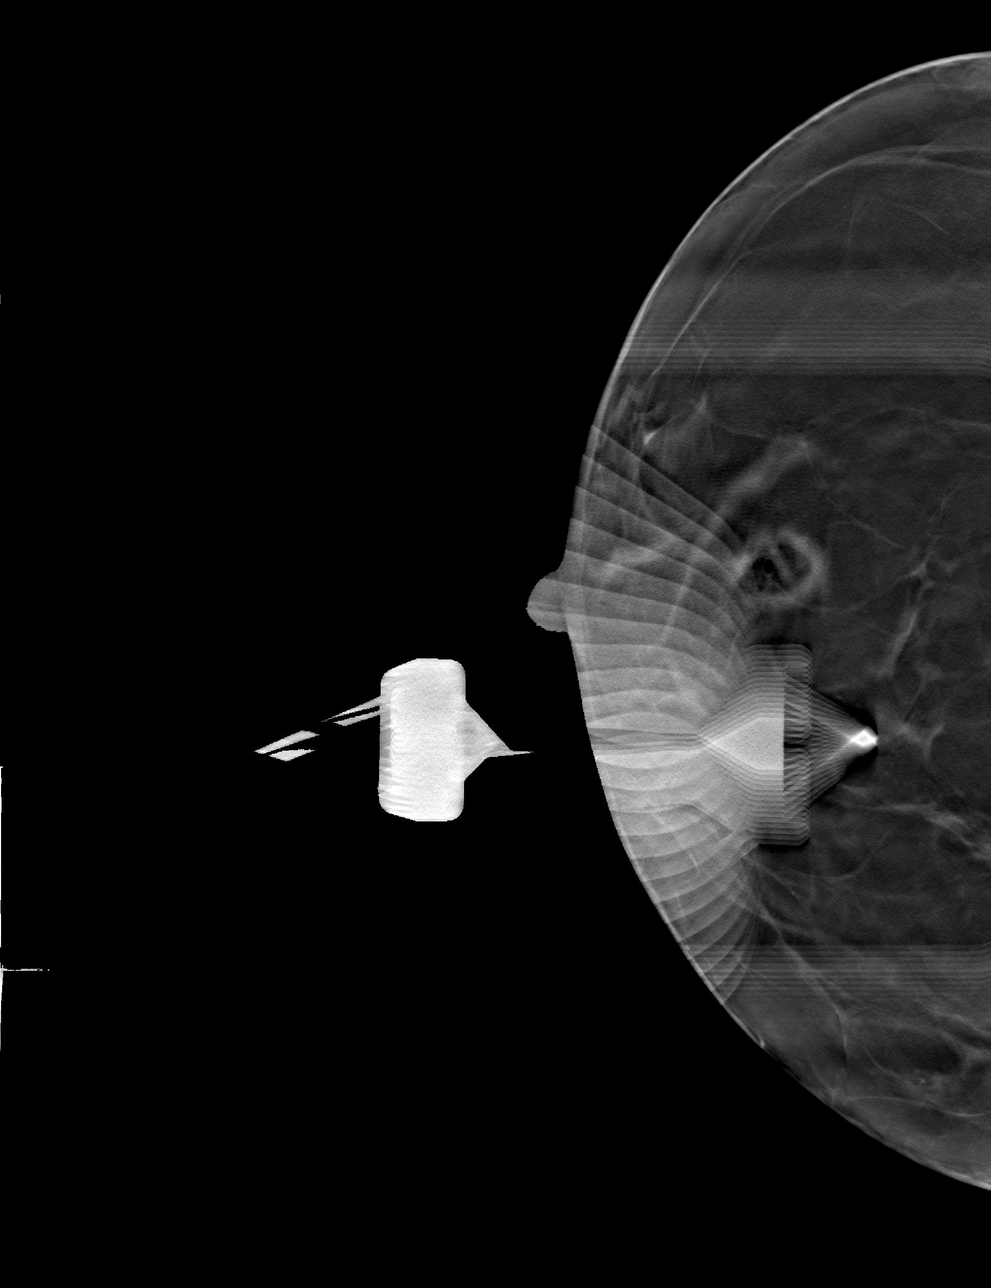

[4 of 11 positions shown; findings below may reference images not displayed]



Using sterile technique and 1% Lidocaine as local anesthetic, under
stereotactic guidance, a 9 gauge vacuum assist device was used to
perform core needle biopsy of the small mass located in the lower
inner quadrant of the left breast using a cranial to caudal
approach.

At the conclusion of the procedure, an X shaped tissue marker clip
was deployed into the biopsy cavity. Follow-up 2-view mammogram was
performed and dictated separately.

Using sterile technique and 1% Lidocaine as local anesthetic, under
stereotactic guidance, a 9 gauge vacuum assist device was used to
perform core needle biopsy of the small mass located in the upper
outer quadrant of the left breast using a cranial to caudal
approach.

At the conclusion of the procedure, a coil shaped tissue marker clip
was deployed into the biopsy cavity. Follow-up 2-view mammogram was
performed and dictated separately.
IMPRESSION: Stereotactic-guided biopsy of 2 left breast masses. No apparent
complications.

## 2018-07-13 DIAGNOSIS — Z23 Encounter for immunization: Secondary | ICD-10-CM | POA: Diagnosis not present

## 2018-07-26 ENCOUNTER — Encounter: Payer: Self-pay | Admitting: Sports Medicine

## 2018-07-26 ENCOUNTER — Ambulatory Visit (INDEPENDENT_AMBULATORY_CARE_PROVIDER_SITE_OTHER): Payer: Medicare Other | Admitting: Sports Medicine

## 2018-07-26 VITALS — BP 124/82 | HR 68 | Ht 64.0 in | Wt 183.8 lb

## 2018-07-26 DIAGNOSIS — M25562 Pain in left knee: Secondary | ICD-10-CM | POA: Diagnosis not present

## 2018-07-26 DIAGNOSIS — M25561 Pain in right knee: Secondary | ICD-10-CM

## 2018-07-26 DIAGNOSIS — M170 Bilateral primary osteoarthritis of knee: Secondary | ICD-10-CM

## 2018-07-26 NOTE — Progress Notes (Signed)
Kelly Frazier. Kelly Frazier, [place] at [place] - 72 y.o. female MRN [id number]  Date of birth: Oct 09, 1945  Visit Date: 07/26/2018  PCP: Marin Olp, MD   Referred by: Marin Olp, MD  Scribe(s) for today's visit: Josepha Pigg, CMA  SUBJECTIVE:  Kelly Frazier is here for Follow-up (osteoarthritis of both knees)   HPI 11/13/17: Her L knee pain symptoms INITIALLY: Began approximately 1 month ago, no known injury but have been worsening progressively over the past several weeks.  Pain has worsened over the past 1 week after trying to walk up a flight of steps.  She denies any significant locking, clicking or giving way.  Anti-inflammatories only minimal minimally helpful.  Described as severe. nonradiating Worsened with Weightbearing in knee flexion/extension. Improved with resting Additional associated symptoms include: Pain is mostly posterior At this time symptoms are worsening compared to onset, was walking in the office today and felt her knee pop, she is now in a wheel chair.   11/27/17: Compared to the last office visit, her previously described symptoms are improving. She does report a sensation of tightness on the posterior aspect of her knee, like a rubber band is around the knee. This started about 4 days after her steroid injection. Pain is present when rolong over in bed and sitting, standing.  Current symptoms are mild & are radiating to posterior leg - about 3-4" above and below the knee.  Current therapies: She has been taking Advil with some relief. She has tried icing the knee with some relief.  She received steroid injection 11/13/16 and tolerated this well.   Pt c/o lower back pain which started 3-4 days ago. She was weeding her garden and strained her back. She reports having osteoarthritis and has chronic lower back pain which comes and goes.  Pain is mild currently but severe with  twisting. Pain radiates into the R leg.  Worsened with twisting, bending, turning. Improves with sitting, resting, lying down.  She denies loss of control of bladder or bowel functions.  She has tried taking Advil and using ice with some relief.   12/25/2017: Compared to the last office visit, her previously described symptoms are improving.  Current symptoms are moderate & are radiating to the anterior lower leg and posterior thigh.  She has been doing HEP sporadically. She feels that she responded well to the injection that she got 11/27/17. She still has a tightness across the back of her knee and it still feels unstable. Sometimes the pain is worse than others.  She has been icing her knee prn and taking Advil with some relief. Pt reports that back pain has resolved. She tried taking Baclofen a few times and got minimal relief.   03/01/2018: Compared to the last office visit, her previously described symptoms are improving. She is still aware of the knee pain and still feels that the L knee is unstable. She has noticed some swelling around the knee. Pain and swelling seems to be worse with prolonged time standing, using stairs, or repetitive bending of the L knee.  Current symptoms are mild & are radiating to the lower leg and thigh, anterior and posterior.  She has been icing the knee and taking Advil with some relief. She has also been using Voltaren gel with some relief.   04/26/2018: Compared to the last office visit, her previously described symptoms are worsening. She did a lot of  walking yesterday and noticed increased swelling in the knees.  Current symptoms are mild & are radiating to L upper leg.  She has been taking Advil, using Voltaren gel, and icing prn with some relief.  She would like Zilretta injection for her L knee today.  She also c/o LBP and possible splinter on the bottom of her L foot.   07/26/2018: Compared to the last office visit, her previously described symptoms  are improving. She reports a few mild twinges in the knee over the past couple of weeks. Current symptoms are mild & are non-radiating. She takes Advil rarely, mostly when being more active. She has used Voltaren sporadically.   She notes episode of dizziness yesterday morning that lasted for 30-45 min, which is unusual for her. Sx worse with turning.    REVIEW OF SYSTEMS: Denies night time disturbances. Denies fevers, chills, or night sweats. Denies unexplained weight loss. Denies personal history of cancer. Denies changes in bowel or bladder habits. Denies recent unreported falls. Denies new or worsening dyspnea or wheezing. Denies headaches or dizziness.  Denies numbness, tingling or weakness  In the extremities.  Denies dizziness or presyncopal episodes Denies lower extremity edema    HISTORY:  Prior history reviewed and updated per electronic medical record.  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 Smoking status: Never Smoker  . Smokeless tobacco: Never Used  Substance and Sexual Activity  . Alcohol use: Yes    Alcohol/week: 2.0 standard drinks    Types: 2 Glasses of wine per week  . Drug use: No  . Sexual activity: Yes   Social History   Social History Narrative   Divorced in 53s. 1 son. 2 twin grandkids age 2 in 35- in [place].       Retired from [employer].     DATA OBTAINED & REVIEWED:  No results for input(s): HGBA1C, LABURIC, CREATINE in the last 8760 hours. No problems updated. . 11/13/2017: X-rays bilateral knees show mild osteoarthritis particular the patellofemoral joint.  OBJECTIVE:  VS:  HT:5\' 4"  (162.6 cm)   WT:183 lb 12.8 oz (83.4 kg)  BMI:31.53    BP:124/82  HR:68bpm  TEMP: ( )  RESP:94 %   PHYSICAL EXAM: CONSTITUTIONAL: Well-developed, Well-nourished and In no acute distress PSYCHIATRIC: Alert & appropriately interactive. and Not depressed or anxious  appearing. RESPIRATORY: No increased work of breathing and Trachea Midline EYES: Pupils are equal., EOM intact without nystagmus. and No scleral icterus.  VASCULAR EXAM: Warm and well perfused NEURO: unremarkable  MSK Exam: Bilateral knee  Well aligned, no significant deformity. No overlying skin changes. No focal bony tenderness   RANGE OF MOTION & STRENGTH  Normal, non-painful Flexion and extension of bilateral knees with full range of motion   SPECIALITY TESTING:  no medial joint line pain.  Negative McMurray's.     ASSESSMENT   1. Primary osteoarthritis of both knees   2. Arthralgia of both knees     PLAN:  Pertinent additional documentation may be included in corresponding procedure notes, imaging studies, problem based documentation and patient instructions.  Procedures:  . None  Medications:  No orders of the defined types were placed in this encounter.  Discussion/Instructions: No problem-specific Assessment & Plan notes found for this encounter.  . Has done well with injections.  She will follow-up as needed for repeat Zilretta . Continue previously prescribed home exercise program.  .  Discussed red flag symptoms that warrant earlier emergent evaluation and patient voices understanding. . Activity modifications and the importance of avoiding exacerbating activities (limiting pain to no more than a 4 / 10 during or following activity) recommended and discussed.  Follow-up:  . Return if symptoms worsen or fail to improve, for consideration of repeat injections.  .  .      CMA/ATC served as Education administrator during this visit. History, Physical, and Plan performed by medical provider. Documentation and orders reviewed and attested to.      [name], [facility] Physician

## 2018-08-19 ENCOUNTER — Ambulatory Visit: Payer: PPO | Admitting: Family Medicine

## 2018-08-19 ENCOUNTER — Ambulatory Visit: Payer: Medicare Other | Admitting: Sports Medicine

## 2018-08-19 ENCOUNTER — Ambulatory Visit: Payer: Medicare Other

## 2018-08-20 ENCOUNTER — Encounter: Payer: Self-pay | Admitting: Sports Medicine

## 2018-08-20 ENCOUNTER — Ambulatory Visit: Payer: Self-pay

## 2018-08-20 ENCOUNTER — Ambulatory Visit (INDEPENDENT_AMBULATORY_CARE_PROVIDER_SITE_OTHER): Payer: Medicare Other | Admitting: Sports Medicine

## 2018-08-20 VITALS — BP 134/84 | HR 58 | Ht 64.0 in | Wt 183.2 lb

## 2018-08-20 DIAGNOSIS — M170 Bilateral primary osteoarthritis of knee: Secondary | ICD-10-CM

## 2018-08-20 DIAGNOSIS — M180 Bilateral primary osteoarthritis of first carpometacarpal joints: Secondary | ICD-10-CM | POA: Diagnosis not present

## 2018-08-20 MED ORDER — DICLOFENAC SODIUM 1 % TD GEL: TRANSDERMAL | 1 refills | Status: DC

## 2018-08-20 NOTE — Procedures (Signed)
PROCEDURE NOTE:  Ultrasound Guided: Injection: Left knee, Intra-articular Images were obtained and interpreted by myself, Teresa Coombs, DO  Images have been saved and stored to PACS system. Images obtained on: GE S7 Ultrasound machine    ULTRASOUND FINDINGS:  There are degenerative changes of both CMC joints that were appreciated on ultrasound today with moderate degree of synovitis.  This was done in addition to the injection. The knee has minimal effusion with only a small amount of synovitis.  DESCRIPTION OF PROCEDURE:  The patient's clinical condition is marked by substantial pain and/or significant functional disability. Other conservative therapy has not provided relief, is contraindicated, or not appropriate. There is a reasonable likelihood that injection will significantly improve the patient's pain and/or functional impairment.   After discussing the risks, benefits and expected outcomes of the injection and all questions were reviewed and answered, the patient wished to undergo the above named procedure.  Verbal consent was obtained.  The ultrasound was used to identify the target structure and adjacent neurovascular structures. The skin was then prepped in sterile fashion and the target structure was injected under direct visualization using sterile technique as below:  Single injection performed as below: PREP: Alcohol and Ethel Chloride APPROACH:superiolateral, single injection, 21g 2 in. INJECTATE: 5cc Zilretta (32mg /40mL Sustained Release Triamcinolone) ASPIRATE: None DRESSING: Band-Aid  Post procedural instructions including recommending icing and warning signs for infection were reviewed.    This procedure was well tolerated and there were no complications.   IMPRESSION: Succesful Ultrasound Guided: Injection

## 2018-08-20 NOTE — Patient Instructions (Addendum)

## 2018-08-25 ENCOUNTER — Ambulatory Visit (INDEPENDENT_AMBULATORY_CARE_PROVIDER_SITE_OTHER): Payer: Medicare Other

## 2018-08-25 VITALS — BP 118/72 | HR 67 | Ht 64.0 in | Wt 180.8 lb

## 2018-08-25 DIAGNOSIS — Z0000 Encounter for general adult medical examination without abnormal findings: Secondary | ICD-10-CM

## 2018-08-25 NOTE — Patient Instructions (Addendum)
Kelly Frazier , Thank you for taking time to come for your Medicare Wellness Visit. I appreciate your ongoing commitment to your health goals. Please review the following plan we discussed and let me know if I can assist you in the future.   These are the goals we discussed: Goals    . Exercise 4 days/week       This is a list of the screening recommended for you and due dates:  Health Maintenance  Topic Date Due  . Mammogram  06/02/2020  . Colon Cancer Screening  06/29/2022  . Tetanus Vaccine  11/23/2024  . Flu Shot  Completed  . DEXA scan (bone density measurement)  Completed  .  Hepatitis C: One time screening is recommended by Center for Disease Control  (CDC) for  adults born from [birth year] through 1963.   Completed  . Pneumonia vaccines  Completed   Preventive Care for Adults  A healthy lifestyle and preventive care can promote health and wellness. Preventive health guidelines for adults include the following key practices.  . A routine yearly physical is a good way to check with your health care provider about your health and preventive screening. It is a chance to share any concerns and updates on your health and to receive a thorough exam.  . Visit your dentist for a routine exam and preventive care every 6 months. Brush your teeth twice a day and floss once a day. Good oral hygiene prevents tooth decay and gum disease.  . The frequency of eye exams is based on your age, health, family medical history, use  of contact lenses, and other factors. Follow your health care provider's recommendations for frequency of eye exams.  . Eat a healthy diet. Foods like vegetables, fruits, whole grains, low-fat dairy products, and lean protein foods contain the nutrients you need without too many calories. Decrease your intake of foods high in solid fats, added sugars, and salt. Eat the right amount of calories for you. Get information about a proper diet from your health care provider, if  necessary.  . Regular physical exercise is one of the most important things you can do for your health. Most adults should get at least 150 minutes of moderate-intensity exercise (any activity that increases your heart rate and causes you to sweat) each week. In addition, most adults need muscle-strengthening exercises on 2 or more days a week.  Silver Sneakers may be a benefit available to you. To determine eligibility, you may visit the website: www.silversneakers.com or contact program at (607)182-1799 Mon-Fri between 8AM-8PM.   . Maintain a healthy weight. The body mass index (BMI) is a screening tool to identify possible weight problems. It provides an estimate of body fat based on height and weight. Your health care provider can find your BMI and can help you achieve or maintain a healthy weight.   For adults 20 years and older: ? A BMI below 18.5 is considered underweight. ? A BMI of 18.5 to 24.9 is normal. ? A BMI of 25 to 29.9 is considered overweight. ? A BMI of 30 and above is considered obese.   . Maintain normal blood lipids and cholesterol levels by exercising and minimizing your intake of saturated fat. Eat a balanced diet with plenty of fruit and vegetables. Blood tests for lipids and cholesterol should begin at age 53 and be repeated every 5 years. If your lipid or cholesterol levels are high, you are over 50, or you are at high risk for  heart disease, you may need your cholesterol levels checked more frequently. Ongoing high lipid and cholesterol levels should be treated with medicines if diet and exercise are not working.  . If you smoke, find out from your health care provider how to quit. If you do not use tobacco, please do not start.  . If you choose to drink alcohol, please do not consume more than 2 drinks per day. One drink is considered to be 12 ounces (355 mL) of beer, 5 ounces (148 mL) of wine, or 1.5 ounces (44 mL) of liquor.  . If you are 47-12 years old, ask your  health care provider if you should take aspirin to prevent strokes.  . Use sunscreen. Apply sunscreen liberally and repeatedly throughout the day. You should seek shade when your shadow is shorter than you. Protect yourself by wearing long sleeves, pants, a wide-brimmed hat, and sunglasses year round, whenever you are outdoors.  . Once a month, do a whole body skin exam, using a mirror to look at the skin on your back. Tell your health care provider of new moles, moles that have irregular borders, moles that are larger than a pencil eraser, or moles that have changed in shape or color.

## 2018-08-25 NOTE — Progress Notes (Signed)
I have reviewed and agree with note, evaluation, plan.   Anissa Abbs, MD  

## 2018-08-25 NOTE — Progress Notes (Signed)
Subjective:   Kelly Frazier is a 72 y.o. female who presents for Medicare Annual (Subsequent) preventive examination.  Review of Systems:  No ROS.  Medicare Wellness Visit. Additional risk factors are reflected in the social history. Cardiac Risk Factors include: advanced age (>85men, >56 women) Patient currently lives in a single story home by herself. She fosters pets occasionally. Patient enjoys volunteering with the [employer] and the [employer] party. Women's education is important to her. Sh enjoys gardening and cooking. She has a group of friends she enjoys hanging out with.  She loves protest and "Moral Mondays". 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 She gets up at least 2 times a night to go to the bathroom. She gets up around 8-10am.    Objective:     Vitals: BP 118/72 (BP Location: Left Arm, Patient Position: Sitting, Cuff Size: Large)   Pulse 67   Ht 5\' 4"  (1.626 m)   Wt 180 lb 12.8 oz (82 kg)   SpO2 95%   BMI 31.03 kg/m   Body mass index is 31.03 kg/m.  Advanced Directives 08/25/2018 08/18/2017 06/29/2017 06/15/2017 01/13/2017 08/03/2016  Does Patient Have a Medical Advance Directive? No Yes No No No No  Type of Advance Directive - Healthcare Power of [place];Living will - - - -  Does patient want to make changes to medical advance directive? - No - Patient declined - - - -  Copy of [place] in Chart? - No - copy requested - - - -  Would patient like information on creating a medical advance directive? No - Patient declined - - - No - Patient declined No - patient declined information    Tobacco Social History   Tobacco Use  Smoking Status Never Smoker  Smokeless Tobacco Never Used     Counseling given: Not Answered    Past Medical History:  Diagnosis Date  . Allergy   . Arthritis    OA  . Cancer ([place]) 1980's   basal cell carcinoma  . Cataract    bilateral 2 yrs ago   .  Diverticulosis   . Excessive daytime sleepiness 06/05/2016  . GERD (gastroesophageal reflux disease)   . Head injury due to trauma    s/p attack - some memory loss   . Headache(784.0)   . Hx of colonic polyps   . Hyperlipidemia    no meds   . Hypertension    pt denies- on  meds   . Thyroid disease    Past Surgical History:  Procedure Laterality Date  . AXILLARY ABCESS IRRIGATION AND DEBRIDEMENT     cyst  . bcca from eyelid     basal cell 1980s.   Marland Kitchen BREAST BIOPSY  2006   and 2017  . CATARACT EXTRACTION, BILATERAL    . COLONOSCOPY  08/17/06  . NSVD  1971   x1  . osseous implant    . POLYPECTOMY    . removal of moles and denmatoid     Family History  Problem Relation Age of Onset  . Cancer Mother        lung- nonsmoker. in her 67s, perhaps related to job  . Hypertension Mother   . Lung cancer Mother   . Liver disease Father   . Alcohol abuse Father   . Hypertension Father   . Ovarian cancer Maternal Grandmother   . Hodgkin's lymphoma Maternal Grandfather   . Dementia  Paternal Grandmother   . Tuberculosis Paternal Grandfather   . Colon cancer Maternal Aunt   . Colon cancer Maternal Uncle   . Colon polyps Neg Hx   . Esophageal cancer Neg Hx   . Rectal cancer Neg Hx   . Stomach cancer Neg Hx    Social History   Socioeconomic History  . Marital status: Single    Spouse name: Not on file  . Number of children: Not on file  . Years of education: Not on file  . 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 Financial resource strain: Not on file  . Food insecurity:    Worry: Not on file    Inability: Not on file  . Transportation needs:    Medical: Not on file    Non-medical: Not on file  Tobacco Use  . Smoking status: Never Smoker  . Smokeless tobacco: Never Used  Substance and Sexual Activity  . Alcohol use: Yes    Alcohol/week: 2.0 standard drinks    Types: 2  Glasses of wine per week  . Drug use: No  . Sexual activity: Yes  Lifestyle  . Physical activity:    Days per week: Not on file    Minutes per session: Not on file  . Stress: Not on file  Relationships  . Social connections:    Talks on phone: Not on file    Gets together: Not on file    Attends religious service: Not on file    Active member of club or organization: Not on file    Attends meetings of clubs or organizations: Not on file    Relationship status: Not on file  Other Topics Concern  . Not on file  Social History Narrative   Divorced in 28s. 1 son. 2 twin grandkids age 55 in 16- in [place].       Retired from [employer].     Outpatient Encounter Medications as of 08/25/2018  Medication Sig  . atorvastatin (LIPITOR) 10 MG tablet Take 1 tablet (10 mg total) by mouth daily.  . bisoprolol-hydrochlorothiazide (ZIAC) 2.5-6.25 MG tablet Take 1 tablet by mouth daily.  . clobetasol ointment (TEMOVATE) 0.05 % Apply twice daily to vulva externally  . diclofenac sodium (VOLTAREN) 1 % GEL Apply topically to affected area qid  . EPINEPHrine (EPIPEN JR) 0.15 MG/0.3ML injection Inject 0.15 mg into the muscle as needed.    Marland Kitchen ibuprofen (ADVIL,MOTRIN) 200 MG tablet Take 200 mg by mouth every 6 (six) hours as needed for headache, mild pain or cramping.   Marland Kitchen levothyroxine (SYNTHROID, LEVOTHROID) 75 MCG tablet Take 1 tablet (75 mcg total) by mouth daily.  . Multiple Vitamins-Minerals (WOMENS 50+ ADVANCED PO) Take 2 tablets by mouth daily.  Marland Kitchen omeprazole (PRILOSEC) 20 MG capsule Take 1 capsule (20 mg total) by mouth daily.  Marland Kitchen oxybutynin (DITROPAN-XL) 5 MG 24 hr tablet Take by mouth.   No facility-administered encounter medications on file as of 08/25/2018.     Activities of Daily Living In your present state of health, do you have any difficulty performing the following activities: 08/25/2018  Hearing? N  Vision? N  Difficulty concentrating or making decisions? N  Walking or climbing stairs?  N  Dressing or bathing? N  Doing errands, shopping? N  Preparing Food and eating ? N  Using the Toilet? N  In the past six months,  have you accidently leaked urine? Y  Comment Takes Oxybutin and fells this has helped. Prescribed by OB/GYN  Do you have problems with loss of bowel control? N  Managing your Medications? N  Managing your Finances? N  Housekeeping or managing your Housekeeping? N  Some recent data might be hidden    Patient Care Team: Marin Olp, MD as PCP - General (Family Medicine) Calvert Cantor, MD as Consulting Physician (Ophthalmology)    Assessment:   This is a routine wellness examination for [place].  Exercise Activities and Dietary recommendations Current Exercise Habits: The patient does not participate in regular exercise at present([place] being followed by Dr. Paulla Fore currently), Exercise limited by: Other - see comments   Breakfast: normally skips breakfast, maybe some fruit or a fried egg, egg on toast, occasionally drinks coffee  Lunch:frequently combines with dinner, vegetable soup, meat and vegetables, goes out a lot at night for dinner to events, occasionally white wine, a glass of water  Somewhat of a snacker Goals    . Exercise 4 days/week       Fall Risk Fall Risk  08/25/2018 08/18/2017 08/03/2017 02/21/2016 11/24/2014  Falls in the past year? 0 No No No No   Depression Screen PHQ 2/9 Scores 08/25/2018 08/18/2017 08/03/2017 02/21/2016  PHQ - 2 Score 0 0 0 0  PHQ- 9 Score - 2 - -             Immunization History  Administered Date(s) Administered  . Influenza Split 06/23/2011  . Influenza Whole 08/31/2007, 06/27/2008, 06/21/2009, 06/21/2010, 07/31/2010  . Influenza, High Dose Seasonal PF 06/16/2016, 06/11/2017, 07/13/2018  . Influenza,inj,Quad PF,6+ Mos 06/14/2013, 06/28/2014, 06/28/2015  . Pneumococcal Conjugate-13 02/21/2016  . Pneumococcal Polysaccharide-23 09/14/2013  . Td 09/15/2005, 11/24/2014  . Zoster 12/31/2007  .  Zoster Recombinat (Shingrix) 06/14/2018      Screening Tests Health Maintenance  Topic Date Due  . MAMMOGRAM  06/02/2020  . COLONOSCOPY  06/29/2022  . TETANUS/TDAP  11/23/2024  . INFLUENZA VACCINE  Completed  . DEXA SCAN  Completed  . Hepatitis C Screening  Completed  . PNA vac Low Risk Adult  Completed         Plan:   Follow Up with PCP as advised   I have personally reviewed and noted the following in the patient's chart:   . Medical and social history . Use of alcohol, tobacco or illicit drugs  . Current medications and supplements . Functional ability and status . Nutritional status . Physical activity . Advanced directives . List of other physicians . Vitals . Screenings to include cognitive, depression, and falls . Referrals and appointments  In addition, I have reviewed and discussed with patient certain preventive protocols, quality metrics, and best practice recommendations. A written personalized care plan for preventive services as well as general preventive health recommendations were provided to patient.     [place], [place]  78/24/2353

## 2018-08-25 NOTE — Progress Notes (Signed)
PCP notes:03/01/2018   Health maintenance:Up to Date   Abnormal screenings: None   Patient concerns: None   Nurse concerns:None   Next PCP appt: 09/22/2018

## 2018-09-22 ENCOUNTER — Encounter: Payer: Self-pay | Admitting: Family Medicine

## 2018-09-22 ENCOUNTER — Ambulatory Visit (INDEPENDENT_AMBULATORY_CARE_PROVIDER_SITE_OTHER): Payer: Medicare Other | Admitting: Family Medicine

## 2018-09-22 ENCOUNTER — Encounter

## 2018-09-22 VITALS — BP 98/72 | HR 49 | Temp 97.6°F | Ht 64.0 in | Wt 183.8 lb

## 2018-09-22 DIAGNOSIS — Z79899 Other long term (current) drug therapy: Secondary | ICD-10-CM

## 2018-09-22 DIAGNOSIS — I10 Essential (primary) hypertension: Secondary | ICD-10-CM | POA: Diagnosis not present

## 2018-09-22 DIAGNOSIS — K219 Gastro-esophageal reflux disease without esophagitis: Secondary | ICD-10-CM

## 2018-09-22 DIAGNOSIS — E039 Hypothyroidism, unspecified: Secondary | ICD-10-CM

## 2018-09-22 DIAGNOSIS — E538 Deficiency of other specified B group vitamins: Secondary | ICD-10-CM | POA: Insufficient documentation

## 2018-09-22 DIAGNOSIS — E785 Hyperlipidemia, unspecified: Secondary | ICD-10-CM

## 2018-09-22 DIAGNOSIS — R911 Solitary pulmonary nodule: Secondary | ICD-10-CM | POA: Diagnosis not present

## 2018-09-22 DIAGNOSIS — Z6831 Body mass index (BMI) 31.0-31.9, adult: Secondary | ICD-10-CM | POA: Diagnosis not present

## 2018-09-22 LAB — COMPREHENSIVE METABOLIC PANEL
ALT: 19 U/L (ref 0–35)
AST: 15 U/L (ref 0–37)
Albumin: 4.1 g/dL (ref 3.5–5.2)
Alkaline Phosphatase: 82 U/L (ref 39–117)
BUN: 11 mg/dL (ref 6–23)
CO2: 29 mEq/L (ref 19–32)
Calcium: 9.4 mg/dL (ref 8.4–10.5)
Chloride: 104 mEq/L (ref 96–112)
Creatinine, Ser: 0.57 mg/dL (ref 0.40–1.20)
GFR: 111.16 mL/min (ref >60.00)
Glucose, Bld: 81 mg/dL (ref 70–99)
Potassium: 3.8 mEq/L (ref 3.5–5.1)
Sodium: 141 mEq/L (ref 135–145)
Total Bilirubin: 0.5 mg/dL (ref 0.2–1.2)
Total Protein: 6.6 g/dL (ref 6.0–8.3)

## 2018-09-22 LAB — LIPID PANEL
Cholesterol: 141 mg/dL (ref 0–200)
HDL: 45.9 mg/dL (ref >39.00)
LDL Cholesterol: 66 mg/dL (ref 0–99)
NonHDL: 95.25
Total CHOL/HDL Ratio: 3
Triglycerides: 145 mg/dL (ref 0.0–149.0)
VLDL: 29 mg/dL (ref 0.0–40.0)

## 2018-09-22 LAB — CBC
HCT: 43.4 % (ref 36.0–46.0)
Hemoglobin: 14.7 g/dL (ref 12.0–15.0)
MCHC: 33.8 g/dL (ref 30.0–36.0)
MCV: 91 fl (ref 78.0–100.0)
Platelets: 281 10*3/uL (ref 150.0–400.0)
RBC: 4.77 Mil/uL (ref 3.87–5.11)
RDW: 15.5 % (ref 11.5–15.5)
WBC: 9.3 10*3/uL (ref 4.0–10.5)

## 2018-09-22 LAB — TSH: TSH: 4.03 u[IU]/mL (ref 0.35–4.50)

## 2018-09-22 LAB — VITAMIN B12: Vitamin B-12: 183 pg/mL — ABNORMAL LOW (ref 211–911)

## 2018-09-22 NOTE — Progress Notes (Signed)
Subjective:  Kelly Frazier is a 73 y.o. year old very pleasant female patient who presents for/with See problem oriented charting ROS- poor sleep last 2 nights with trigger iran/iraq issues. No chest pain or shortness of breath. No headache (no recent migraines) or blurry vision.  Has done well on zilretta for her knees.   Past Medical History-  Patient Active Problem List   Diagnosis Date Noted  . Solitary pulmonary nodule 01/10/2017    Priority: Medium  . Migraine 08/17/2014    Priority: Medium  . Hyperlipidemia 05/03/2007    Priority: Medium  . Essential hypertension 05/03/2007    Priority: Medium  . Hypothyroidism 04/12/2007    Priority: Medium  . Esophageal reflux 09/03/2010    Priority: Low  . Angioedema 06/21/2010    Priority: Low  . History of colonic polyps 09/27/2007    Priority: Low  . Osteoarthritis 04/12/2007    Priority: Low  . Arthralgia of both knees 11/13/2017  . Trigger finger, left 08/03/2017  . Snoring 06/05/2016  . Excessive daytime sleepiness 06/05/2016  . Shortness of breath 03/11/2016    Medications- reviewed and updated Current Outpatient Medications  Medication Sig Dispense Refill  . atorvastatin (LIPITOR) 10 MG tablet Take 1 tablet (10 mg total) by mouth daily. 90 tablet 3  . bisoprolol-hydrochlorothiazide (ZIAC) 2.5-6.25 MG tablet Take 1 tablet by mouth daily. 90 tablet 3  . clobetasol ointment (TEMOVATE) 0.05 % Apply twice daily to vulva externally    . diclofenac sodium (VOLTAREN) 1 % GEL Apply topically to affected area qid 100 g 1  . EPINEPHrine (EPIPEN JR) 0.15 MG/0.3ML injection Inject 0.15 mg into the muscle as needed.      Marland Kitchen ibuprofen (ADVIL,MOTRIN) 200 MG tablet Take 200 mg by mouth every 6 (six) hours as needed for headache, mild pain or cramping.     Marland Kitchen levothyroxine (SYNTHROID, LEVOTHROID) 75 MCG tablet Take 1 tablet (75 mcg total) by mouth daily. 90 tablet 3  . Multiple Vitamins-Minerals (WOMENS 50+ ADVANCED PO) Take 2 tablets by  mouth daily.    Marland Kitchen omeprazole (PRILOSEC) 20 MG capsule Take 1 capsule (20 mg total) by mouth daily. 90 capsule 3  . oxybutynin (DITROPAN-XL) 5 MG 24 hr tablet Take by mouth.     No current facility-administered medications for this visit.     Objective: BP 98/72 (BP Location: Left Arm, Patient Position: Sitting, Cuff Size: Large)   Pulse (!) 49   Temp 97.6 F (36.4 C) (Oral)   Ht 5\' 4"  (1.626 m)   Wt 183 lb 12.8 oz (83.4 kg)   SpO2 96%   BMI 31.55 kg/m  Gen: NAD, resting comfortably Wears hearing aids-remove these and tympanic membrane's were normal CV: RRR-not bradycardic on my exam-no murmurs rubs or gallops Lungs: CTAB no crackles, wheeze, rhonchi Abdomen: soft/nontender/nondistended/normal bowel sounds.  Ext: no edema Skin: warm, dry Neuro: Speech normal, moves all extremities  Assessment/Plan:  Other notes: 1.waiting to get 2nd shingrix- otherwise up to date on vaccines  2.  Elevated BMI noted-we discussed trying to work off 3 to 5 pounds over the next 6 to 12 months.  She is not exercising currently and recommended she start.  She thinks she eats a pretty healthy diet  Hypothyroidism S: On thyroid medication-levothyroxine 75 mcg Lab Results  Component Value Date   TSH 1.31 03/01/2018  A/P: Suspect stable-update TSH today   Hyperlipidemia S: Well controlled on atorvastatin 10 mg with last LDL at 73  A/P: Time for fully  updated lipid panel-update today  Essential hypertension S: controlled on bisoprolol hydrochlorothiazide 2.5-6.25 mg.  This also helps with migraine prevention BP Readings from Last 3 Encounters:  09/22/18 98/72  08/25/18 118/72  08/20/18 134/84  A/P: We discussed blood pressure goal of <140/90. Continue current meds  Solitary pulmonary nodule S: Based on prior CT 12/2016- 12 x 13 mm ground-glass density region in the posterior right lower lobe, unchanged since 10 months ago. CT is recommended every 2 years until 5 years of stability has been  established. A/P: We discussed today following this nodule up-we will order scan after next visit in 6 months  Esophageal reflux S: on omeprazole 20mg - does pretty well with sparing breakthrough symptoms- usually related to bowl of ice cream before bed A/P: stable- continue current medicine. I do want to check a b12 level with long term PPI   Future Appointments  Date Time Provider [place]  11/19/2018 12:20 PM [name], DO [facility]  08/31/2019  3:00 PM [facility] HEALTH COACH [facility]   Return in about 6 months (around 03/23/2019) for follow up- or sooner if needed.  Lab/Order associations: coffee with creamer/milk Hypothyroidism, unspecified type - Plan: TSH  Hyperlipidemia, unspecified hyperlipidemia type - Plan: CBC, Comprehensive metabolic panel, Lipid panel  High risk medication use - Plan: Vitamin B12  Essential hypertension  Solitary pulmonary nodule  Gastroesophageal reflux disease without esophagitis  Return precautions advised.  Garret Reddish, MD

## 2018-09-22 NOTE — Assessment & Plan Note (Signed)
S: on omeprazole 20mg - does pretty well with sparing breakthrough symptoms- usually related to bowl of ice cream before bed A/P: stable- continue current medicine. I do want to check a b12 level with long term PPI

## 2018-09-22 NOTE — Assessment & Plan Note (Signed)
S: controlled on bisoprolol hydrochlorothiazide 2.5-6.25 mg.  This also helps with migraine prevention BP Readings from Last 3 Encounters:  09/22/18 98/72  08/25/18 118/72  08/20/18 134/84  A/P: We discussed blood pressure goal of <140/90. Continue current meds

## 2018-09-22 NOTE — Assessment & Plan Note (Signed)
S: Well controlled on atorvastatin 10 mg with last LDL at 73  A/P: Time for fully updated lipid panel-update today

## 2018-09-22 NOTE — Assessment & Plan Note (Signed)
S: Based on prior CT 12/2016- 12 x 13 mm ground-glass density region in the posterior right lower lobe, unchanged since 10 months ago. CT is recommended every 2 years until 5 years of stability has been established. A/P: We discussed today following this nodule up-we will order scan after next visit in 6 months

## 2018-09-22 NOTE — Patient Instructions (Signed)
Please stop by lab before you go  Would love to see you get back in your exercise routine- low impact would be preferable given your knee issues  Would love to see another 3-5 lbs off within 6-12 months off- slow and steady wins the race!   Pleasure to see you today- hope 2020 goes well for you!

## 2018-09-22 NOTE — Assessment & Plan Note (Signed)
S: On thyroid medication-levothyroxine 75 mcg Lab Results  Component Value Date   TSH 1.31 03/01/2018  A/P: Suspect stable-update TSH today

## 2018-09-29 ENCOUNTER — Ambulatory Visit (INDEPENDENT_AMBULATORY_CARE_PROVIDER_SITE_OTHER): Payer: Medicare Other | Admitting: *Deleted

## 2018-09-29 ENCOUNTER — Encounter: Payer: Self-pay | Admitting: *Deleted

## 2018-09-29 DIAGNOSIS — E538 Deficiency of other specified B group vitamins: Secondary | ICD-10-CM

## 2018-09-29 MED ORDER — CYANOCOBALAMIN 1000 MCG/ML IJ SOLN
1000.0000 ug | Freq: Once | INTRAMUSCULAR | Status: AC
  Administered 2018-09-29: 1000 ug via INTRAMUSCULAR

## 2018-09-29 NOTE — Progress Notes (Signed)
Per orders of Dr. Juleen China, injection of Cyanocobalamin 1000 mcg given IM Right  Deltoid by Anselmo Pickler, LPN Patient tolerated injection well. Pt told to return in one week for next B12 Injection.

## 2018-10-03 ENCOUNTER — Other Ambulatory Visit: Payer: Self-pay | Admitting: Family Medicine

## 2018-10-04 NOTE — Progress Notes (Signed)
Noted and agree. Briscoe Deutscher DO

## 2018-10-06 ENCOUNTER — Ambulatory Visit (INDEPENDENT_AMBULATORY_CARE_PROVIDER_SITE_OTHER): Payer: Medicare Other

## 2018-10-06 DIAGNOSIS — E538 Deficiency of other specified B group vitamins: Secondary | ICD-10-CM | POA: Diagnosis not present

## 2018-10-06 MED ORDER — CYANOCOBALAMIN 1000 MCG/ML IJ SOLN
1000.0000 ug | Freq: Once | INTRAMUSCULAR | Status: AC
  Administered 2018-10-06: 1000 ug via INTRAMUSCULAR

## 2018-10-06 NOTE — Progress Notes (Signed)
Per orders of Dr.Hunter, injection of Vitamin B 12  given by Loralyn Freshwater.Given in left deltoid.Patient tolerated injection well.

## 2018-10-11 NOTE — Progress Notes (Signed)
Noted  

## 2018-10-11 NOTE — Progress Notes (Signed)
I have reviewed and agree with note, evaluation, plan.   Wonda Goodgame, MD  

## 2018-10-12 ENCOUNTER — Ambulatory Visit: Payer: Self-pay | Admitting: *Deleted

## 2018-10-12 NOTE — Telephone Encounter (Signed)
Message from Conception Chancy, NT sent at 10/12/2018 1:54 PM EST   Patient is calling and states she had her 2nd shingle shot yesterday 10/12/18 and she has had chills and wants to know if the shot was the cause.     Pt received the first shingles vaccine with having some soreness to her arm.  She received the 2nd one yesterday around 12 noon. And about 9:30 or 10 last night she had chills thru out her body. Finally got to sleep about 4 or 5. No more chills but still have a slight headache, along with arm being sore. She is wondering about getting her Vitamin B 12 injection. Flow at [facility] notified regarding the patient's question about the Vitamin B 12 injection. Per RN, pt can get the B 12 injection, but she does not have it scheduled. Notified patient and she scheduled her B 12 injections for this week and next. Routing to flow at [employer].

## 2018-10-12 NOTE — Telephone Encounter (Signed)
See note

## 2018-10-12 NOTE — Telephone Encounter (Signed)
Patient notified of update and verbalized understanding. Pt was informed that these were the side effects.

## 2018-10-13 ENCOUNTER — Ambulatory Visit (INDEPENDENT_AMBULATORY_CARE_PROVIDER_SITE_OTHER): Payer: Medicare Other

## 2018-10-13 DIAGNOSIS — E538 Deficiency of other specified B group vitamins: Secondary | ICD-10-CM | POA: Diagnosis not present

## 2018-10-13 MED ORDER — CYANOCOBALAMIN 1000 MCG/ML IJ SOLN
1000.0000 ug | Freq: Once | INTRAMUSCULAR | Status: AC
  Administered 2018-10-13: 1000 ug via INTRAMUSCULAR

## 2018-10-13 NOTE — Progress Notes (Signed)
Per orders of Inda Coke , injection of B-12 given by Francella Solian in left deltoid. Patient tolerated injection well. Will need injection #4 next week then can go to monthly after. Patient did have Singrix injection on Monday and did have some muscle aches the next day. I verified with Dr. Rogers Blocker that it is ok for patient to receive injection today. Patient did request to get in left arm because Singrix was placed in right.

## 2018-10-20 ENCOUNTER — Ambulatory Visit (INDEPENDENT_AMBULATORY_CARE_PROVIDER_SITE_OTHER): Payer: Medicare Other

## 2018-10-20 DIAGNOSIS — E538 Deficiency of other specified B group vitamins: Secondary | ICD-10-CM | POA: Diagnosis not present

## 2018-10-20 MED ORDER — CYANOCOBALAMIN 1000 MCG/ML IJ SOLN
1000.0000 ug | Freq: Once | INTRAMUSCULAR | Status: AC
  Administered 2018-10-20: 1000 ug via INTRAMUSCULAR

## 2018-10-20 NOTE — Progress Notes (Signed)
Per orders of Dr. Hunter, injection of vitamin B12 1000 mcg given in right deltoid by Hien Perreira, CMA.  Patient tolerated injection well.  

## 2018-10-21 ENCOUNTER — Encounter: Payer: Self-pay | Admitting: Sports Medicine

## 2018-10-21 NOTE — Progress Notes (Signed)
Kelly Frazier. Kelly Frazier, [place] at [place] - 73 y.o. female MRN [id number]  Date of birth: 1946-02-26  Visit Date: 08/20/2018  PCP: Marin Olp, MD   Referred by: Marin Olp, MD  SUBJECTIVE:   Chief Complaint  Patient presents with  . f/u osteoarthritis L knee    Zilretta inj 04/26/18. Taking IBU and using Voltaren.     HPI: Patient is here for follow-up of her left knee osteoarthritis.  She is interested in undergoing an Zilretta injection again today she had great improvement following the last injection she has had recurrence of pain that is described as mild that is on and off occasionally preventing her from performing activities that she would like.  She additionally reports that she is having some thumb pain and needs a refill on Voltaren gel.  REVIEW OF SYSTEMS: Denies fevers, chills, recent weight gain or weight loss.  No night sweats. No significant nighttime awakenings due to this issue. Pt denies any change in bowel or bladder habits, muscle weakness, numbness or falls associated with this pain.  HISTORY:  Prior history reviewed and updated per electronic medical record.  Patient Active Problem List   Diagnosis Date Noted  . Vitamin B 12 deficiency 09/22/2018  . Arthralgia of both knees 11/13/2017    Xray L and R  knees 11/13/17 - IMPRESSION:  1. Mild bilateral knee osteoarthritis worse in the patellofemoral compartment       2.   Hyperostosis of the anterior left patella, incidental    . Trigger finger, left 08/03/2017    Prednisone low dose was helpful but did not fully resolve symptoms   . Solitary pulmonary nodule 01/10/2017    12/2016- 12 x 13 mm ground-glass density region in the posterior right lower lobe, unchanged since 10 months ago. CT is recommended every 2 years until 5 years of stability has been established.   . Snoring 06/05/2016  . Excessive daytime sleepiness  06/05/2016  . Shortness of breath 03/11/2016  . Migraine 08/17/2014    Takes bisoprolol for prevention of migraines as well as birth control pills. Relpax triptan.    . Esophageal reflux 09/03/2010    aciphex ppi--> prilosec as not covered   . Angioedema 06/21/2010    Patient had angioedema as a response to medications she is shown oral swelling from both Naprosyn and Lamisil.    Marland Kitchen History of colonic polyps 09/27/2007    Hyperplastic polyps 2007-repeat 2018 shows adenomatous polyp    . Hyperlipidemia 05/03/2007    Atorvastatin 10mg       . Essential hypertension 05/03/2007    Bisoprolol-hctz 2.5-5.25.     Marland Kitchen Hypothyroidism 04/12/2007    Levothyroxine 75 mcg  Lab Results  Component Value Date   TSH 2.84 08/17/2014       . Osteoarthritis 04/12/2007    Lumbar, thoracic, cervical. History PT at times. 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 Smoking status: Never Smoker  . Smokeless tobacco: Never Used  Substance and Sexual Activity  . Alcohol use: Yes    Alcohol/week: 2.0 standard drinks    Types: 2 Glasses of wine per week  . Drug use: No  . Sexual activity: Yes   Social History   Social History Narrative   Divorced in 65s. 1  son. 2 twin grandkids age 68 in 77- in [place].       Retired from [employer].     OBJECTIVE:  VS:  HT:5\' 4"  (162.6 cm)   WT:183 lb 3.2 oz (83.1 kg)  BMI:31.43    BP:134/84  HR:(!) 58bpm  TEMP: ( )  RESP:98 %   PHYSICAL EXAM: Adult female. No acute distress.  Alert and appropriate. Bilateral knees with a moderate amount of osteoarthritic bossing bilaterally but worse on the left.  She has a small supraphysiologic effusion with moderate synovitis.  Ligamentously stable. CMC bossing consistent with CMC OA..   ASSESSMENT:   1. Primary osteoarthritis of both knees   2. Primary osteoarthritis of both first carpometacarpal joints       PROCEDURES:  US Guided Injection per procedure note      PLAN:  Pertinent additional documentation may be included in corresponding procedure notes, imaging studies, problem based documentation and patient instructions.  No problem-specific Assessment & Plan notes found for this encounter.  Repeat Zilretta injection performed today.  She should do well with this.  Refill of diclofenac for both knees and the hands.  Activity modifications and the importance of avoiding exacerbating activities (limiting pain to no more than a 4 / 10 during or following activity) recommended and discussed.  Discussed red flag symptoms that warrant earlier emergent evaluation and patient voices understanding.   Meds ordered this encounter  Medications  . diclofenac sodium (VOLTAREN) 1 % GEL    Sig: Apply topically to affected area qid    Dispense:  100 g    Refill:  1   Lab Orders  No laboratory test(s) ordered today   Imaging Orders     [place] MSK POCT ULTRASOUND Referral Orders  No referral(s) requested today    At follow up will plan to consider: Repeat Zilretta injections  Return in about 13 weeks (around 11/19/2018).          [name], [place] [facility] Physician

## 2018-11-19 ENCOUNTER — Ambulatory Visit (INDEPENDENT_AMBULATORY_CARE_PROVIDER_SITE_OTHER): Payer: Medicare Other | Admitting: Sports Medicine

## 2018-11-19 ENCOUNTER — Ambulatory Visit: Payer: Self-pay

## 2018-11-19 ENCOUNTER — Ambulatory Visit: Payer: Medicare Other | Admitting: Sports Medicine

## 2018-11-19 ENCOUNTER — Encounter: Payer: Self-pay | Admitting: Sports Medicine

## 2018-11-19 VITALS — BP 130/76 | HR 62 | Ht 64.0 in | Wt 187.4 lb

## 2018-11-19 DIAGNOSIS — G8929 Other chronic pain: Secondary | ICD-10-CM

## 2018-11-19 DIAGNOSIS — M25562 Pain in left knee: Secondary | ICD-10-CM

## 2018-11-19 DIAGNOSIS — M170 Bilateral primary osteoarthritis of knee: Secondary | ICD-10-CM | POA: Diagnosis not present

## 2018-11-19 DIAGNOSIS — M25511 Pain in right shoulder: Secondary | ICD-10-CM

## 2018-11-19 DIAGNOSIS — M25512 Pain in left shoulder: Secondary | ICD-10-CM

## 2018-11-19 DIAGNOSIS — G2589 Other specified extrapyramidal and movement disorders: Secondary | ICD-10-CM

## 2018-11-19 NOTE — Procedures (Signed)
PROCEDURE NOTE:  Ultrasound Guided: Injection: Left knee Images were obtained and interpreted by myself, Teresa Coombs, DO  Images have been saved and stored to PACS system. Images obtained on: GE S7 Ultrasound machine    ULTRASOUND FINDINGS:  Small supraphysiologic effusion.  Generalized synovitis previously noted has improved.  DESCRIPTION OF PROCEDURE:  The patient's clinical condition is marked by substantial pain and/or significant functional disability. Other conservative therapy has not provided relief, is contraindicated, or not appropriate. There is a reasonable likelihood that injection will significantly improve the patient's pain and/or functional impairment.   After discussing the risks, benefits and expected outcomes of the injection and all questions were reviewed and answered, the patient wished to undergo the above named procedure.  Verbal consent was obtained.  The ultrasound was used to identify the target structure and adjacent neurovascular structures. The skin was then prepped in sterile fashion and the target structure was injected under direct visualization using sterile technique as below:  Single injection performed as below: PREP: Alcohol and Ethel Chloride APPROACH:superiolateral, single injection, 21g 2 in. INJECTATE: 5cc Zilretta (32mg /69mL Sustained Release Triamcinolone) ASPIRATE: None DRESSING: Band-Aid  Post procedural instructions including recommending icing and warning signs for infection were reviewed.    This procedure was well tolerated and there were no complications.   IMPRESSION: Succesful Ultrasound Guided: Injection

## 2018-11-19 NOTE — Patient Instructions (Addendum)

## 2018-11-19 NOTE — Progress Notes (Signed)
Kelly Frazier. Kelly Frazier, [place] at [place] - 73 y.o. female MRN [id number]  Date of birth: Jan 25, 1946  Visit Date: November 19, 2018  PCP: Marin Olp, MD   Referred by: Marin Olp, MD  SUBJECTIVE:  Chief Complaint  Patient presents with  . Follow-up    L knee pain.  Zilretta injections - 04/26/18 and 08/20/18    HPI: Patient is here for follow-up of her left knee pain.  She is status post a Zilretta injection 13 weeks ago and reported a small return of her symptoms over the past 2 weeks.  She is getting some sharp stabbing pain especially along the anterior medial aspect.  No significant swelling at this time.  She additionally does report that she is having bilateral shoulder and arm pain that radiates to the lateral deltoids that is worse at night when she lies down.  This does not seem to keep her awake at night but does bother her with sleep onset.  Her arms are otherwise doing well from the standpoint of strength and she denies any numbness or tingling.  No significant neck stiffness.  REVIEW OF SYSTEMS: No significant nighttime awakenings due to this issue. Denies fevers, chills, recent weight gain or weight loss.  No night sweats.  Pt denies any change in bowel or bladder habits, muscle weakness, numbness or falls associated with this pain.  HISTORY:  Prior history reviewed and updated per electronic medical record.  Patient Active Problem List   Diagnosis Date Noted  . Vitamin B 12 deficiency 09/22/2018  . Arthralgia of both knees 11/13/2017    Xray L and R  knees 11/13/17 - IMPRESSION:  1. Mild bilateral knee osteoarthritis worse in the patellofemoral compartment       2.   Hyperostosis of the anterior left patella, incidental    . Trigger finger, left 08/03/2017    Prednisone low dose was helpful but did not fully resolve symptoms   . Solitary pulmonary nodule 01/10/2017    12/2016- 12 x  13 mm ground-glass density region in the posterior right lower lobe, unchanged since 10 months ago. CT is recommended every 2 years until 5 years of stability has been established.   . Snoring 06/05/2016  . Excessive daytime sleepiness 06/05/2016  . Shortness of breath 03/11/2016  . Migraine 08/17/2014    Takes bisoprolol for prevention of migraines as well as birth control pills. Relpax triptan.    . Esophageal reflux 09/03/2010    aciphex ppi--> prilosec as not covered   . Angioedema 06/21/2010    Patient had angioedema as a response to medications she is shown oral swelling from both Naprosyn and Lamisil.    Marland Kitchen History of colonic polyps 09/27/2007    Hyperplastic polyps 2007-repeat 2018 shows adenomatous polyp    . Hyperlipidemia 05/03/2007    Atorvastatin 10mg       . Essential hypertension 05/03/2007    Bisoprolol-hctz 2.5-5.25.     Marland Kitchen Hypothyroidism 04/12/2007    Levothyroxine 75 mcg  Lab Results  Component Value Date   TSH 2.84 08/17/2014       . Osteoarthritis 04/12/2007    Lumbar, thoracic, cervical. History PT at times. 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 Smoking status: Never Smoker  . Smokeless tobacco: Never  Used  Substance and Sexual Activity  . Alcohol use: Yes    Alcohol/week: 2.0 standard drinks    Types: 2 Glasses of wine per week  . Drug use: No  . Sexual activity: Yes   Social History   Social History Narrative   Divorced in 24s. 1 son. 2 twin grandkids age 55 in 27- in [place].       Retired from [employer].     OBJECTIVE:  VS:  HT:5\' 4"  (162.6 cm)   WT:187 lb 6.4 oz (85 kg)  BMI:32.15    BP:130/76  HR:62bpm  TEMP: ( )  RESP:96 %   PHYSICAL EXAM: Adult female. No acute distress.  Alert and appropriate. Left knee has generalized osteoarthritic bossing.  Her knee extensor mechanism strength is intact.  Bilateral shoulders  overall well aligned.  She has good intrinsic rotator cuff strength.  She does have scapular dyskinesis with overhead range of motion.  Cervical sidebending and rotation is limited.  Negative Spurling's compression tests and Lhermitte's compression test.   ASSESSMENT:  1. Left knee pain, unspecified chronicity   2. Primary osteoarthritis of both knees   3. Chronic pain of both shoulders   4. Scapular dyskinesis     PROCEDURES:  US Guided Injection per procedure note    PROCEDURE NOTE: THERAPEUTIC EXERCISES (29798)   Discussed the foundation of treatment for this condition is physical therapy and/or daily (5-6 days/week) therapeutic exercises, focusing on core strengthening, coordination, neuromuscular control/reeducation. 15 minutes spent for Therapeutic exercises as below and as referenced in the AVS. This included exercises focusing on stretching, strengthening, with significant focus on eccentric aspects.  Proper technique shown and discussed handout in great detail with ATC. All questions were discussed and answered.   Long term goals include an improvement in range of motion, strength, endurance as well as avoiding reinjury. Frequency of visits is one time as determined during today's office visit. Frequency of exercises to be performed is as per handout.  EXERCISES REVIEWED:   Intrinsic Rotator Cuff Exercises Scapular Stabilization      PLAN:  Pertinent additional documentation may be included in corresponding procedure notes, imaging studies, problem based documentation and patient instructions.  Repeat Zilretta injection elected to be performed today given the recurrence of symptoms over the past several weeks.  Optimistic that this will give her more sustained relief going forward.  Her shoulders do seem to be consistent with scapular dyskinesis and likely some early rotator cuff tendinopathy without evidence of tearing given the overall well-preserved strength.  Therapeutic  exercises reviewed in detail with her.  Home Therapeutic exercises prescribed today per procedure note.  Activity modifications and the importance of avoiding exacerbating activities (limiting pain to no more than a 4 / 10 during or following activity) recommended and discussed.   Discussed red flag symptoms that warrant earlier emergent evaluation and patient voices understanding.    No orders of the defined types were placed in this encounter. Lab Orders  No laboratory test(s) ordered today    Imaging Orders  [place] MSK POCT ULTRASOUND  Referral Orders  No referral(s) requested today    At follow up will plan to consider : Zilretta (sustained release Triamcinolone)  Return in about 13 weeks (around 02/18/2019).          Kelly Frazier, [place] Sports Medicine Physician

## 2018-12-15 ENCOUNTER — Ambulatory Visit: Payer: Medicare Other | Admitting: Sports Medicine

## 2018-12-30 ENCOUNTER — Other Ambulatory Visit: Payer: Self-pay | Admitting: Family Medicine

## 2019-02-18 ENCOUNTER — Ambulatory Visit (INDEPENDENT_AMBULATORY_CARE_PROVIDER_SITE_OTHER): Payer: Medicare Other | Admitting: Family Medicine

## 2019-02-18 ENCOUNTER — Encounter: Payer: Self-pay | Admitting: Family Medicine

## 2019-02-18 ENCOUNTER — Other Ambulatory Visit: Payer: Self-pay

## 2019-02-18 ENCOUNTER — Ambulatory Visit: Payer: Medicare Other | Admitting: Sports Medicine

## 2019-02-18 DIAGNOSIS — M1712 Unilateral primary osteoarthritis, left knee: Secondary | ICD-10-CM | POA: Diagnosis not present

## 2019-02-18 DIAGNOSIS — M170 Bilateral primary osteoarthritis of knee: Secondary | ICD-10-CM | POA: Insufficient documentation
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
## 2019-02-18 NOTE — Patient Instructions (Addendum)
ood to see you  Ice is your friend INjected the knee again today  We will get you approved for the gel injections just in case but if this works we can see you every 3 months  Have an appointment in 6 weeks just in case  Call us at [phone] when you need [place]

## 2019-02-18 NOTE — Assessment & Plan Note (Signed)
Mild to moderate overall.  We discussed different treatment options.  Patient wanted to continue with current Zilretta which patient has had some improvement with.  Patient wants to continue with conservative therapy otherwise.  Could be a candidate for Visco supplementation if needed.  Follow-up again in 6 weeks

## 2019-03-23 ENCOUNTER — Encounter: Payer: Self-pay | Admitting: Family Medicine

## 2019-03-23 ENCOUNTER — Ambulatory Visit (INDEPENDENT_AMBULATORY_CARE_PROVIDER_SITE_OTHER): Payer: Medicare Other | Admitting: Family Medicine

## 2019-03-23 ENCOUNTER — Other Ambulatory Visit: Payer: Self-pay

## 2019-03-23 VITALS — BP 110/68 | HR 61 | Temp 98.5°F | Ht 64.0 in | Wt 186.0 lb

## 2019-03-23 DIAGNOSIS — E669 Obesity, unspecified: Secondary | ICD-10-CM | POA: Diagnosis not present

## 2019-03-23 DIAGNOSIS — E785 Hyperlipidemia, unspecified: Secondary | ICD-10-CM

## 2019-03-23 DIAGNOSIS — I10 Essential (primary) hypertension: Secondary | ICD-10-CM

## 2019-03-23 DIAGNOSIS — K219 Gastro-esophageal reflux disease without esophagitis: Secondary | ICD-10-CM | POA: Diagnosis not present

## 2019-03-23 DIAGNOSIS — R911 Solitary pulmonary nodule: Secondary | ICD-10-CM

## 2019-03-23 DIAGNOSIS — E538 Deficiency of other specified B group vitamins: Secondary | ICD-10-CM

## 2019-03-23 DIAGNOSIS — T63461A Toxic effect of venom of wasps, accidental (unintentional), initial encounter: Secondary | ICD-10-CM

## 2019-03-23 DIAGNOSIS — E039 Hypothyroidism, unspecified: Secondary | ICD-10-CM

## 2019-03-23 DIAGNOSIS — R918 Other nonspecific abnormal finding of lung field: Secondary | ICD-10-CM | POA: Diagnosis not present

## 2019-03-23 LAB — BASIC METABOLIC PANEL
BUN: 13 mg/dL (ref 6–23)
CO2: 27 mEq/L (ref 19–32)
Calcium: 9.3 mg/dL (ref 8.4–10.5)
Chloride: 103 mEq/L (ref 96–112)
Creatinine, Ser: 0.67 mg/dL (ref 0.40–1.20)
GFR: 86.66 mL/min (ref >60.00)
Glucose, Bld: 94 mg/dL (ref 70–99)
Potassium: 4.2 mEq/L (ref 3.5–5.1)
Sodium: 141 mEq/L (ref 135–145)

## 2019-03-23 LAB — TSH: TSH: 2.24 u[IU]/mL (ref 0.35–4.50)

## 2019-03-23 LAB — VITAMIN B12: Vitamin B-12: 401 pg/mL (ref 211–911)

## 2019-03-23 MED ORDER — DICLOFENAC SODIUM 1 % TD GEL: TRANSDERMAL | 1 refills | Status: DC

## 2019-03-23 NOTE — Patient Instructions (Addendum)
Please stop by lab before you go If you do not have mychart- we will call you about results within 5 business days of [place] receiving them.  If you have mychart- we will send your results within 3 business days of [place] receiving them.  If abnormal or we want to clarify a result, we will call or mychart you to make sure you receive the message.  If you have questions or concerns or don't hear within 5-7 days, please send [place] a message or call us.    We will call you within two weeks about your referral for CT scan. If you do not hear within 3 weeks, give [place] a call.    No other changes

## 2019-03-23 NOTE — Progress Notes (Signed)
Phone [phone]   Subjective:  Kelly Frazier is a 72 y.o. year old very pleasant female patient who presents for/with See problem oriented charting Chief Complaint  Patient presents with  . Hypothyroidism  . Hyperlipidemia   ROS- no fever or chills reported. No edema or chest pain reported.    Past Medical History-  Patient Active Problem List   Diagnosis Date Noted  . Vitamin B 12 deficiency 09/22/2018    Priority: Medium  . Solitary pulmonary nodule 01/10/2017    Priority: Medium  . Migraine 08/17/2014    Priority: Medium  . Hyperlipidemia 05/03/2007    Priority: Medium  . Essential hypertension 05/03/2007    Priority: Medium  . Hypothyroidism 04/12/2007    Priority: Medium  . Degenerative joint disease of knee, left 02/18/2019    Priority: Low  . Arthralgia of both knees 11/13/2017    Priority: Low  . Trigger finger, left 08/03/2017    Priority: Low  . Esophageal reflux 09/03/2010    Priority: Low  . Angioedema 06/21/2010    Priority: Low  . History of colonic polyps 09/27/2007    Priority: Low  . Osteoarthritis 04/12/2007    Priority: Low  . Snoring 06/05/2016  . Excessive daytime sleepiness 06/05/2016  . Shortness of breath 03/11/2016    Medications- reviewed and updated Current Outpatient Medications  Medication Sig Dispense Refill  . atorvastatin (LIPITOR) 10 MG tablet TAKE 1 TABLET BY MOUTH EVERY DAY 90 tablet 2  . bisoprolol-hydrochlorothiazide (ZIAC) 2.5-6.25 MG tablet TAKE 1 TABLET BY MOUTH EVERY DAY 90 tablet 0  . clobetasol ointment (TEMOVATE) 0.05 % Apply twice daily to vulva externally    . diclofenac sodium (VOLTAREN) 1 % GEL Apply topically to affected area qid 100 g 1  . EPINEPHrine (EPIPEN JR) 0.15 MG/0.3ML injection Inject 0.15 mg into the muscle as needed.      Marland Kitchen ibuprofen (ADVIL,MOTRIN) 200 MG tablet Take 200 mg by mouth every 6 (six) hours as needed for headache, mild pain or cramping.     Marland Kitchen levothyroxine (SYNTHROID, LEVOTHROID) 75  MCG tablet TAKE 1 TABLET BY MOUTH EVERY DAY 90 tablet 2  . Multiple Vitamins-Minerals (WOMENS 50+ ADVANCED PO) Take 2 tablets by mouth daily.    Marland Kitchen omeprazole (PRILOSEC) 20 MG capsule TAKE 1 CAPSULE BY MOUTH EVERY DAY 90 capsule 2  . oxybutynin (DITROPAN-XL) 5 MG 24 hr tablet Take by mouth.    Marland Kitchen SHINGRIX injection     . vitamin B-12 (CYANOCOBALAMIN) 1000 MCG tablet Take 1,000 mcg by mouth daily.     No current facility-administered medications for this visit.      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   #hyperlipidemia S: well  controlled on Atorvastatin 10 mg daily.  Lab Results  Component Value Date   CHOL 141 09/22/2018   HDL 45.90 09/22/2018   LDLCALC 66 09/22/2018   LDLDIRECT 73.0 03/01/2018   TRIG 145.0 09/22/2018   CHOLHDL 3 09/22/2018   A/P: Stable. Continue current medications.   #hypothyroidism S: On thyroid medication-Levothyroxine 75 mcg daily.   Lab Results  Component Value Date   TSH 4.03 09/22/2018  A/P: Stable. Continue current medications.   # GERD/b12 deficiency S:Taking Omeprazole 20 mg daily for reflux- still gets breakthrough at times so have not been able to try h2 blocker  b12- did weekly injections and now on b12 1000 mcg daily Lab Results  Component Value Date   VITAMINB12 183 (L)  09/22/2018  A/P: gerd is controlled- continue current medicine. b12 compliant with OTC medicine- update b12 today   #hypertension S: controlled on Bisoprolol-HCTZ 2.5-6.25 mg daily.  BP Readings from Last 3 Encounters:  03/23/19 110/68  02/18/19 130/86  11/19/18 130/76  A/P:  Stable. Continue current medications.   # Osteoarthritis Bilateral Knees S:Seen by [facility]. Using Voltaren gel prn. Dr. Paulla Fore no longer at our office. Has had zilretta injections as well A/P: will refill voltaren for patient as has been helpful  - she is following with Dr. Tamala Julian now  From last note " Solitary pulmonary nodule S: Based on prior CT 12/2016- 12 x 13 mm ground-glass density region in the posterior right lower lobe, unchanged since 10 months ago. CT is recommended every 2 years until 5 years of stability has been established. A/P: We discussed today following this nodule up-we will order scan after next visit in 6 months" -ordered CT scan today for follow up ground glass density  # Obesity  S:weight has been stable despite covid 19.  Trying to to eat reasonably balanced diet with fruits/veggies.  Wt Readings from Last 3 Encounters:  03/23/19 186 lb (84.4 kg)  02/18/19 187 lb (84.8 kg)  11/19/18 187 lb 6.4 oz (85 kg)  A/P: stable but could benefit from weight loss- particularly with knees  -Encouraged need for healthy eating, regular exercise, weight loss.  - discussed myfitness pal  Future Appointments  Date Time Provider [place]  04/01/2019 11:00 AM Lyndal Pulley, DO [facility]  09/23/2019  1:00 PM Marin Olp, MD [facility]   Return in about 6 months (around 09/23/2019) for follow up- or sooner if needed.  Lab/Order associations:   ICD-10-CM   1. Hyperlipidemia, unspecified hyperlipidemia type  E78.5   2. Hypothyroidism, unspecified type  E03.9 TSH  3. Essential hypertension  V61 Basic metabolic panel  4. Vitamin B 12 deficiency  E53.8 Vitamin B12  5. Solitary  pulmonary nodule  R91.1   6. Gastroesophageal reflux disease without esophagitis  K21.9   7. Wasp sting, accidental or unintentional, initial encounter  T63.461A   8. Obesity (BMI 30-39.9)  E66.9   9. Ground glass opacity present on imaging of lung  R91.8 CT Chest W Contrast   Meds ordered this encounter  Medications  . diclofenac sodium (VOLTAREN) 1 % GEL    Sig: Apply topically to affected area qid    Dispense:  100 g    Refill:  1   Return precautions advised.  Garret Reddish, MD

## 2019-03-25 ENCOUNTER — Other Ambulatory Visit: Payer: Self-pay | Admitting: Family Medicine

## 2019-03-31 ENCOUNTER — Other Ambulatory Visit: Payer: Self-pay

## 2019-03-31 ENCOUNTER — Ambulatory Visit (HOSPITAL_BASED_OUTPATIENT_CLINIC_OR_DEPARTMENT_OTHER)
Admission: RE | Admit: 2019-03-31 | Discharge: 2019-03-31 | Disposition: A | Discharge status: Home / Self Care | Payer: Medicare Other | Source: Ambulatory Visit | Attending: Family Medicine | Admitting: Family Medicine

## 2019-03-31 ENCOUNTER — Encounter (HOSPITAL_BASED_OUTPATIENT_CLINIC_OR_DEPARTMENT_OTHER): Payer: Self-pay

## 2019-03-31 DIAGNOSIS — R918 Other nonspecific abnormal finding of lung field: Secondary | ICD-10-CM | POA: Diagnosis not present

## 2019-03-31 MED ORDER — IOHEXOL 300 MG/ML  SOLN
100.0000 mL | Freq: Once | INTRAMUSCULAR | Status: AC | PRN
  Administered 2019-03-31: 80 mL via INTRAVENOUS

## 2019-04-01 ENCOUNTER — Ambulatory Visit: Payer: Medicare Other | Admitting: Family Medicine

## 2019-04-06 DIAGNOSIS — Z01419 Encounter for gynecological examination (general) (routine) without abnormal findings: Secondary | ICD-10-CM | POA: Diagnosis not present

## 2019-05-16 DIAGNOSIS — Z23 Encounter for immunization: Secondary | ICD-10-CM | POA: Diagnosis not present

## 2019-07-01 ENCOUNTER — Encounter: Payer: Self-pay | Admitting: Family Medicine

## 2019-07-01 ENCOUNTER — Other Ambulatory Visit: Payer: Self-pay

## 2019-07-01 ENCOUNTER — Ambulatory Visit (INDEPENDENT_AMBULATORY_CARE_PROVIDER_SITE_OTHER): Payer: Medicare Other | Admitting: Family Medicine

## 2019-07-01 VITALS — BP 118/74 | HR 62 | Temp 98.0°F | Ht 64.0 in | Wt 187.0 lb

## 2019-07-01 DIAGNOSIS — L0291 Cutaneous abscess, unspecified: Secondary | ICD-10-CM | POA: Diagnosis not present

## 2019-07-01 MED ORDER — DOXYCYCLINE HYCLATE 100 MG PO TABS: 100.0000 mg | ORAL_TABLET | Freq: Two times a day (BID) | ORAL | 0 refills | Status: DC

## 2019-07-01 NOTE — Patient Instructions (Signed)
It was very nice to see you today!  Please start the doxycycline.  Let me know or let Dr. Yong Channel know if your symptoms do not improve in the next 5 to 7 days.  Take care, Dr Jerline Pain   Incision and Drainage, Care After This sheet gives you information about how to care for yourself after your procedure. Your health care provider may also give you more specific instructions. If you have problems or questions, contact your health care provider. What can I expect after the procedure? After the procedure, it is common to have:  Pain or discomfort around the incision site.  Blood, fluid, or pus (drainage) from the incision.  Redness and firm skin around the incision site. Follow these instructions at home: Medicines  Take over-the-counter and prescription medicines only as told by your health care provider.  If you were prescribed an antibiotic medicine, use or take it as told by your health care provider. Do not stop using the antibiotic even if you start to feel better. Wound care Follow instructions from your health care provider about how to take care of your wound. Make sure you:  Wash your hands with soap and water before and after you change your bandage (dressing). If soap and water are not available, use hand sanitizer.  Change your dressing and packing as told by your health care provider. ? If your dressing is dry or stuck when you try to remove it, moisten or wet the dressing with saline or water so that it can be removed without harming your skin or tissues. ? If your wound is packed, leave it in place until your health care provider tells you to remove it. To remove the packing, moisten or wet the packing with saline or water so that it can be removed without harming your skin or tissues.  Leave stitches (sutures), skin glue, or adhesive strips in place. These skin closures may need to stay in place for 2 weeks or longer. If adhesive strip edges start to loosen and curl up,  you may trim the loose edges. Do not remove adhesive strips completely unless your health care provider tells you to do that. Check your wound every day for signs of infection. Check for:  More redness, swelling, or pain.  More fluid or blood.  Warmth.  Pus or a bad smell. If you were sent home with a drain tube in place, follow instructions from your health care provider about:  How to empty it.  How to care for it at home.  General instructions  Rest the affected area.  Do not take baths, swim, or use a hot tub until your health care provider approves. Ask your health care provider if you may take showers. You may only be allowed to take sponge baths.  Return to your normal activities as told by your health care provider. Ask your health care provider what activities are safe for you. Your health care provider may put you on activity or lifting restrictions.  The incision will continue to drain. It is normal to have some clear or slightly bloody drainage. The amount of drainage should lessen each day.  Do not apply any creams, ointments, or liquids unless you have been told to by your health care provider.  Keep all follow-up visits as told by your health care provider. This is important. Contact a health care provider if:  Your cyst or abscess returns.  You have a fever or chills.  You have more redness,  swelling, or pain around your incision.  You have more fluid or blood coming from your incision.  Your incision feels warm to the touch.  You have pus or a bad smell coming from your incision.  You have red streaks above or below the incision site. Get help right away if:  You have severe pain or bleeding.  You cannot eat or drink without vomiting.  You have decreased urine output.  You become short of breath.  You have chest pain.  You cough up blood.  The affected area becomes numb or starts to tingle. These symptoms may represent a serious problem that  is an emergency. Do not wait to see if the symptoms will go away. Get medical help right away. Call your local emergency services (911 in the [place] Do not drive yourself to the hospital. Summary  After this procedure, it is common to have fluid, blood, or pus coming from the surgery site.  Follow all home care instructions. You will be told how to take care of your incision, how to check for infection, and how to take medicines.  If you were prescribed an antibiotic medicine, take it as told by your health care provider. Do not stop taking the antibiotic even if you start to feel better.  Contact a health care provider if you have increased redness, swelling, or pain around your incision. Get help right away if you have chest pain, you vomit, you cough up blood, or you have shortness of breath.  Keep all follow-up visits as told by your health care provider. This is important. This information is not intended to replace advice given to you by your health care provider. Make sure you discuss any questions you have with your health care provider. Document Released: 11/24/2011 Document Revised: 08/02/2018 Document Reviewed: 08/02/2018 Elsevier Patient Education  2020 [employer].

## 2019-07-01 NOTE — Progress Notes (Signed)
   Chief Complaint:  Kelly Frazier is a 73 y.o. female who presents for same day appointment with a chief complaint of cyst.   Assessment/Plan:  Abscess No signs of systemic infection.  I&D performed today, tolerated below-see below procedure note.  Given surrounding cellulitis, also start short course of doxycycline.  Discussed reasons to return to care.  Follow-up as needed.     Subjective:  HPI:  Cyst, acute problem Started a few days ago. Left arm pit. No obvious triggering or precipitating events. She has never had anything like this. Some redness and pain. No fevers or chills. Neosporin did not help.  No other treatments tried. No other obvious alleviating or aggravating factors.   ROS: Per HPI  PMH: She reports that she has never smoked. She has never used smokeless tobacco. She reports current alcohol use of about 2.0 standard drinks of alcohol per week. She reports that she does not use drugs.      Objective:  Physical Exam: BP 118/74   Pulse 62   Temp 98 F (36.7 C)   Ht 5\' 4"  (1.626 m)   Wt 187 lb (84.8 kg)   SpO2 97%   BMI 32.10 kg/m   Gen: NAD, resting comfortably Skin: Approximately 1 cm x 1 cm erythematous, fluctuant area in left axilla.  Mild amount of surrounding erythema.  Incision and Drainage Procedure Note  Pre-operative Diagnosis: Abscess   Post-operative Diagnosis: same  Indications: Therapeutic  Anesthesia: 1% lidocaine with epinephrine  Procedure Details  The procedure, risks and complications have been discussed in detail (including, but not limited to airway compromise, infection, bleeding) with the patient, and the patient has signed consent to the procedure.  The skin was sterilely prepped and draped over the affected area in the usual fashion. After adequate local anesthesia, I&D with a #15 blade was performed on the left axilla. Purulent drainage: absent The patient was observed until stable.  EBL: 1 cc's  Condition: Tolerated  procedure well  Complications: none.       Algis Greenhouse. Jerline Pain, MD 07/01/2019 1:38 PM

## 2019-07-05 ENCOUNTER — Other Ambulatory Visit: Payer: Self-pay | Admitting: Family Medicine

## 2019-07-07 ENCOUNTER — Other Ambulatory Visit: Payer: Self-pay | Admitting: Family Medicine

## 2019-07-25 ENCOUNTER — Other Ambulatory Visit: Payer: Self-pay

## 2019-07-25 ENCOUNTER — Encounter: Payer: Self-pay | Admitting: Family Medicine

## 2019-07-25 ENCOUNTER — Ambulatory Visit (INDEPENDENT_AMBULATORY_CARE_PROVIDER_SITE_OTHER): Payer: Medicare Other | Admitting: Family Medicine

## 2019-07-25 VITALS — BP 124/78 | HR 65 | Temp 97.7°F | Ht 64.0 in | Wt 187.0 lb

## 2019-07-25 DIAGNOSIS — R21 Rash and other nonspecific skin eruption: Secondary | ICD-10-CM

## 2019-07-25 MED ORDER — TRIAMCINOLONE ACETONIDE 0.1 % EX CREA: 1.0000 "application " | TOPICAL_CREAM | Freq: Two times a day (BID) | CUTANEOUS | 0 refills | Status: DC

## 2019-07-25 MED ORDER — KETOCONAZOLE 2 % EX CREA: 1.0000 "application " | TOPICAL_CREAM | Freq: Two times a day (BID) | CUTANEOUS | 0 refills | Status: DC

## 2019-07-25 MED ORDER — DOXYCYCLINE HYCLATE 100 MG PO TABS: 100.0000 mg | ORAL_TABLET | Freq: Two times a day (BID) | ORAL | 0 refills | Status: DC

## 2019-07-25 NOTE — Progress Notes (Signed)
   Chief Complaint:  Kelly Frazier is a 73 y.o. female who presents today with a chief complaint of Rash .   Assessment/Plan:  Rash Consistent with contact dermatitis however possibly could represent fungal infection given anatomic area.  Will start triamcinolone and ketoconazole.  Possibly has recurrence of cellulitis as well will start doxycycline to cover for this.  Discussed reasons to return to care.  If no improvement will need ultrasound and/or biopsy.    Subjective:  HPI:  Rash Seen about a month ago underwent I&D for abscess in right axilla.  She was also started on doxycycline.  Located in left axilla Symptoms improved however worsened over the last couple of weeks.  She has tried neosporin with no improvement.  Some itchiness.  Some redness.  No fever or chills.  No other treatments tried.   ROS: Per HPI  PMH: She reports that she has never smoked. She has never used smokeless tobacco. She reports current alcohol use of about 2.0 standard drinks of alcohol per week. She reports that she does not use drugs.      Objective:  Physical Exam: BP 124/78   Pulse 65   Temp 97.7 F (36.5 C)   Ht 5\' 4"  (1.626 m)   Wt 187 lb (84.8 kg)   SpO2 96%   BMI 32.10 kg/m   Skin: Left axilla with erythematous area approximately 3 cm x 1 cm and axial crease.  Also small area of fluctuance where previous abscess was noted.  Small amount of maceration noted as well.     Kelly Frazier. Kelly Pain, MD 07/25/2019 1:13 PM

## 2019-07-25 NOTE — Patient Instructions (Signed)
It was very nice to see you today!  Please start the triamcinolone and ketoconazole creams.  Please also start the doxycycline.  If your symptoms worsen or not improve please let us know.  Take care, Dr Jerline Pain  Please try these tips to maintain a healthy lifestyle:   Eat at least 3 REAL meals and 1-2 snacks per day.  Aim for no more than 5 hours between eating.  If you eat breakfast, please do so within one hour of getting up.    Obtain twice as many fruits/vegetables as protein or carbohydrate foods for both lunch and dinner. (Half of each meal should be fruits/vegetables, one quarter protein, and one quarter starchy carbs)   Cut down on sweet beverages. This includes juice, soda, and sweet tea.    Exercise at least 150 minutes every week.

## 2019-08-10 ENCOUNTER — Other Ambulatory Visit: Payer: Self-pay

## 2019-08-31 ENCOUNTER — Ambulatory Visit: Payer: Medicare Other

## 2019-09-02 ENCOUNTER — Encounter (INDEPENDENT_AMBULATORY_CARE_PROVIDER_SITE_OTHER): Payer: Self-pay

## 2019-09-02 ENCOUNTER — Ambulatory Visit (INDEPENDENT_AMBULATORY_CARE_PROVIDER_SITE_OTHER): Payer: Medicare Other

## 2019-09-02 ENCOUNTER — Other Ambulatory Visit: Payer: Self-pay

## 2019-09-02 DIAGNOSIS — Z0000 Encounter for general adult medical examination without abnormal findings: Secondary | ICD-10-CM
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
## 2019-09-02 NOTE — Patient Instructions (Signed)
Kelly Frazier , Thank you for taking time to come for your Medicare Wellness Visit. I appreciate your ongoing commitment to your health goals. Please review the following plan we discussed and let me know if I can assist you in the future.   Screening recommendations/referrals: Colorectal Screening: up to date; last colonoscopy 06/29/17 Mammogram: up to date; last 06/02/18 (deferring for 2020) Bone Density: recommended   Vision and Dental Exams: Recommended annual ophthalmology exams for early detection of glaucoma and other disorders of the eye Recommended annual dental exams for proper oral hygiene  Vaccinations: Influenza vaccine: completed 05/16/19 Pneumococcal vaccine: up to date; last 02/21/16 Tdap vaccine: up to date; last 11/24/14 Shingles vaccine: Shingrix completed   Advanced directives: Please bring a copy of your POA (Power of [place]) and/or Living Will to your next appointment.  Goals: Recommend to drink at least 6-8 8oz glasses of water per day and consume a balance diet rich in fresh fruits and vegetables.   Next appointment: Please schedule your Annual Wellness Visit with your Nurse Health Advisor in one year.  Preventive Care 82 Years and Older, Female Preventive care refers to lifestyle choices and visits with your health care provider that can promote health and wellness. What does preventive care include?  A yearly physical exam. This is also called an annual well check.  Dental exams once or twice a year.  Routine eye exams. Ask your health care provider how often you should have your eyes checked.  Personal lifestyle choices, including:  Daily care of your teeth and gums.  Regular physical activity.  Eating a healthy diet.  Avoiding tobacco and drug use.  Limiting alcohol use.  Practicing safe sex.  Taking low-dose aspirin every day if recommended by your health care provider.  Taking vitamin and mineral supplements as recommended by your health care  provider. What happens during an annual well check? The services and screenings done by your health care provider during your annual well check will depend on your age, overall health, lifestyle risk factors, and family history of disease. Counseling  Your health care provider may ask you questions about your:  Alcohol use.  Tobacco use.  Drug use.  Emotional well-being.  Home and relationship well-being.  Sexual activity.  Eating habits.  History of falls.  Memory and ability to understand (cognition).  Work and work Statistician.  Reproductive health. Screening  You may have the following tests or measurements:  Height, weight, and BMI.  Blood pressure.  Lipid and cholesterol levels. These may be checked every 5 years, or more frequently if you are over 68 years old.  Skin check.  Lung cancer screening. You may have this screening every year starting at age 33 if you have a 30-pack-year history of smoking and currently smoke or have quit within the past 15 years.  Fecal occult blood test (FOBT) of the stool. You may have this test every year starting at age 12.  Flexible sigmoidoscopy or colonoscopy. You may have a sigmoidoscopy every 5 years or a colonoscopy every 10 years starting at age 26.  Hepatitis C blood test.  Hepatitis B blood test.  Sexually transmitted disease (STD) testing.  Diabetes screening. This is done by checking your blood sugar (glucose) after you have not eaten for a while (fasting). You may have this done every 1-3 years.  Bone density scan. This is done to screen for osteoporosis. You may have this done starting at age 61.  Mammogram. This may be done every  1-2 years. Talk to your health care provider about how often you should have regular mammograms. Talk with your health care provider about your test results, treatment options, and if necessary, the need for more tests. Vaccines  Your health care provider may recommend certain  vaccines, such as:  Influenza vaccine. This is recommended every year.  Tetanus, diphtheria, and acellular pertussis (Tdap, Td) vaccine. You may need a Td booster every 10 years.  Zoster vaccine. You may need this after age 19.  Pneumococcal 13-valent conjugate (PCV13) vaccine. One dose is recommended after age 49.  Pneumococcal polysaccharide (PPSV23) vaccine. One dose is recommended after age 61. Talk to your health care provider about which screenings and vaccines you need and how often you need them. This information is not intended to replace advice given to you by your health care provider. Make sure you discuss any questions you have with your health care provider. Document Released: 09/28/2015 Document Revised: 05/21/2016 Document Reviewed: 07/03/2015 Elsevier Interactive Patient Education  2017 [place] Prevention in the Home Falls can cause injuries. They can happen to people of all ages. There are many things you can do to make your home safe and to help prevent falls. What can I do on the outside of my home?  Regularly fix the edges of walkways and driveways and fix any cracks.  Remove anything that might make you trip as you walk through a door, such as a raised step or threshold.  Trim any bushes or trees on the path to your home.  Use bright outdoor lighting.  Clear any walking paths of anything that might make someone trip, such as rocks or tools.  Regularly check to see if handrails are loose or broken. Make sure that both sides of any steps have handrails.  Any raised decks and porches should have guardrails on the edges.  Have any leaves, snow, or ice cleared regularly.  Use sand or salt on walking paths during winter.  Clean up any spills in your garage right away. This includes oil or grease spills. What can I do in the bathroom?  Use night lights.  Install grab bars by the toilet and in the tub and shower. Do not use towel bars as grab  bars.  Use non-skid mats or decals in the tub or shower.  If you need to sit down in the shower, use a plastic, non-slip stool.  Keep the floor dry. Clean up any water that spills on the floor as soon as it happens.  Remove soap buildup in the tub or shower regularly.  Attach bath mats securely with double-sided non-slip rug tape.  Do not have throw rugs and other things on the floor that can make you trip. What can I do in the bedroom?  Use night lights.  Make sure that you have a light by your bed that is easy to reach.  Do not use any sheets or blankets that are too big for your bed. They should not hang down onto the floor.  Have a firm chair that has side arms. You can use this for support while you get dressed.  Do not have throw rugs and other things on the floor that can make you trip. What can I do in the kitchen?  Clean up any spills right away.  Avoid walking on wet floors.  Keep items that you use a lot in easy-to-reach places.  If you need to reach something above you, use a strong  step stool that has a grab bar.  Keep electrical cords out of the way.  Do not use floor polish or wax that makes floors slippery. If you must use wax, use non-skid floor wax.  Do not have throw rugs and other things on the floor that can make you trip. What can I do with my stairs?  Do not leave any items on the stairs.  Make sure that there are handrails on both sides of the stairs and use them. Fix handrails that are broken or loose. Make sure that handrails are as long as the stairways.  Check any carpeting to make sure that it is firmly attached to the stairs. Fix any carpet that is loose or worn.  Avoid having throw rugs at the top or bottom of the stairs. If you do have throw rugs, attach them to the floor with carpet tape.  Make sure that you have a light switch at the top of the stairs and the bottom of the stairs. If you do not have them, ask someone to add them for  you. What else can I do to help prevent falls?  Wear shoes that:  Do not have high heels.  Have rubber bottoms.  Are comfortable and fit you well.  Are closed at the toe. Do not wear sandals.  If you use a stepladder:  Make sure that it is fully opened. Do not climb a closed stepladder.  Make sure that both sides of the stepladder are locked into place.  Ask someone to hold it for you, if possible.  Clearly mark and make sure that you can see:  Any grab bars or handrails.  First and last steps.  Where the edge of each step is.  Use tools that help you move around (mobility aids) if they are needed. These include:  Canes.  Walkers.  Scooters.  Crutches.  Turn on the lights when you go into a dark area. Replace any light bulbs as soon as they burn out.  Set up your furniture so you have a clear path. Avoid moving your furniture around.  If any of your floors are uneven, fix them.  If there are any pets around you, be aware of where they are.  Review your medicines with your doctor. Some medicines can make you feel dizzy. This can increase your chance of falling. Ask your doctor what other things that you can do to help prevent falls. This information is not intended to replace advice given to you by your health care provider. Make sure you discuss any questions you have with your health care provider. Document Released: 06/28/2009 Document Revised: 02/07/2016 Document Reviewed: 10/06/2014 Elsevier Interactive Patient Education  2017 [employer].

## 2019-09-05 ENCOUNTER — Other Ambulatory Visit: Payer: Self-pay | Admitting: Family Medicine

## 2019-09-05 NOTE — Telephone Encounter (Signed)
Patient was given by Paulla Fore in the past and is now followed by Dr. Tamala Julian do you want him to fill or you?

## 2019-09-13 ENCOUNTER — Ambulatory Visit (INDEPENDENT_AMBULATORY_CARE_PROVIDER_SITE_OTHER): Payer: Medicare Other | Admitting: Family Medicine

## 2019-09-13 ENCOUNTER — Encounter: Payer: Self-pay | Admitting: Family Medicine

## 2019-09-13 DIAGNOSIS — M1712 Unilateral primary osteoarthritis, left knee: Secondary | ICD-10-CM

## 2019-09-13 NOTE — Assessment & Plan Note (Signed)
Repeat injection given today, tolerated the procedure well, discussed icing regimen and home exercise, discussed avoiding certain activities, patient is to increase activity slowly over the course the next several weeks.  Patient will follow up with me again 4 weeks.  Could be a candidate for viscosupplementation if necessary.

## 2019-09-13 NOTE — Patient Instructions (Addendum)
Happy New Year! Knee injection today See me again in 4-5 weeks

## 2019-09-13 NOTE — Progress Notes (Signed)
Kelly Frazier [place], [place] [address] [phone] Subjective:   I Kelly Frazier am serving as a Education administrator for Dr. Hulan Saas.  This visit occurred during the SARS-CoV-2 public health emergency.  Safety protocols were in place, including screening questions prior to the visit, additional usage of staff PPE, and extensive cleaning of exam room while observing appropriate contact time as indicated for disinfecting solutions.   I'm seeing this patient by the request  of:    CC:   [id number]   02/18/2019 Mild to moderate overall.  We discussed different treatment options.  Patient wanted to continue with current Zilretta which patient has had some improvement with.  Patient wants to continue with conservative therapy otherwise.  Could be a candidate for Visco supplementation if needed.  Follow-up again in 6 weeks  09/13/2019 Kelly Frazier is a 73 y.o. female coming in with complaint of left knee pain. Patient states she has had slight pain due to the holidays.  Known arthritic changes.  Patient is having some mild discomfort and pain overall.  Denies any numbness or tingling.  Patient states some mild increase in instability though.  Patient's last injection was only 7 months ago and felt like he was doing relatively well but is now having worsening pain again.       Past Medical History:  Diagnosis Date  . Allergy   . Arthritis    OA  . Cancer ([place]) 1980's   basal cell carcinoma  . Cataract    bilateral 2 yrs ago   . Diverticulosis   . Excessive daytime sleepiness 06/05/2016  . GERD (gastroesophageal reflux disease)   . Head injury due to trauma    s/p attack - some memory loss   . Headache(784.0)   . Hx of colonic polyps   . Hyperlipidemia    no meds   . Hypertension    pt denies- on  meds   . Thyroid disease    Past Surgical History:  Procedure Laterality Date  . AXILLARY ABCESS IRRIGATION AND DEBRIDEMENT     cyst  .  bcca from eyelid     basal cell 1980s.   Marland Kitchen BREAST BIOPSY  2006   and 2017  . CATARACT EXTRACTION, BILATERAL    . COLONOSCOPY  08/17/06  . NSVD  1971   x1  . osseous implant    . POLYPECTOMY    . removal of moles and denmatoid     Social History   Socioeconomic History  . Marital status: Single    Spouse name: Not on file  . Number of children: Not on file  . Years of education: Not on file  . 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 Smoking status: Never Smoker  . Smokeless tobacco: Never Used  Substance and Sexual Activity  . Alcohol use: Yes    Alcohol/week: 2.0 standard drinks    Types: 2 Glasses of wine per week  . Drug use: No  . Sexual activity: Yes  Other Topics Concern  . Not on file  Social History Narrative   Divorced in 40s. 1 son. 2 twin grandkids age 56 in 38- in [place].       Retired from [place] [employer].       Works with Solicitor for [employer]  Financial Resource Strain:   . Difficulty of Paying Living Expenses: Not on file  Food Insecurity:   . Worried About Charity fundraiser in the Last Year: Not on file  . Ran Out of Food in the Last Year: Not on file  Transportation Needs:   . Lack of Transportation (Medical): Not on file  . Lack of Transportation (Non-Medical): Not on file  Physical Activity:   . Days of Exercise per Week: Not on file  . Minutes of Exercise per Session: Not on file  Stress:   . Feeling of Stress : Not on file  Social Connections:   . Frequency of Communication with Friends and Family: Not on file  . Frequency of Social Gatherings with Friends and Family: Not on file  . Attends Religious Services: Not on file  . Active Member of Clubs or Organizations: Not on file  . Attends Archivist Meetings: Not on file  . Marital Status: Not on file   Allergies  Allergen Reactions  .  Naprosyn [Naproxen] Swelling  . Terbinafine Hcl Swelling    Lamisil    Family History  Problem Relation Age of Onset  . Cancer Mother        lung- nonsmoker. in her 33s, perhaps related to job  . Hypertension Mother   . Lung cancer Mother   . Liver disease Father   . Alcohol abuse Father   . Hypertension Father   . Ovarian cancer Maternal Grandmother   . Hodgkin's lymphoma Maternal Grandfather   . Dementia Paternal Grandmother   . Tuberculosis Paternal Grandfather   . Colon cancer Maternal Aunt   . Colon cancer Maternal Uncle   . Colon polyps Neg Hx   . Esophageal cancer Neg Hx   . Rectal cancer Neg Hx   . Stomach cancer Neg Hx     Current Outpatient Medications (Endocrine & Metabolic):  .  levothyroxine (SYNTHROID) 75 MCG tablet, TAKE 1 TABLET BY MOUTH EVERY DAY  Current Outpatient Medications (Cardiovascular):  .  atorvastatin (LIPITOR) 10 MG tablet, TAKE 1 TABLET BY MOUTH EVERY DAY .  bisoprolol-hydrochlorothiazide (ZIAC) 2.5-6.25 MG tablet, TAKE 1 TABLET BY MOUTH EVERY DAY .  EPINEPHrine (EPIPEN JR) 0.15 MG/0.3ML injection, Inject 0.15 mg into the muscle as needed.     Current Outpatient Medications (Analgesics):  .  ibuprofen (ADVIL,MOTRIN) 200 MG tablet, Take 200 mg by mouth every 6 (six) hours as needed for headache, mild pain or cramping.   Current Outpatient Medications (Hematological):  .  vitamin B-12 (CYANOCOBALAMIN) 1000 MCG tablet, Take 1,000 mcg by mouth daily.  Current Outpatient Medications (Other):  .  clobetasol ointment (TEMOVATE) 0.05 %, Apply twice daily to vulva externally .  diclofenac sodium (VOLTAREN) 1 % GEL, Apply topically to affected area qid .  diclofenac Sodium (VOLTAREN) 1 % GEL, APPLY TOPICALLY TO AFFECTED AREA 4 TIMES A DAY .  ketoconazole (NIZORAL) 2 % cream, Apply 1 application topically 2 (two) times daily. .  Multiple Vitamins-Minerals (WOMENS 50+ ADVANCED PO), Take 2 tablets by mouth daily. Marland Kitchen  omeprazole (PRILOSEC) 20 MG capsule,  TAKE 1 CAPSULE BY MOUTH EVERY DAY .  oxybutynin (DITROPAN-XL) 5 MG 24 hr tablet, Take by mouth. Marland Kitchen  SHINGRIX injection,  .  triamcinolone cream (KENALOG) 0.1 %, Apply 1 application topically 2 (two) times daily.    Past medical history, social, surgical and family history all reviewed in electronic medical record.  No pertanent information unless stated regarding to the chief complaint.  Review of Systems:  No headache, visual changes, nausea, vomiting, diarrhea, constipation, dizziness, abdominal pain, skin rash, fevers, chills, night sweats, weight loss, swollen lymph nodes, body aches, joint swelling, muscle aches, chest pain, shortness of breath, mood changes.   Objective  Blood pressure 130/70, pulse (!) 52, height 5\' 4"  (1.626 m), weight 186 lb (84.4 kg), SpO2 97 %.    General: No apparent distress alert and oriented x3 mood and affect normal, dressed appropriately.  HEENT: Pupils equal, extraocular movements intact  Respiratory: Patient's speak in full sentences and does not appear short of breath  Cardiovascular: No lower extremity edema, non tender, no erythema  Skin: Warm dry intact with no signs of infection or rash on extremities or on axial skeleton.  Abdomen: Soft nontender  Neuro: Cranial nerves II through XII are intact, neurovascularly intact in all extremities with 2+ DTRs and 2+ pulses.  Lymph: No lymphadenopathy of posterior or anterior cervical chain or axillae bilaterally.  Gait normal with good balance and coordination.  MSK:  Non tender with full range of motion and good stability and symmetric strength and tone of shoulders, elbows, wrist, hip, and ankles bilaterally.  Knee: Left valgus deformity noted.  Abnormal thigh to calf ratio.  Tender to palpation over medial and PF joint line.  ROM full in flexion and extension and lower leg rotation. instability with valgus force.  painful patellar compression. Patellar glide with moderate crepitus. Patellar and  quadriceps tendons unremarkable. Hamstring and quadriceps strength is normal.  After informed written and verbal consent, patient was seated on exam table. Left knee was prepped with alcohol swab and utilizing anterolateral approach, patient's left knee space was injected with 4:1  marcaine 0.5%: Kenalog 40mg /dL. Patient tolerated the procedure well without immediate complications.   Impression and Recommendations:     This case required medical decision making of moderate complexity. The above documentation has been reviewed and is accurate and complete Lyndal Pulley, DO       Note: This dictation was prepared with Dragon dictation along with smaller phrase technology. Any transcriptional errors that result from this process are unintentional.

## 2019-09-23 ENCOUNTER — Ambulatory Visit: Payer: Medicare Other | Admitting: Family Medicine

## 2019-10-11 ENCOUNTER — Ambulatory Visit: Payer: Medicare Other

## 2019-10-12 ENCOUNTER — Other Ambulatory Visit: Payer: Self-pay

## 2019-10-12 ENCOUNTER — Encounter: Payer: Self-pay | Admitting: Family Medicine

## 2019-10-12 ENCOUNTER — Ambulatory Visit (INDEPENDENT_AMBULATORY_CARE_PROVIDER_SITE_OTHER): Payer: Medicare Other | Admitting: Family Medicine

## 2019-10-12 DIAGNOSIS — M1712 Unilateral primary osteoarthritis, left knee: Secondary | ICD-10-CM | POA: Diagnosis not present

## 2019-10-12 NOTE — Patient Instructions (Signed)
Monovisc injection See me in 6-8 weeks

## 2019-10-12 NOTE — Progress Notes (Signed)
[place] Phone: [phone] Subjective:   [name], am serving as a scribe for Dr. Hulan Saas. This visit occurred during the SARS-CoV-2 public health emergency.  Safety protocols were in place, including screening questions prior to the visit, additional usage of staff PPE, and extensive cleaning of exam room while observing appropriate contact time as indicated for disinfecting solutions.   I'm seeing this patient by the request  of:  Marin Olp, MD  CC: Left knee pain follow-up  [id number]   09/13/2019 Repeat injection given today, tolerated the procedure well, discussed icing regimen and home exercise, discussed avoiding certain activities, patient is to increase activity slowly over the course the next several weeks.  Patient will follow up with me again 4 weeks.  Could be a candidate for viscosupplementation if necessary.  Update 10/12/2019 Kelly Frazier is a 74 y.o. female coming in with complaint of left knee pain. Patient states that she did have some relief but not as much as she did with previous injections. Feels like she is favoring that knee due to pain.  Patient continues to have discomfort and pain on a fairly regular basis.  Still some mild instability from time to time.    Past Medical History:  Diagnosis Date  . Allergy   . Arthritis    OA  . Cancer ([place]) 1980's   basal cell carcinoma  . Cataract    bilateral 2 yrs ago   . Diverticulosis   . Excessive daytime sleepiness 06/05/2016  . GERD (gastroesophageal reflux disease)   . Head injury due to trauma    s/p attack - some memory loss   . Headache(784.0)   . Hx of colonic polyps   . Hyperlipidemia    no meds   . Hypertension    pt denies- on  meds   . Thyroid disease    Past Surgical History:  Procedure Laterality Date  . AXILLARY ABCESS IRRIGATION AND DEBRIDEMENT     cyst  . bcca from eyelid     basal cell  1980s.   Marland Kitchen BREAST BIOPSY  2006   and 2017  . CATARACT EXTRACTION, BILATERAL    . COLONOSCOPY  08/17/06  . NSVD  1971   x1  . osseous implant    . POLYPECTOMY    . removal of moles and denmatoid     Social History   Socioeconomic History  . Marital status: Single    Spouse name: Not on file  . Number of children: Not on file  . Years of education: Not on file  . 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 Smoking status: Never Smoker  . Smokeless tobacco: Never Used  Substance and Sexual Activity  . Alcohol use: Yes    Alcohol/week: 2.0 standard drinks    Types: 2 Glasses of wine per week  . Drug use: No  . Sexual activity: Yes  Other Topics Concern  . Not on file  Social History Narrative   Divorced in 39s. 1 son. 2 twin grandkids age 12 in 83- in [place].       Retired from [place].       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    Social Determinants of Health   Financial Resource Strain:   . Difficulty of  Paying Living Expenses: Not on file  Food Insecurity:   . Worried About Charity fundraiser in the Last Year: Not on file  . Ran Out of Food in the Last Year: Not on file  Transportation Needs:   . Lack of Transportation (Medical): Not on file  . Lack of Transportation (Non-Medical): Not on file  Physical Activity:   . Days of Exercise per Week: Not on file  . Minutes of Exercise per Session: Not on file  Stress:   . Feeling of Stress : Not on file  Social Connections:   . Frequency of Communication with Friends and Family: Not on file  . Frequency of Social Gatherings with Friends and Family: Not on file  . Attends Religious Services: Not on file  . Active Member of Clubs or Organizations: Not on file  . Attends Archivist Meetings: Not on file  . Marital Status: Not on file   Allergies  Allergen Reactions  . Naprosyn [Naproxen] Swelling  .  Terbinafine Hcl Swelling    Lamisil    Family History  Problem Relation Age of Onset  . Cancer Mother        lung- nonsmoker. in her 76s, perhaps related to job  . Hypertension Mother   . Lung cancer Mother   . Liver disease Father   . Alcohol abuse Father   . Hypertension Father   . Ovarian cancer Maternal Grandmother   . Hodgkin's lymphoma Maternal Grandfather   . Dementia Paternal Grandmother   . Tuberculosis Paternal Grandfather   . Colon cancer Maternal Aunt   . Colon cancer Maternal Uncle   . Colon polyps Neg Hx   . Esophageal cancer Neg Hx   . Rectal cancer Neg Hx   . Stomach cancer Neg Hx     Current Outpatient Medications (Endocrine & Metabolic):  .  levothyroxine (SYNTHROID) 75 MCG tablet, TAKE 1 TABLET BY MOUTH EVERY DAY  Current Outpatient Medications (Cardiovascular):  .  atorvastatin (LIPITOR) 10 MG tablet, TAKE 1 TABLET BY MOUTH EVERY DAY .  bisoprolol-hydrochlorothiazide (ZIAC) 2.5-6.25 MG tablet, TAKE 1 TABLET BY MOUTH EVERY DAY .  EPINEPHrine (EPIPEN JR) 0.15 MG/0.3ML injection, Inject 0.15 mg into the muscle as needed.     Current Outpatient Medications (Analgesics):  .  ibuprofen (ADVIL,MOTRIN) 200 MG tablet, Take 200 mg by mouth every 6 (six) hours as needed for headache, mild pain or cramping.   Current Outpatient Medications (Hematological):  .  vitamin B-12 (CYANOCOBALAMIN) 1000 MCG tablet, Take 1,000 mcg by mouth daily.  Current Outpatient Medications (Other):  .  clobetasol ointment (TEMOVATE) 0.05 %, Apply twice daily to vulva externally .  diclofenac sodium (VOLTAREN) 1 % GEL, Apply topically to affected area qid .  diclofenac Sodium (VOLTAREN) 1 % GEL, APPLY TOPICALLY TO AFFECTED AREA 4 TIMES A DAY .  ketoconazole (NIZORAL) 2 % cream, Apply 1 application topically 2 (two) times daily. .  Multiple Vitamins-Minerals (WOMENS 50+ ADVANCED PO), Take 2 tablets by mouth daily. Marland Kitchen  omeprazole (PRILOSEC) 20 MG capsule, TAKE 1 CAPSULE BY MOUTH EVERY  DAY .  oxybutynin (DITROPAN-XL) 5 MG 24 hr tablet, Take by mouth. Marland Kitchen  SHINGRIX injection,  .  triamcinolone cream (KENALOG) 0.1 %, Apply 1 application topically 2 (two) times daily.   Reviewed prior external information including notes and imaging from  primary care provider As well as notes that were available from care everywhere and other healthcare systems.  Past medical history, social, surgical and family  history all reviewed in electronic medical record.  No pertanent information unless stated regarding to the chief complaint.   Review of Systems:  No headache, visual changes, nausea, vomiting, diarrhea, constipation, dizziness, abdominal pain, skin rash, fevers, chills, night sweats, weight loss, swollen lymph nodes, body aches, joint swelling, chest pain, shortness of breath, mood changes. POSITIVE muscle aches  Objective  Blood pressure 118/78, pulse 65, height 5\' 4"  (1.626 m), weight 186 lb (84.4 kg), SpO2 96 %.   General: No apparent distress alert and oriented x3 mood and affect normal, dressed appropriately.  HEENT: Pupils equal, extraocular movements intact  Respiratory: Patient's speak in full sentences and does not appear short of breath  Cardiovascular: No lower extremity edema, non tender, no erythema  Skin: Warm dry intact with no signs of infection or rash on extremities or on axial skeleton.  Abdomen: Soft nontender  Neuro: Cranial nerves II through XII are intact, neurovascularly intact in all extremities with 2+ DTRs and 2+ pulses.  Lymph: No lymphadenopathy of posterior or anterior cervical chain or axillae bilaterally.  Gait mild antalgic MSK: Knee: Left valgus deformity noted. Large thigh to calf ratio.  Tender to palpation over medial and PF joint line.  ROM full in flexion and extension and lower leg rotation. instability with valgus force.  painful patellar compression. Patellar glide with moderate crepitus. Patellar and quadriceps tendons  unremarkable. Hamstring and quadriceps strength is normal.    After informed written and verbal consent, patient was seated on exam table. Left knee was prepped with alcohol swab and utilizing anterolateral approach, patient's left knee space was injected with 22 mg/mL of Monovisc (sodium hyaluronate) in a prefilled syringe was injected easily into the knee through a 22-gauge needle..Patient tolerated the procedure well without immediate complications.    Impression and Recommendations:     This case required medical decision making of moderate complexity. The above documentation has been reviewed and is accurate and complete Lyndal Pulley, DO       Note: This dictation was prepared with Dragon dictation along with smaller phrase technology. Any transcriptional errors that result from this process are unintentional.

## 2019-10-13 ENCOUNTER — Encounter: Payer: Self-pay | Admitting: Family Medicine

## 2019-10-13 NOTE — Assessment & Plan Note (Signed)
Patient responded well to the injection.  Discussed icing regimen and home exercise, discussed avoiding certain activities.  Patient was to increase her activity slowly.  This is a chronic problem with an exacerbation.  Discussed with patient if this does not seem to work will need to consider the possibility of PRP or surgical intervention but I am optimistic she will have improvement.

## 2019-10-20 ENCOUNTER — Other Ambulatory Visit: Payer: Self-pay

## 2019-10-20 ENCOUNTER — Ambulatory Visit: Payer: Medicare Other

## 2019-10-21 ENCOUNTER — Ambulatory Visit (INDEPENDENT_AMBULATORY_CARE_PROVIDER_SITE_OTHER): Payer: Medicare Other | Admitting: Family Medicine

## 2019-10-21 ENCOUNTER — Encounter: Payer: Self-pay | Admitting: Family Medicine

## 2019-10-21 VITALS — BP 132/82 | HR 74 | Temp 98.3°F | Ht 64.0 in | Wt 185.8 lb

## 2019-10-21 DIAGNOSIS — I10 Essential (primary) hypertension: Secondary | ICD-10-CM | POA: Diagnosis not present

## 2019-10-21 DIAGNOSIS — E785 Hyperlipidemia, unspecified: Secondary | ICD-10-CM

## 2019-10-21 DIAGNOSIS — E039 Hypothyroidism, unspecified: Secondary | ICD-10-CM | POA: Diagnosis not present

## 2019-10-21 LAB — CBC WITH DIFFERENTIAL/PLATELET
Basophils Absolute: 0.1 10*3/uL (ref 0.0–0.1)
Basophils Relative: 0.5 % (ref 0.0–3.0)
Eosinophils Absolute: 0.2 10*3/uL (ref 0.0–0.7)
Eosinophils Relative: 1.8 % (ref 0.0–5.0)
HCT: 41.7 % (ref 36.0–46.0)
Hemoglobin: 13.9 g/dL (ref 12.0–15.0)
Lymphocytes Relative: 19.7 % (ref 12.0–46.0)
Lymphs Abs: 2.3 10*3/uL (ref 0.7–4.0)
MCHC: 33.2 g/dL (ref 30.0–36.0)
MCV: 91.2 fl (ref 78.0–100.0)
Monocytes Absolute: 0.7 10*3/uL (ref 0.1–1.0)
Monocytes Relative: 6 % (ref 3.0–12.0)
Neutro Abs: 8.5 10*3/uL — ABNORMAL HIGH (ref 1.4–7.7)
Neutrophils Relative %: 72 % (ref 43.0–77.0)
Platelets: 278 10*3/uL (ref 150.0–400.0)
RBC: 4.57 Mil/uL (ref 3.87–5.11)
RDW: 14.4 % (ref 11.5–15.5)
WBC: 11.9 10*3/uL — ABNORMAL HIGH (ref 4.0–10.5)

## 2019-10-21 LAB — COMPREHENSIVE METABOLIC PANEL
ALT: 21 U/L (ref 0–35)
AST: 19 U/L (ref 0–37)
Albumin: 4 g/dL (ref 3.5–5.2)
Alkaline Phosphatase: 71 U/L (ref 39–117)
BUN: 10 mg/dL (ref 6–23)
CO2: 28 mEq/L (ref 19–32)
Calcium: 9.3 mg/dL (ref 8.4–10.5)
Chloride: 103 mEq/L (ref 96–112)
Creatinine, Ser: 0.61 mg/dL (ref 0.40–1.20)
GFR: 96.42 mL/min (ref >60.00)
Glucose, Bld: 82 mg/dL (ref 70–99)
Potassium: 3.8 mEq/L (ref 3.5–5.1)
Sodium: 141 mEq/L (ref 135–145)
Total Bilirubin: 0.6 mg/dL (ref 0.2–1.2)
Total Protein: 7 g/dL (ref 6.0–8.3)

## 2019-10-21 LAB — LIPID PANEL
Cholesterol: 127 mg/dL (ref 0–200)
HDL: 43.5 mg/dL (ref >39.00)
LDL Cholesterol: 66 mg/dL (ref 0–99)
NonHDL: 83.75
Total CHOL/HDL Ratio: 3
Triglycerides: 91 mg/dL (ref 0.0–149.0)
VLDL: 18.2 mg/dL (ref 0.0–40.0)

## 2019-10-21 LAB — TSH: TSH: 1.64 u[IU]/mL (ref 0.35–4.50)

## 2019-10-21 NOTE — Progress Notes (Signed)
Phone [phone] In person visit   Subjective:   Kelly Frazier is a 74 y.o. year old very pleasant female patient who presents for/with See problem oriented charting Chief Complaint  Patient presents with  . Follow-up  . Hypothyroidism  . Hyperlipidemia  . skin tag   This visit occurred during the SARS-CoV-2 public health emergency.  Safety protocols were in place, including screening questions prior to the visit, additional usage of staff PPE, and extensive cleaning of exam room while observing appropriate contact time as indicated for disinfecting solutions.   Past Medical History-  Patient Active Problem List   Diagnosis Date Noted  . Vitamin B 12 deficiency 09/22/2018    Priority: Medium  . Solitary pulmonary nodule 01/10/2017    Priority: Medium  . Migraine 08/17/2014    Priority: Medium  . Hyperlipidemia 05/03/2007    Priority: Medium  . Essential hypertension 05/03/2007    Priority: Medium  . Hypothyroidism 04/12/2007    Priority: Medium  . Degenerative joint disease of knee, left 02/18/2019    Priority: Low  . Arthralgia of both knees 11/13/2017    Priority: Low  . Trigger finger, left 08/03/2017    Priority: Low  . Esophageal reflux 09/03/2010    Priority: Low  . Angioedema 06/21/2010    Priority: Low  . History of colonic polyps 09/27/2007    Priority: Low  . Osteoarthritis 04/12/2007    Priority: Low  . Snoring 06/05/2016  . Excessive daytime sleepiness 06/05/2016  . Shortness of breath 03/11/2016    Medications- reviewed and updated Current Outpatient Medications  Medication Sig Dispense Refill  . atorvastatin (LIPITOR) 10 MG tablet TAKE 1 TABLET BY MOUTH EVERY DAY 90 tablet 2  . bisoprolol-hydrochlorothiazide (ZIAC) 2.5-6.25 MG tablet TAKE 1 TABLET BY MOUTH EVERY DAY 90 tablet 1  . clobetasol ointment (TEMOVATE) 0.05 % Apply twice daily to vulva externally    . diclofenac sodium (VOLTAREN) 1 % GEL Apply topically to affected area qid 100 g 1    . EPINEPHrine (EPIPEN JR) 0.15 MG/0.3ML injection Inject 0.15 mg into the muscle as needed.      Marland Kitchen ibuprofen (ADVIL,MOTRIN) 200 MG tablet Take 200 mg by mouth every 6 (six) hours as needed for headache, mild pain or cramping.     Marland Kitchen ketoconazole (NIZORAL) 2 % cream Apply 1 application topically 2 (two) times daily. 60 g 0  . levothyroxine (SYNTHROID) 75 MCG tablet TAKE 1 TABLET BY MOUTH EVERY DAY 90 tablet 2  . Multiple Vitamins-Minerals (WOMENS 50+ ADVANCED PO) Take 2 tablets by mouth daily.    Marland Kitchen omeprazole (PRILOSEC) 20 MG capsule TAKE 1 CAPSULE BY MOUTH EVERY DAY 90 capsule 2  . oxybutynin (DITROPAN-XL) 5 MG 24 hr tablet Take by mouth.    . triamcinolone cream (KENALOG) 0.1 % Apply 1 application topically 2 (two) times daily. 30 g 0  . vitamin B-12 (CYANOCOBALAMIN) 1000 MCG tablet Take 1,000 mcg by mouth daily.     No current facility-administered medications for this visit.     Objective:  BP 132/82   Pulse 74   Temp 98.3 F (36.8 C)   Ht 5\' 4"  (1.626 m)   Wt 185 lb 12.8 oz (84.3 kg)   SpO2 96%   BMI 31.89 kg/m  Gen: NAD, resting comfortably CV: RRR no murmurs rubs or gallops Lungs: CTAB no crackles, wheeze, rhonchi Ext: no edema Skin: warm, dry, right calf has a small area raised- able to extract a core from this- remaining  skin largely normal other than opening where removed    Assessment and Plan  Skin lesion right calf  S: pt c/o possible skin tag the she has noticed on her right calf that she noticed a couple of months ago.  A/P: may have actually been a small sebaceous cyst on right calf- able to dislodge head of this and appeared much better   #hyperlipidemia S: compliant with atorvastatin 10 mg daily Lab Results  Component Value Date   CHOL 141 09/22/2018   HDL 45.90 09/22/2018   LDLCALC 66 09/22/2018   LDLDIRECT 73.0 03/01/2018   TRIG 145.0 09/22/2018   CHOLHDL 3 09/22/2018   A/P: hopefully stable- update lipid panel today- continue current meds.    #hypertension S: compliant with ziac 2.5-6.25 mg daily BP Readings from Last 3 Encounters:  10/21/19 132/82  10/12/19 118/78  09/13/19 130/70  A/P: Stable. Continue current medications.   #hypothyroidism S: compliant On thyroid medication-levothyroxine 75 mcg  Lab Results  Component Value Date   TSH 2.24 03/23/2019   A/P: hopefully stable- continue current meds as long as tsh normal- update tsh today  # GERD-  S:compliant with omeprazole 20 mg daily.  b12 supplement while on this.  Lab Results  Component Value Date   [id number] 03/23/2019  A/P:   Still with breakthrough  At times so have not been able to reduce medicine- continue current rx  Recommended follow up: 6 month follow up  Or sooner if needed Future Appointments  Date Time Provider [place]  11/23/2019 11:15 AM Lyndal Pulley, DO [facility] None    Lab/Order associations: coffee and cream only today   ICD-10-CM   1. Hyperlipidemia, unspecified hyperlipidemia type  E78.5 TSH    CBC with Differential/Platelet    Comprehensive metabolic panel    Lipid panel    No orders of the defined types were placed in this encounter.  Return precautions advised.  Garret Reddish, MD

## 2019-10-21 NOTE — Patient Instructions (Addendum)
Recommended follow up: 6 month follow up  Or sooner if needed  Please stop by lab before you go If you do not have mychart- we will call you about results within 5 business days of [place] receiving them.  If you have mychart- we will send your results within 3 business days of [place] receiving them.  If abnormal or we want to clarify a result, we will call or mychart you to make sure you receive the message.  If you have questions or concerns or don't hear within 5-7 days, please send [place] a message or call us.    You had an irritated cyst on back of your leg- we removed the core of this and hoping does not recur- if it does could see dermatology   Great to see you today !

## 2019-11-01 ENCOUNTER — Ambulatory Visit: Payer: Medicare Other

## 2019-11-23 ENCOUNTER — Ambulatory Visit: Payer: Medicare Other | Admitting: Family Medicine

## 2020-03-02 ENCOUNTER — Other Ambulatory Visit: Payer: Self-pay | Admitting: Family Medicine

## 2020-04-04 ENCOUNTER — Other Ambulatory Visit: Payer: Self-pay | Admitting: Family Medicine

## 2020-04-09 ENCOUNTER — Telehealth: Payer: Self-pay | Admitting: Family Medicine

## 2020-04-09 NOTE — Progress Notes (Signed)
°  Chronic Care Management   Note  04/09/2020 Name: Kelly Frazier MRN: [id number] DOB: 08/14/1946  Kelly Frazier is a 74 y.o. year old female who is a primary care patient of Marin Olp, MD. I reached out to [name] by phone today in response to a referral sent by Kelly Frazier PCP, Marin Olp, MD.   Kelly Frazier was given information about Chronic Care Management services today including:  1. CCM service includes personalized support from designated clinical staff supervised by her physician, including individualized plan of care and coordination with other care providers 2. 24/7 contact phone numbers for assistance for urgent and routine care needs. 3. Service will only be billed when office clinical staff spend 20 minutes or more in a month to coordinate care. 4. Only one practitioner may furnish and bill the service in a calendar month. 5. The patient may stop CCM services at any time (effective at the end of the month) by phone call to the office staff.   Patient agreed to services and verbal consent obtained.   Follow up plan:   Carley Perdue [employer] Scheduler

## 2020-04-20 ENCOUNTER — Ambulatory Visit: Payer: Medicare Other | Admitting: Family Medicine

## 2020-04-25 ENCOUNTER — Ambulatory Visit: Payer: Medicare Other

## 2020-04-25 DIAGNOSIS — E785 Hyperlipidemia, unspecified: Secondary | ICD-10-CM

## 2020-04-25 DIAGNOSIS — I10 Essential (primary) hypertension: Secondary | ICD-10-CM

## 2020-04-25 DIAGNOSIS — K219 Gastro-esophageal reflux disease without esophagitis: Secondary | ICD-10-CM

## 2020-04-25 NOTE — Progress Notes (Signed)
Chronic Care Management Pharmacy  Name: Kelly Frazier  MRN: [id number] DOB: 1945-12-29  Chief Complaint/ HPI  Kelly Frazier,  74 y.o. , female presents for their Initial CCM visit with the clinical pharmacist via telephone due to COVID-19 Pandemic.  PCP : Kelly Olp, MD  Chronic conditions include:  Encounter Diagnoses  Name Primary?  . Essential hypertension Yes  . Hyperlipidemia, unspecified hyperlipidemia type   . Gastroesophageal reflux disease without esophagitis     Patient Active Problem List   Diagnosis Date Noted  . Degenerative joint disease of knee, left 02/18/2019  . Vitamin B 12 deficiency 09/22/2018  . Arthralgia of both knees 11/13/2017  . Trigger finger, left 08/03/2017  . Solitary pulmonary nodule 01/10/2017  . Snoring 06/05/2016  . Excessive daytime sleepiness 06/05/2016  . Shortness of breath 03/11/2016  . Migraine 08/17/2014  . Esophageal reflux 09/03/2010  . Angioedema 06/21/2010  . History of colonic polyps 09/27/2007  . Hyperlipidemia 05/03/2007  . Essential hypertension 05/03/2007  . Hypothyroidism 04/12/2007  . Osteoarthritis 04/12/2007   Past Surgical History:  Procedure Laterality Date  . AXILLARY ABCESS IRRIGATION AND DEBRIDEMENT     cyst  . bcca from eyelid     basal cell 1980s.   Marland Kitchen BREAST BIOPSY  2006   and 2017  . CATARACT EXTRACTION, BILATERAL    . COLONOSCOPY  08/17/06  . NSVD  1971   x1  . osseous implant    . POLYPECTOMY    . removal of moles and denmatoid     Social History   Socioeconomic History  . Marital status: Single    Spouse name: Not on file  . Number of children: Not on file  . Years of education: Not on file  . 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 Smoking status: Never Smoker  . Smokeless tobacco: Never Used  Vaping Use  . Vaping Use: Never used  Substance and Sexual Activity  . Alcohol  use: Yes    Alcohol/week: 2.0 standard drinks    Types: 2 Glasses of wine per week  . Drug use: No  . Sexual activity: Yes  Other Topics Concern  . Not on file  Social History Narrative   Divorced in 62s. 1 son. 2 twin grandkids age 40 in 48- in [place].       Retired from [employer].       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    Social Determinants of Radio broadcast assistant Strain:   . Difficulty of Paying Living Expenses:   Food Insecurity: No Food Insecurity  . Worried About Charity fundraiser in the Last Year: Never true  . Ran Out of Food in the Last Year: Never true  Transportation Needs:   . Lack of Transportation (Medical):   Marland Kitchen Lack of Transportation (Non-Medical):   Physical Activity:   . Days of Exercise per Week:   . Minutes of Exercise per Session:   Stress:   . Feeling of Stress :   Social Connections:   . Frequency of Communication with Friends and Family:   . Frequency of Social Gatherings with Friends and Family:   . Attends Religious Services:   . Active Member of Clubs or Organizations:   . Attends Archivist Meetings:   Marland Kitchen Marital Status:   SDOH (Social Determinants  of Health) assessments performed: Yes. Family History  Problem Relation Age of Onset  . Cancer Mother        lung- nonsmoker. in her 25s, perhaps related to job  . Hypertension Mother   . Lung cancer Mother   . Liver disease Father   . Alcohol abuse Father   . Hypertension Father   . Ovarian cancer Maternal Grandmother   . Hodgkin's lymphoma Maternal Grandfather   . Dementia Paternal Grandmother   . Tuberculosis Paternal Grandfather   . Colon cancer Maternal Aunt   . Colon cancer Maternal Uncle   . Colon polyps Neg Hx   . Esophageal cancer Neg Hx   . Rectal cancer Neg Hx   . Stomach cancer Neg Hx    Allergies  Allergen Reactions  . Naprosyn [Naproxen] Swelling  . Terbinafine Hcl Swelling    Lamisil    Outpatient Encounter Medications as of 04/25/2020   Medication Sig  . Ascorbic Acid (VITAMIN C WITH ROSE HIPS) 500 MG tablet Take 500 mg by mouth daily.  Marland Kitchen atorvastatin (LIPITOR) 10 MG tablet TAKE 1 TABLET BY MOUTH EVERY DAY  . bisoprolol-hydrochlorothiazide (ZIAC) 2.5-6.25 MG tablet TAKE 1 TABLET BY MOUTH EVERY DAY  . Cholecalciferol (VITAMIN D3) 10 MCG (400 UNIT) CAPS Take 10 mcg by mouth.  . clobetasol ointment (TEMOVATE) 0.05 % Apply twice daily to vulva externally  . co-enzyme Q-10 30 MG capsule Take 200 mg by mouth daily.  . diclofenac sodium (VOLTAREN) 1 % GEL Apply topically to affected area qid  . EPINEPHrine (EPIPEN JR) 0.15 MG/0.3ML injection Inject 0.15 mg into the muscle as needed.    Marland Kitchen ibuprofen (ADVIL,MOTRIN) 200 MG tablet Take 200 mg by mouth every 6 (six) hours as needed for headache, mild pain or cramping.   Marland Kitchen levothyroxine (SYNTHROID) 75 MCG tablet TAKE 1 TABLET BY MOUTH EVERY DAY  . Multiple Vitamins-Minerals (WOMENS 50+ ADVANCED PO) Take 2 tablets by mouth daily.  Marland Kitchen omeprazole (PRILOSEC) 20 MG capsule TAKE 1 CAPSULE BY MOUTH EVERY DAY  . oxybutynin (DITROPAN-XL) 5 MG 24 hr tablet Take by mouth.  . Saccharomyces boulardii (PROBIOTIC) 250 MG CAPS Take by mouth.  . triamcinolone cream (KENALOG) 0.1 % Apply 1 application topically 2 (two) times daily.  . vitamin B-12 (CYANOCOBALAMIN) 1000 MCG tablet Take 1,000 mcg by mouth daily.  Marland Kitchen ketoconazole (NIZORAL) 2 % cream Apply 1 application topically 2 (two) times daily. (Patient not taking: Reported on 04/25/2020)   No facility-administered encounter medications on file as of 04/25/2020.   Patient Care Team    Relationship Specialty Notifications Start End  Kelly Olp, MD PCP - General Family Medicine  07/19/14    Comment: Kelly Larsen, MD Consulting Physician Ophthalmology  08/18/17   Frazier, Kelly Damme, MD Consulting Physician Obstetrics and Gynecology  09/02/19   Kelly Frazier, [facility] Pharmacist Pharmacist  04/09/20    Comment: Phone [phone]   Current  Diagnosis/Assessment: Goals Addressed            This Visit's Progress   . PharmD Care Plan       CARE PLAN ENTRY (see longitudinal plan of care for additional care plan information)  Current Barriers:  . Chronic Disease Management support, education, and care coordination needs related to Hypertension, Hyperlipidemia, and GERD   Hypertension BP Readings from Last 3 Encounters:  10/21/19 132/82  10/12/19 118/78  09/13/19 130/70   . Pharmacist Clinical Goal(s): o Over the next 180 days, patient will work  with PharmD and providers to maintain BP goal <130/80 . Current regimen:  o Bisoprolol-hydrochlorothiazide (ZIAC) 2.5-6.25 MG tablet once daily . Interventions: o Home BP monitoring . Patient self care activities - Over the next 180 days, patient will: o Check BP daily, document, and provide at future appointments o Ensure daily salt intake < 2300 mg/day  Hyperlipidemia Lab Results  Component Value Date/Time   LDLCALC 66 10/21/2019 02:30 PM   LDLDIRECT 73.0 03/01/2018 01:35 PM   . Pharmacist Clinical Goal(s): o Over the next 180 days, patient will work with PharmD and providers to maintain LDL goal < 70 . Current regimen:  o Atorvastatin 10 mg once daily  . Interventions: o Reviewed diet and exercise recommendations . Patient self care activities - Over the next 180 days, patient will: o Routine exercise, even if for 10-15 minutes as a start at least 3 times a week.  Acid Reflux . Pharmacist Clinical Goal(s) o Over the next 180 days, patient will work with PharmD and providers to minimize reflux symptoms . Current regimen:  o Omeprazole 10 mg once daily  . Interventions: o Continue current management o Education on reflux triggers . Patient self care activities - Over the next 180 days, patient will: o Please let me know if you wish to try a taper off of the omeprazole as discussed  Medication management . Pharmacist Clinical Goal(s): o Over the next 180 days,  patient will work with PharmD and providers to maintain optimal medication adherence . [facility] . Interventions o Comprehensive medication review performed. o Continue current medication management strategy . Patient self care activities - Over the next 180 days, patient will: o Focus on medication adherence by taking medications as prescribed and reporting any questions or concerns to PharmD and/or provider(s)  Initial goal documentation.      Hypertension   BP goal <130/80  Stage 1  BP Readings from Last 3 Encounters:  10/21/19 132/82  10/12/19 118/78  09/13/19 130/70   Patient checks BP at home infrequently Patient home BP readings are ranging: n/a  Patient is currently controlled on the following medications:   bisoprolol-hydrochlorothiazide (ZIAC) 2.5-6.25 MG tablet once daily   We discussed diet and exercise extensively.  Plan  Continue control with diet and exercise.   Hyperlipidemia   LDL goal < 100     Component Value Date/Time   CHOL 127 10/21/2019 1430   TRIG 91.0 10/21/2019 1430   TRIG 116 06/24/2006 1225   HDL 43.50 10/21/2019 1430   LDLCALC 66 10/21/2019 1430   LDLDIRECT 73.0 03/01/2018 1335    Hepatic Function Latest Ref Rng & Units 10/21/2019 09/22/2018 08/18/2017  Total Protein 6.0 - 8.3 g/dL 7.0 6.6 7.3  Albumin 3.5 - 5.2 g/dL 4.0 4.1 4.3  AST 0 - 37 U/L _0 ALT 0 - 35 U/L _1 Alk Phosphatase 39 - 117 U/L 71 82 74  Total Bilirubin 0.2 - 1.2 mg/dL 0.6 0.5 0.7  Bilirubin, Direct 0.0 - 0.3 mg/dL - - -    The ASCVD Risk score (Dunnell., et al., 2013) failed to calculate for the following reasons:   The valid total cholesterol range is 130 to 320 mg/dL   Patient is currently controlled on the following medications:  . Atorvastatin 10 mg once daily   We discussed:  diet and exercise extensively.  Plan  Upcoming labs 05/2020. Continue control with diet and exercise.  GERD   Lab Results  Component Value Date    VITAMINB12 401 03/23/2019   Patient denies heartburn. Expresses understanding to avoid triggers such as chocolate, citrus juices, fatty foods and lying down after eating.  Currently controlled on: . Omeprazole 20 mg daily  We discussed possibility of eventually tapering off of omeprazole. Educated on the the taper process.   Plan   Continue current medication.  Hypothyroidism   Lab Results  Component Value Date/Time   TSH 1.64 10/21/2019 02:30 PM   TSH 2.24 03/23/2019 12:07 PM   Patient has failed these meds in past: n/a. Patient is currently controlled on the following medications:  . Levothyroxine 75 mcg once daily   We discussed:  Proper administration of levothyroxine.   Plan  Continue current medications.  Vaccines   Immunization History  Administered Date(s) Administered  . Fluad Quad(high Dose 65+) 05/16/2019  . Influenza Split 06/23/2011  . Influenza Whole 08/31/2007, 06/27/2008, 06/21/2009, 06/21/2010, 07/31/2010  . Influenza, High Dose Seasonal PF 06/16/2016, 06/11/2017, 07/13/2018  . Influenza,inj,Quad PF,6+ Mos 06/14/2013, 06/28/2014, 06/28/2015  . Moderna SARS-COVID-2 Vaccination 10/18/2019  . Pneumococcal Conjugate-13 02/21/2016  . Pneumococcal Polysaccharide-23 09/14/2013  . Td 09/15/2005, 11/24/2014  . Zoster 12/31/2007  . Zoster Recombinat (Shingrix) 06/14/2018, 10/11/2018   Reviewed and discussed patient's vaccination history.  Up to date.   Plan  No recommendations.  Medication Management / Care Coordination   [facility] medications [facility]:  [id number] [place] [place] [facility] [address] [address]Phone: [phone]: [phone]  Denies any issues with current medication management.  Reviewed insurance regarding PAP approval for Myrbetriq if patient wanting to switch.   Plan  Continue current medication management strategy.  ___________________________ Future Appointments  Date Time Provider [place] 06/11/2020  4:00 PM [facility]in Olp [facility] 08/01/2020  1:00 PM [facility] CCM PHARMACIST [facility]   Visit follow-up:  . RPH follow-up: 3 month phone visit.  JMadelin Frazier Pharm.D., BCGP Clinical Pharmacist [phone]

## 2020-04-25 NOTE — Patient Instructions (Addendum)
Hi Kelly Frazier,   Its was very nice meeting you today. A couple things from our call-  I reviewed the patient assistance options for Myrbetriq patient assistance program. Unfortunately you would not qualify based on current insurance coverage - however, one thing Dr. Lavell Islam team could do is submit for a tier exception with your insurance. This would ultimately bring down the monthly cost of Mybetriq.  I know you have an agent you are using to help with your wellcare, but if you are needing assistance selecting a plan across multiple plan providers, you could consider going through [employer] and [facility]).  Their information is included below.    You can set up an appointment with a [facility] counselor [phone]. [phone]) to help with plan selection.    [employer] [place]  [place]      [place]  [address]  [phone] ext. [phone]    Their email is [email or web address]   Please call me at [phone] (direct line) with any questions - thank you!  - Kelly Frazier., Clinical Pharmacist  Goals Addressed            This Visit's Progress   . PharmD Care Plan       CARE PLAN ENTRY (see longitudinal plan of care for additional care plan information)  Current Barriers:  . Chronic Disease Management support, education, and care coordination needs related to Hypertension, Hyperlipidemia, and GERD   Hypertension BP Readings from Last 3 Encounters:  10/21/19 132/82  10/12/19 118/78  09/13/19 130/70   . Pharmacist Clinical Goal(s): o Over the next 180 days, patient will work with PharmD and providers to maintain BP goal <130/80 . Current regimen:  o Bisoprolol-hydrochlorothiazide (ZIAC) 2.5-6.25 MG tablet once daily . Interventions: o Home BP monitoring . Patient self care activities - Over the next 180 days, patient will: o Check BP daily, document, and provide at future appointments o Ensure daily salt intake <  2300 mg/day  Hyperlipidemia Lab Results  Component Value Date/Time   LDLCALC 66 10/21/2019 02:30 PM   LDLDIRECT 73.0 03/01/2018 01:35 PM   . Pharmacist Clinical Goal(s): o Over the next 180 days, patient will work with PharmD and providers to maintain LDL goal < 70 . Current regimen:  o Atorvastatin 10 mg once daily  . Interventions: o Reviewed diet and exercise recommendations . Patient self care activities - Over the next 180 days, patient will: o Routine exercise, even if for 10-15 minutes as a start at least 3 times a week.  Acid Reflux . Pharmacist Clinical Goal(s) o Over the next 180 days, patient will work with PharmD and providers to minimize reflux symptoms . Current regimen:  o Omeprazole 10 mg once daily  . Interventions: o Continue current management o Education on reflux triggers . Patient self care activities - Over the next 180 days, patient will: o Please let me know if you wish to try a taper off of the omeprazole as discussed  Medication management . Pharmacist Clinical Goal(s): o Over the next 180 days, patient will work with PharmD and providers to maintain optimal medication adherence . [facility] . Interventions o Comprehensive medication review performed. o Continue current medication management strategy . Patient self care activities - Over the next 180 days, patient will: o Focus on medication adherence by taking medications as prescribed and reporting any questions or concerns to PharmD and/or provider(s)  Initial goal documentation.      Ms.  Frazier was given information about Chronic Care Management services today including:  1. CCM service includes personalized support from designated clinical staff supervised by her physician, including individualized plan of care and coordination with other care providers 2. 24/7 contact phone numbers for assistance for urgent and routine care needs. 3. Standard insurance, coinsurance, copays and  deductibles apply for chronic care management only during months in which we provide at least 20 minutes of these services. Most insurances cover these services at 100%, however patients may be responsible for any copay, coinsurance and/or deductible if applicable. This service may help you avoid the need for more expensive face-to-face services. 4. Only one practitioner may furnish and bill the service in a calendar month. 5. The patient may stop CCM services at any time (effective at the end of the month) by phone call to the office staff.  Patient agreed to services and verbal consent obtained.   The patient verbalized understanding of instructions provided today and agreed to receive a mailed copy of patient instruction and/or educational materials. Telephone follow up appointment with pharmacy team member scheduled for: See next appointment with "Care Management Staff" under "What's Next" below.   Kelly Frazier, Pharm.D., BCGP Clinical Pharmacist @APPTDEP @ [phone]   High Cholesterol  High cholesterol is a condition in which the blood has high levels of a white, waxy, fat-like substance (cholesterol). The human body needs small amounts of cholesterol. The liver makes all the cholesterol that the body needs. Extra (excess) cholesterol comes from the food that we eat. Cholesterol is carried from the liver by the blood through the blood vessels. If you have high cholesterol, deposits (plaques) may build up on the walls of your blood vessels (arteries). Plaques make the arteries narrower and stiffer. Cholesterol plaques increase your risk for heart attack and stroke. Work with your health care provider to keep your cholesterol levels in a healthy range. What increases the risk? This condition is more likely to develop in people who:  Eat foods that are high in animal fat (saturated fat) or cholesterol.  Are overweight.  Are not getting enough exercise.  Have a family history of high  cholesterol. What are the signs or symptoms? There are no symptoms of this condition. How is this diagnosed? This condition may be diagnosed from the results of a blood test.  If you are older than age 55, your health care provider may check your cholesterol every 4-6 years.  You may be checked more often if you already have high cholesterol or other risk factors for heart disease. The blood test for cholesterol measures:  "Bad" cholesterol (LDL cholesterol). This is the main type of cholesterol that causes heart disease. The desired level for LDL is less than 100.  "Good" cholesterol (HDL cholesterol). This type helps to protect against heart disease by cleaning the arteries and carrying the LDL away. The desired level for HDL is 60 or higher.  Triglycerides. These are fats that the body can store or burn for energy. The desired number for triglycerides is lower than 150.  Total cholesterol. This is a measure of the total amount of cholesterol in your blood, including LDL cholesterol, HDL cholesterol, and triglycerides. A healthy number is less than 200. How is this treated? This condition is treated with diet changes, lifestyle changes, and medicines. Diet changes  This may include eating more whole grains, fruits, vegetables, nuts, and fish.  This may also include cutting back on red meat and foods that have a  lot of added sugar. Lifestyle changes  Changes may include getting at least 40 minutes of aerobic exercise 3 times a week. Aerobic exercises include walking, biking, and swimming. Aerobic exercise along with a healthy diet can help you maintain a healthy weight.  Changes may also include quitting smoking. Medicines  Medicines are usually given if diet and lifestyle changes have failed to reduce your cholesterol to healthy levels.  Your health care provider may prescribe a statin medicine. Statin medicines have been shown to reduce cholesterol, which can reduce the risk of  heart disease. Follow these instructions at home: Eating and drinking If told by your health care provider:  Eat chicken (without skin), fish, veal, shellfish, ground [place] breast, and round or loin cuts of red meat.  Do not eat fried foods or fatty meats, such as hot dogs and salami.  Eat plenty of fruits, such as apples.  Eat plenty of vegetables, such as broccoli, potatoes, and carrots.  Eat beans, peas, and lentils.  Eat grains such as barley, rice, couscous, and bulgur wheat.  Eat pasta without cream sauces.  Use skim or nonfat milk, and eat low-fat or nonfat yogurt and cheeses.  Do not eat or drink whole milk, cream, ice cream, egg yolks, or hard cheeses.  Do not eat stick margarine or tub margarines that contain trans fats (also called partially hydrogenated oils).  Do not eat saturated tropical oils, such as coconut oil and palm oil.  Do not eat cakes, cookies, crackers, or other baked goods that contain trans fats.  General instructions  Exercise as directed by your health care provider. Increase your activity level with activities such as gardening, walking, and taking the stairs.  Take over-the-counter and prescription medicines only as told by your health care provider.  Do not use any products that contain nicotine or tobacco, such as cigarettes and e-cigarettes. If you need help quitting, ask your health care provider.  Keep all follow-up visits as told by your health care provider. This is important. Contact a health care provider if:  You are struggling to maintain a healthy diet or weight.  You need help to start on an exercise program.  You need help to stop smoking. Get help right away if:  You have chest pain.  You have trouble breathing. This information is not intended to replace advice given to you by your health care provider. Make sure you discuss any questions you have with your health care provider. Document Revised: 09/04/2017 Document  Reviewed: 03/01/2016 Elsevier Patient Education  [place].

## 2020-05-16 DIAGNOSIS — Z23 Encounter for immunization: Secondary | ICD-10-CM | POA: Diagnosis not present

## 2020-05-24 DIAGNOSIS — L304 Erythema intertrigo: Secondary | ICD-10-CM | POA: Diagnosis not present

## 2020-05-28 DIAGNOSIS — L918 Other hypertrophic disorders of the skin: Secondary | ICD-10-CM | POA: Diagnosis not present

## 2020-05-28 DIAGNOSIS — L814 Other melanin hyperpigmentation: Secondary | ICD-10-CM | POA: Diagnosis not present

## 2020-05-28 DIAGNOSIS — L821 Other seborrheic keratosis: Secondary | ICD-10-CM | POA: Diagnosis not present

## 2020-05-28 DIAGNOSIS — Z85828 Personal history of other malignant neoplasm of skin: Secondary | ICD-10-CM | POA: Diagnosis not present

## 2020-05-28 DIAGNOSIS — D1801 Hemangioma of skin and subcutaneous tissue: Secondary | ICD-10-CM | POA: Diagnosis not present

## 2020-05-28 DIAGNOSIS — X32XXXS Exposure to sunlight, sequela: Secondary | ICD-10-CM | POA: Diagnosis not present

## 2020-06-10 NOTE — Patient Instructions (Addendum)
Health Maintenance Due  Topic Date Due  . MAMMOGRAM - please call to get this scheduled 06/02/2020   She has about 90 pills left that she wants to try and complete this first and then may try a week or so of Pepcid twice daily before meals.  blood pressure is very well controlled.  She is having some orthostatic symptoms.  We are going to try off the medication for the next 1 to 2 weeks and she will monitor blood pressure daily and send me an update through my chart-does well can remain off the medication.  Blood pressure goal certainly less than 718/36 on average

## 2020-06-10 NOTE — Progress Notes (Signed)
Phone [phone] In person visit   Subjective:   Kelly Frazier is a 74 y.o. year old very pleasant female patient who presents for/with See problem oriented charting Chief Complaint  Patient presents with  . Hyperlipidemia  . Hypothyroidism  . Hypertension    This visit occurred during the SARS-CoV-2 public health emergency.  Safety protocols were in place, including screening questions prior to the visit, additional usage of staff PPE, and extensive cleaning of exam room while observing appropriate contact time as indicated for disinfecting solutions.   Past Medical History-  Patient Active Problem List   Diagnosis Date Noted  . Vitamin B 12 deficiency 09/22/2018    Priority: Medium  . Solitary pulmonary nodule 01/10/2017    Priority: Medium  . Migraine 08/17/2014    Priority: Medium  . Hyperlipidemia 05/03/2007    Priority: Medium  . Essential hypertension 05/03/2007    Priority: Medium  . Hypothyroidism 04/12/2007    Priority: Medium  . Degenerative joint disease of knee, left 02/18/2019    Priority: Low  . Arthralgia of both knees 11/13/2017    Priority: Low  . Trigger finger, left 08/03/2017    Priority: Low  . Esophageal reflux 09/03/2010    Priority: Low  . Angioedema 06/21/2010    Priority: Low  . History of colonic polyps 09/27/2007    Priority: Low  . Osteoarthritis 04/12/2007    Priority: Low  . Snoring 06/05/2016  . Excessive daytime sleepiness 06/05/2016  . Shortness of breath 03/11/2016    Medications- reviewed and updated Current Outpatient Medications  Medication Sig Dispense Refill  . Ascorbic Acid (VITAMIN C WITH ROSE HIPS) 500 MG tablet Take 500 mg by mouth daily.    Marland Kitchen atorvastatin (LIPITOR) 10 MG tablet TAKE 1 TABLET BY MOUTH EVERY DAY 90 tablet 2  . bisoprolol-hydrochlorothiazide (ZIAC) 2.5-6.25 MG tablet TAKE 1 TABLET BY MOUTH EVERY DAY 90 tablet 1  . Cholecalciferol (VITAMIN D3) 10 MCG (400 UNIT) CAPS Take 10 mcg by mouth.    .  clobetasol ointment (TEMOVATE) 0.05 % Apply twice daily to vulva externally    . clotrimazole (LOTRIMIN) 1 % cream Apply topically in the morning and at bedtime.    . diclofenac sodium (VOLTAREN) 1 % GEL Apply topically to affected area qid 100 g 1  . EPINEPHrine (EPIPEN JR) 0.15 MG/0.3ML injection Inject 0.15 mg into the muscle as needed.      . hydrocortisone 2.5 % cream Apply topically in the morning and at bedtime.    Marland Kitchen ibuprofen (ADVIL,MOTRIN) 200 MG tablet Take 200 mg by mouth every 6 (six) hours as needed for headache, mild pain or cramping.     Marland Kitchen levothyroxine (SYNTHROID) 75 MCG tablet TAKE 1 TABLET BY MOUTH EVERY DAY 90 tablet 2  . Multiple Vitamins-Minerals (WOMENS 50+ ADVANCED PO) Take 2 tablets by mouth daily.    Marland Kitchen omeprazole (PRILOSEC) 20 MG capsule TAKE 1 CAPSULE BY MOUTH EVERY DAY 90 capsule 2  . oxybutynin (DITROPAN-XL) 5 MG 24 hr tablet Take by mouth.    . Saccharomyces boulardii (PROBIOTIC) 250 MG CAPS Take by mouth.    . vitamin B-12 (CYANOCOBALAMIN) 1000 MCG tablet Take 1,000 mcg by mouth daily.    Marland Kitchen co-enzyme Q-10 30 MG capsule Take 200 mg by mouth daily.     No current facility-administered medications for this visit.     Objective:  BP 122/80   Pulse 65   Temp 98.3 F (36.8 C) (Temporal)   Resp 18  Ht 5\' 4"  (1.626 m)   Wt 175 lb 9.6 oz (79.7 kg)   SpO2 92%   BMI 30.14 kg/m  Gen: NAD, resting comfortably CV: RRR no murmurs rubs or gallops Lungs: CTAB no crackles, wheeze, rhonchi Abdomen: soft/nontender/nondistended/normal bowel sounds.  Ext: no edema Skin: warm, dry     Assessment and Plan    #Leukocytosis-rest of cell lines normal. we will get CBC with differential as well as pathology smear review. Prefers to monitor unless significant changes  #hyperlipidemia-LDL goal under 70 S: Medication: Atorvastatin 10 mg  A/P: last ldl under 70- continue current medications  #hypothyroidism S: compliant On thyroid medication- Synthroid 75Mcg    A/P:hopefully stable- continue synthroid. Update tsh.    #hypertension S: medication: Ziac 2.5-6.25Mg  . Some orthostatic symptoms Home readings #s: not checking regularly A/P: blood pressure is very well controlled.  She is having some orthostatic symptoms.  We are going to try off the medication for the next 1 to 2 weeks and she will monitor blood pressure daily and send me an update through my chart-does well can remain off the medication.  Blood pressure goal certainly less than 440/10 on average  # B12 deficiency S: Current treatment/medication (oral vs. IM): Vitamin b12 1000Mcg A/P:  check b12 with labs today- continue current medications   # gerd S:well controlled on omeprazole 20mg .  She has tried in the past to come off the medication but has recurrent symptoms-has never tried to step down to Pepcid therapy A/P: She has about 90 pills left that she wants to try and complete this first and then may try a week or so of Pepcid twice daily before meals.  #Overactive bladder-on oxybutynin from gynecology  #Lung nodule-largely stable March 31, 2019 with plan for 2-year repeat   Recommended follow up:  6 months follo wup Future Appointments  Date Time Provider [place]  07/02/2020  3:15 PM Lyndal Pulley, DO [facility] None  08/01/2020  1:00 PM [facility] CCM PHARMACIST [facility]    Lab/Order associations:   ICD-10-CM   1. Essential hypertension  I10   2. Hypothyroidism, unspecified type  E03.9 TSH  3. Hyperlipidemia, unspecified hyperlipidemia type  E78.5 CBC With Differential/Platelet    COMPLETE METABOLIC PANEL WITH GFR  4. Vitamin B 12 deficiency  E53.8 Vitamin B12  5. Solitary pulmonary nodule  R91.1   6. Leukocytosis, unspecified type  D72.829 CBC With Differential/Platelet    Pathologist smear review   Return precautions advised.  Garret Reddish, MD

## 2020-06-11 ENCOUNTER — Other Ambulatory Visit: Payer: Self-pay

## 2020-06-11 ENCOUNTER — Ambulatory Visit (INDEPENDENT_AMBULATORY_CARE_PROVIDER_SITE_OTHER): Payer: Medicare Other | Admitting: Family Medicine

## 2020-06-11 ENCOUNTER — Encounter: Payer: Self-pay | Admitting: Family Medicine

## 2020-06-11 VITALS — BP 122/80 | HR 65 | Temp 98.3°F | Resp 18 | Ht 64.0 in | Wt 175.6 lb

## 2020-06-11 DIAGNOSIS — E039 Hypothyroidism, unspecified: Secondary | ICD-10-CM

## 2020-06-11 DIAGNOSIS — D72829 Elevated white blood cell count, unspecified: Secondary | ICD-10-CM

## 2020-06-11 DIAGNOSIS — I10 Essential (primary) hypertension: Secondary | ICD-10-CM

## 2020-06-11 DIAGNOSIS — E785 Hyperlipidemia, unspecified: Secondary | ICD-10-CM | POA: Diagnosis not present

## 2020-06-11 DIAGNOSIS — R911 Solitary pulmonary nodule: Secondary | ICD-10-CM

## 2020-06-11 DIAGNOSIS — E538 Deficiency of other specified B group vitamins: Secondary | ICD-10-CM

## 2020-06-12 LAB — CBC WITH DIFFERENTIAL/PLATELET
Absolute Monocytes: 510 cells/uL (ref 200–950)
Basophils Absolute: 70 cells/uL (ref 0–200)
Basophils Relative: 0.7 %
Eosinophils Absolute: 150 cells/uL (ref 15–500)
Eosinophils Relative: 1.5 %
HCT: 43.2 % (ref 35.0–45.0)
Hemoglobin: 14.4 g/dL (ref 11.7–15.5)
Lymphs Abs: 2660 cells/uL (ref 850–3900)
MCH: 30.4 pg (ref 27.0–33.0)
MCHC: 33.3 g/dL (ref 32.0–36.0)
MCV: 91.3 fL (ref 80.0–100.0)
MPV: 10.4 fL (ref 7.5–12.5)
Monocytes Relative: 5.1 %
Neutro Abs: 6610 cells/uL (ref 1500–7800)
Neutrophils Relative %: 66.1 %
Platelets: 261 10*3/uL (ref 140–400)
RBC: 4.73 10*6/uL (ref 3.80–5.10)
RDW: 13.6 % (ref 11.0–15.0)
Total Lymphocyte: 26.6 %
WBC: 10 10*3/uL (ref 3.8–10.8)

## 2020-06-12 LAB — COMPLETE METABOLIC PANEL WITH GFR
AG Ratio: 1.8 (calc) (ref 1.0–2.5)
ALT: 19 U/L (ref 6–29)
AST: 20 U/L (ref 10–35)
Albumin: 4.2 g/dL (ref 3.6–5.1)
Alkaline phosphatase (APISO): 71 U/L (ref 37–153)
BUN: 14 mg/dL (ref 7–25)
CO2: 26 mmol/L (ref 20–32)
Calcium: 9.4 mg/dL (ref 8.6–10.4)
Chloride: 104 mmol/L (ref 98–110)
Creat: 0.69 mg/dL (ref 0.60–0.93)
GFR, Est African American: 101 mL/min/{1.73_m2} (ref >=60)
GFR, Est Non African American: 87 mL/min/{1.73_m2} (ref >=60)
Globulin: 2.4 g/dL (calc) (ref 1.9–3.7)
Glucose, Bld: 78 mg/dL (ref 65–99)
Potassium: 3.8 mmol/L (ref 3.5–5.3)
Sodium: 140 mmol/L (ref 135–146)
Total Bilirubin: 0.6 mg/dL (ref 0.2–1.2)
Total Protein: 6.6 g/dL (ref 6.1–8.1)

## 2020-06-12 LAB — VITAMIN B12: Vitamin B-12: 760 pg/mL (ref 200–1100)

## 2020-06-12 LAB — PATHOLOGIST SMEAR REVIEW

## 2020-06-12 LAB — TSH: TSH: 0.96 mIU/L (ref 0.40–4.50)

## 2020-06-27 DIAGNOSIS — N3941 Urge incontinence: Secondary | ICD-10-CM | POA: Diagnosis not present

## 2020-06-27 DIAGNOSIS — Z01419 Encounter for gynecological examination (general) (routine) without abnormal findings: Secondary | ICD-10-CM | POA: Diagnosis not present

## 2020-06-27 DIAGNOSIS — R21 Rash and other nonspecific skin eruption: Secondary | ICD-10-CM | POA: Diagnosis not present

## 2020-07-02 ENCOUNTER — Ambulatory Visit (INDEPENDENT_AMBULATORY_CARE_PROVIDER_SITE_OTHER): Payer: Medicare Other | Admitting: Family Medicine

## 2020-07-02 ENCOUNTER — Encounter: Payer: Self-pay | Admitting: Family Medicine

## 2020-07-02 ENCOUNTER — Other Ambulatory Visit: Payer: Self-pay

## 2020-07-02 DIAGNOSIS — M170 Bilateral primary osteoarthritis of knee: Secondary | ICD-10-CM | POA: Diagnosis not present

## 2020-07-02 NOTE — Patient Instructions (Signed)
Good to see you Will get you approved Brooks adrenaline, [place], New balance 990  See me again in 2 months gel if needed

## 2020-07-02 NOTE — Assessment & Plan Note (Addendum)
Chronic problem with exacerbation bilateral injections given today.  Tolerated the procedure well.  We will get approval again for viscosupplementation and hope that it would be beneficial.  Did last approximately 9 months.  Discussed with patient icing regimen, home exercise, encouraged weight loss the patient has done fairly well with.  Follow-up again for 8 weeks
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
## 2020-07-06 ENCOUNTER — Other Ambulatory Visit: Payer: Self-pay | Admitting: Family Medicine

## 2020-07-06 DIAGNOSIS — Z1231 Encounter for screening mammogram for malignant neoplasm of breast: Secondary | ICD-10-CM

## 2020-07-09 ENCOUNTER — Ambulatory Visit
Admission: RE | Admit: 2020-07-09 | Discharge: 2020-07-09 | Disposition: A | Discharge status: Home / Self Care | Payer: Medicare Other | Source: Ambulatory Visit | Attending: Family Medicine | Admitting: Family Medicine

## 2020-07-09 ENCOUNTER — Other Ambulatory Visit: Payer: Self-pay

## 2020-07-09 DIAGNOSIS — Z1231 Encounter for screening mammogram for malignant neoplasm of breast: Secondary | ICD-10-CM | POA: Diagnosis not present

## 2020-07-20 DIAGNOSIS — Z23 Encounter for immunization: Secondary | ICD-10-CM | POA: Diagnosis not present

## 2020-08-01 ENCOUNTER — Ambulatory Visit: Payer: Medicare Other

## 2020-08-01 DIAGNOSIS — K219 Gastro-esophageal reflux disease without esophagitis: Secondary | ICD-10-CM

## 2020-08-01 DIAGNOSIS — I10 Essential (primary) hypertension: Secondary | ICD-10-CM

## 2020-08-01 NOTE — Progress Notes (Signed)
Chronic Care Management Pharmacy  Name: Kelly Frazier  MRN: [id number] DOB: 03/25/1946  Chief Complaint/ HPI  Kelly Frazier,  74 y.o. , female presents for their Follow-Up CCM visit with the clinical pharmacist via telephone due to COVID-19 Pandemic.  PCP : Marin Olp, MD  Chronic conditions include:  Encounter Diagnoses  Name Primary?  . Essential hypertension Yes  . Gastroesophageal reflux disease without esophagitis     Patient Active Problem List   Diagnosis Date Noted  . Degenerative arthritis of knee, bilateral 02/18/2019  . Vitamin B 12 deficiency 09/22/2018  . Arthralgia of both knees 11/13/2017  . Trigger finger, left 08/03/2017  . Solitary pulmonary nodule 01/10/2017  . Snoring 06/05/2016  . Excessive daytime sleepiness 06/05/2016  . Shortness of breath 03/11/2016  . Migraine 08/17/2014  . Esophageal reflux 09/03/2010  . Angioedema 06/21/2010  . History of colonic polyps 09/27/2007  . Hyperlipidemia 05/03/2007  . Essential hypertension 05/03/2007  . Hypothyroidism 04/12/2007  . Osteoarthritis 04/12/2007   Past Surgical History:  Procedure Laterality Date  . AXILLARY ABCESS IRRIGATION AND DEBRIDEMENT     cyst  . bcca from eyelid     basal cell 1980s.   Marland Kitchen BREAST BIOPSY  2006   and 2017  . CATARACT EXTRACTION, BILATERAL    . COLONOSCOPY  08/17/06  . NSVD  1971   x1  . osseous implant    . POLYPECTOMY    . removal of moles and denmatoid     Social History   Socioeconomic History  . Marital status: Single    Spouse name: Not on file  . Number of children: Not on file  . Years of education: Not on file  . 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 Smoking status: Never Smoker  . Smokeless tobacco: Never Used  Vaping Use  . Vaping Use: Never used  Substance and Sexual Activity  . Alcohol use: Yes    Alcohol/week: 2.0 standard drinks     Types: 2 Glasses of wine per week  . Drug use: No  . Sexual activity: Yes  Other Topics Concern  . Not on file  Social History Narrative   Divorced in 70s. 1 son. 2 twin grandkids age 15 in 89- in [place].       Retired from [employer].       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    Social Determinants of Health   Financial Resource Strain:   . Difficulty of Paying Living Expenses: Not on file  Food Insecurity: No Food Insecurity  . Worried About Charity fundraiser in the Last Year: Never true  . Ran Out of Food in the Last Year: Never true  Transportation Needs:   . Lack of Transportation (Medical): Not on file  . Lack of Transportation (Non-Medical): Not on file  Physical Activity:   . Days of Exercise per Week: Not on file  . Minutes of Exercise per Session: Not on file  Stress:   . Feeling of Stress : Not on file  Social Connections:   . Frequency of Communication with Friends and Family: Not on file  . Frequency of Social Gatherings with Friends and Family: Not on file  . Attends Religious Services: Not on file  . Active Member of Clubs or Organizations: Not on file  . Attends Club or  Organization Meetings: Not on file  . Marital Status: Not on file  SDOH (Social Determinants of Health) assessments performed: Yes. Family History  Problem Relation Age of Onset  . Cancer Mother        lung- nonsmoker. in her 72s, perhaps related to job  . Hypertension Mother   . Lung cancer Mother   . Liver disease Father   . Alcohol abuse Father   . Hypertension Father   . Ovarian cancer Maternal Grandmother   . Hodgkin's lymphoma Maternal Grandfather   . Dementia Paternal Grandmother   . Tuberculosis Paternal Grandfather   . Colon cancer Maternal Aunt   . Colon cancer Maternal Uncle   . Colon polyps Neg Hx   . Esophageal cancer Neg Hx   . Rectal cancer Neg Hx   . Stomach cancer Neg Hx    Allergies  Allergen Reactions  . Naprosyn [Naproxen] Swelling  . Terbinafine Hcl  Swelling    Lamisil    Outpatient Encounter Medications as of 08/01/2020  Medication Sig  . Ascorbic Acid (VITAMIN C WITH ROSE HIPS) 500 MG tablet Take 500 mg by mouth daily.  Marland Kitchen atorvastatin (LIPITOR) 10 MG tablet TAKE 1 TABLET BY MOUTH EVERY DAY  . bisoprolol-hydrochlorothiazide (ZIAC) 2.5-6.25 MG tablet TAKE 1 TABLET BY MOUTH EVERY DAY  . Cholecalciferol (VITAMIN D3) 10 MCG (400 UNIT) CAPS Take 10 mcg by mouth.  . clobetasol ointment (TEMOVATE) 0.05 % Apply twice daily to vulva externally  . clotrimazole (LOTRIMIN) 1 % cream Apply topically in the morning and at bedtime.  Marland Kitchen co-enzyme Q-10 30 MG capsule Take 200 mg by mouth daily.  . diclofenac sodium (VOLTAREN) 1 % GEL Apply topically to affected area qid  . EPINEPHrine (EPIPEN JR) 0.15 MG/0.3ML injection Inject 0.15 mg into the muscle as needed.    . hydrocortisone 2.5 % cream Apply topically in the morning and at bedtime.  Marland Kitchen ibuprofen (ADVIL,MOTRIN) 200 MG tablet Take 200 mg by mouth every 6 (six) hours as needed for headache, mild pain or cramping.   Marland Kitchen levothyroxine (SYNTHROID) 75 MCG tablet TAKE 1 TABLET BY MOUTH EVERY DAY  . Multiple Vitamins-Minerals (WOMENS 50+ ADVANCED PO) Take 2 tablets by mouth daily.  Marland Kitchen omeprazole (PRILOSEC) 20 MG capsule TAKE 1 CAPSULE BY MOUTH EVERY DAY  . oxybutynin (DITROPAN-XL) 5 MG 24 hr tablet Take by mouth.  . Saccharomyces boulardii (PROBIOTIC) 250 MG CAPS Take by mouth.  . vitamin B-12 (CYANOCOBALAMIN) 1000 MCG tablet Take 1,000 mcg by mouth daily.   No facility-administered encounter medications on file as of 08/01/2020.   Patient Care Team    Relationship Specialty Notifications Start End  Marin Olp, MD PCP - General Family Medicine  07/19/14    Comment: Ardean Larsen, MD Consulting Physician Ophthalmology  08/18/17   Burgin, Caffie Damme, MD Consulting Physician Obstetrics and Gynecology  09/02/19   Madelin Rear, [facility] Pharmacist Pharmacist  04/09/20    Comment: Phone [phone]    Current Diagnosis/Assessment: Goals Addressed            This Visit's Progress   . PharmD Care Plan   On track    CARE PLAN ENTRY (see longitudinal plan of care for additional care plan information)  Current Barriers:  . Chronic Disease Management support, education, and care coordination needs related to Hypertension, Hyperlipidemia, and GERD   Hypertension BP Readings from Last 3 Encounters:  10/21/19 132/82  10/12/19 118/78  09/13/19 130/70   . Pharmacist Clinical  Goal(s): o Over the next 180 days, patient will work with PharmD and providers to maintain BP goal <130/80 . Current regimen:  o Bisoprolol-hydrochlorothiazide (ZIAC) 2.5-6.25 MG tablet once daily . Interventions: o Home BP monitoring . Patient self care activities - Over the next 180 days, patient will: o Check BP daily, document, and provide at future appointments o Ensure daily salt intake < 2300 mg/day  Hyperlipidemia Lab Results  Component Value Date/Time   LDLCALC 66 10/21/2019 02:30 PM   LDLDIRECT 73.0 03/01/2018 01:35 PM   . Pharmacist Clinical Goal(s): o Over the next 180 days, patient will work with PharmD and providers to maintain LDL goal < 70 . Current regimen:  o Atorvastatin 10 mg once daily  . Interventions: o Reviewed diet and exercise recommendations . Patient self care activities - Over the next 180 days, patient will: o Routine exercise, even if for 10-15 minutes as a start at least 3 times a week.  Acid Reflux . Pharmacist Clinical Goal(s) o Over the next 180 days, patient will work with PharmD and providers to minimize reflux symptoms . Current regimen:  o Omeprazole 10 mg once daily  . Interventions: o Continue current management o Education on reflux triggers . Patient self care activities - Over the next 180 days, patient will: o Please let me know if you wish to try a taper off of the omeprazole as discussed  Medication management . Pharmacist Clinical  Goal(s): o Over the next 180 days, patient will work with PharmD and providers to maintain optimal medication adherence . Current [facility]: [facility] . Interventions o Comprehensive medication review performed. o Continue current medication management strategy . Patient self care activities - Over the next 180 days, patient will: o Focus on medication adherence by taking medications as prescribed and reporting any questions or concerns to PharmD and/or provider(s)  Initial goal documentation.      Hypertension   BP goal <130/80  BP Readings from Last 3 Encounters:  07/02/20 120/78  06/11/20 122/80  10/21/19 132/82   Patient checks BP at home infrequently. Has purchased new cuff but has not taken out of the box. Stopped BP medication about 1 month ago, feels like she has more energy, no orthostatic symptoms. Most recent on bisoprolol-hydrochlorothiazide (ZIAC) 2.5-6.25 MG tablet once daily.    Reviewed home BP monitoring at length - start monitoring BP at home daily for next 2 weeks, checking at different times and keep log. Report readings if consistently in upper 140s/90s or above 150/90.   Plan  Continue control with diet and exercise.  RPH to review BP as reported by patient or check in two weeks.   GERD   Lab Results  Component Value Date   AJOINOMV67 209 06/11/2020      Reports ~150 omeprazole pills left so wanting to reassess PPI taper/switch to famotidine once omeprazole is gone. Patient denies heartburn. Understands certain triggers to avoid chocolate, citrus juices, fatty foods and lying down after eating. Currently controlled on: . Omeprazole 20 mg daily  Plan   Continue current medication.  Vaccines   Immunization History  Administered Date(s) Administered  . Fluad Quad(high Dose 65+) 05/16/2019, 05/19/2020  . Influenza Split 06/23/2011  . Influenza Whole 08/31/2007, 06/27/2008, 06/21/2009, 06/21/2010, 07/31/2010  . Influenza, High Dose Seasonal PF  06/16/2016, 06/11/2017, 07/13/2018, 05/16/2020  . Influenza,inj,Quad PF,6+ Mos 06/14/2013, 06/28/2014, 06/28/2015  . Moderna SARS-COVID-2 Vaccination 10/18/2019, 11/17/2019, 07/20/2020  . Pneumococcal Conjugate-13 02/21/2016  . Pneumococcal Polysaccharide-23 09/14/2013  . Td 09/15/2005,  11/24/2014  . Zoster 12/31/2007  . Zoster Recombinat (Shingrix) 06/14/2018, 10/11/2018   Reviewed and discussed patient's vaccination history.  Up to date.   Plan  No recommendations.   Medication Management / Care Coordination   Receives prescription medications from:  [id number] - [place], [place] - [address] AT [place]  [address] Phone: [phone] Fax: [phone]   Denies any issues with current medication management.   Plan  Continue current medication management strategy. ___________________________ Future Appointments  Date Time Provider [place]  08/15/2020  1:30 PM [facility] CCM PHARMACIST [facility]  09/04/2020  1:45 PM Lyndal Pulley, DO [facility] None  12/13/2020  2:40 PM Yong Channel Brayton Mars, MD [facility]   Visit follow-up:  [facility] follow-up: 2 week BP call per pt request.  Madelin Rear, Pharm.D., BCGP Clinical Pharmacist [place] [phone]

## 2020-08-01 NOTE — Patient Instructions (Addendum)
[name],  I am included a blood pressure log in this handout. For home monitoring - start monitoring blood pressure at home daily for next 2 weeks, checking at different times and witting down readings in log. Report readings if consistently in upper 140s/90s or above 150/90 otherwise I will check in with you in 2 weeks!  Call me at [phone] with any questions.  Thank you, Ellin Mayhew, Pharm.D., BCGP Clinical Pharmacist [place] [phone]  Goals Addressed            This Visit's Progress   . PharmD Care Plan   On track    CARE PLAN ENTRY (see longitudinal plan of care for additional care plan information)  Current Barriers:  . Chronic Disease Management support, education, and care coordination needs related to Hypertension, Hyperlipidemia, and GERD   Hypertension BP Readings from Last 3 Encounters:  10/21/19 132/82  10/12/19 118/78  09/13/19 130/70   . Pharmacist Clinical Goal(s): o Over the next 180 days, patient will work with PharmD and providers to maintain BP goal <130/80 . Current regimen:  o Bisoprolol-hydrochlorothiazide (ZIAC) 2.5-6.25 MG tablet once daily . Interventions: o Home BP monitoring . Patient self care activities - Over the next 180 days, patient will: o Check BP daily, document, and provide at future appointments o Ensure daily salt intake < 2300 mg/day  Hyperlipidemia Lab Results  Component Value Date/Time   LDLCALC 66 10/21/2019 02:30 PM   LDLDIRECT 73.0 03/01/2018 01:35 PM   . Pharmacist Clinical Goal(s): o Over the next 180 days, patient will work with PharmD and providers to maintain LDL goal < 70 . Current regimen:  o Atorvastatin 10 mg once daily  . Interventions: o Reviewed diet and exercise recommendations . Patient self care activities - Over the next 180 days, patient will: o Routine exercise, even if for 10-15 minutes as a start at least 3 times a week.  Acid Reflux . Pharmacist Clinical  Goal(s) o Over the next 180 days, patient will work with PharmD and providers to minimize reflux symptoms . Current regimen:  o Omeprazole 10 mg once daily  . Interventions: o Continue current management o Education on reflux triggers . Patient self care activities - Over the next 180 days, patient will: o Please let me know if you wish to try a taper off of the omeprazole as discussed  Medication management . Pharmacist Clinical Goal(s): o Over the next 180 days, patient will work with PharmD and providers to maintain optimal medication adherence . Current pharmacy: [facility] . Interventions o Comprehensive medication review performed. o Continue current medication management strategy . Patient self care activities - Over the next 180 days, patient will: o Focus on medication adherence by taking medications as prescribed and reporting any questions or concerns to PharmD and/or provider(s)  Initial goal documentation.      The patient verbalized understanding of instructions provided today and agreed to receive a mailed copy of patient instruction and/or educational materials. Telephone follow up appointment with pharmacy team member scheduled for: See next appointment with "Care Management Staff" under "What's Next" below.     Managing Your Hypertension Hypertension is commonly called high blood pressure. This is when the force of your blood pressing against the walls of your arteries is too strong. Arteries are blood vessels that carry blood from your heart throughout your body. Hypertension forces the heart to work harder to pump blood, and may cause the arteries to  become narrow or stiff. Having untreated or uncontrolled hypertension can cause heart attack, stroke, kidney disease, and other problems. What are blood pressure readings? A blood pressure reading consists of a higher number over a lower number. Ideally, your blood pressure should be below 120/80. The first ("top") number is  called the systolic pressure. It is a measure of the pressure in your arteries as your heart beats. The second ("bottom") number is called the diastolic pressure. It is a measure of the pressure in your arteries as the heart relaxes. What does my blood pressure reading mean? Blood pressure is classified into four stages. Based on your blood pressure reading, your health care provider may use the following stages to determine what type of treatment you need, if any. Systolic pressure and diastolic pressure are measured in a unit called mm Hg. Normal  Systolic pressure: below 740.  Diastolic pressure: below 80. Elevated  Systolic pressure: 814-481.  Diastolic pressure: below 80. Hypertension stage 1  Systolic pressure: 856-314.  Diastolic pressure: 97-02. Hypertension stage 2  Systolic pressure: 637 or above.  Diastolic pressure: 90 or above. What health risks are associated with hypertension? Managing your hypertension is an important responsibility. Uncontrolled hypertension can lead to:  A heart attack.  A stroke.  A weakened blood vessel (aneurysm).  Heart failure.  Kidney damage.  Eye damage.  Metabolic syndrome.  Memory and concentration problems. What changes can I make to manage my hypertension? Hypertension can be managed by making lifestyle changes and possibly by taking medicines. Your health care provider will help you make a plan to bring your blood pressure within a normal range. Eating and drinking   Eat a diet that is high in fiber and potassium, and low in salt (sodium), added sugar, and fat. An example eating plan is called the DASH (Dietary Approaches to Stop Hypertension) diet. To eat this way: ? Eat plenty of fresh fruits and vegetables. Try to fill half of your plate at each meal with fruits and vegetables. ? Eat whole grains, such as whole wheat pasta, brown rice, or whole grain bread. Fill about one quarter of your plate with whole grains. ? Eat  low-fat diary products. ? Avoid fatty cuts of meat, processed or cured meats, and poultry with skin. Fill about one quarter of your plate with lean proteins such as fish, chicken without skin, beans, eggs, and tofu. ? Avoid premade and processed foods. These tend to be higher in sodium, added sugar, and fat.  Reduce your daily sodium intake. Most people with hypertension should eat less than 1,500 mg of sodium a day.  Limit alcohol intake to no more than 1 drink a day for nonpregnant women and 2 drinks a day for men. One drink equals 12 oz of beer, 5 oz of wine, or 1 oz of hard liquor. Lifestyle  Work with your health care provider to maintain a healthy body weight, or to lose weight. Ask what an ideal weight is for you.  Get at least 30 minutes of exercise that causes your heart to beat faster (aerobic exercise) most days of the week. Activities may include walking, swimming, or biking.  Include exercise to strengthen your muscles (resistance exercise), such as weight lifting, as part of your weekly exercise routine. Try to do these types of exercises for 30 minutes at least 3 days a week.  Do not use any products that contain nicotine or tobacco, such as cigarettes and e-cigarettes. If you need help quitting, ask your  health care provider.  Control any long-term (chronic) conditions you have, such as high cholesterol or diabetes. Monitoring  Monitor your blood pressure at home as told by your health care provider. Your personal target blood pressure may vary depending on your medical conditions, your age, and other factors.  Have your blood pressure checked regularly, as often as told by your health care provider. Working with your health care provider  Review all the medicines you take with your health care provider because there may be side effects or interactions.  Talk with your health care provider about your diet, exercise habits, and other lifestyle factors that may be  contributing to hypertension.  Visit your health care provider regularly. Your health care provider can help you create and adjust your plan for managing hypertension. Will I need medicine to control my blood pressure? Your health care provider may prescribe medicine if lifestyle changes are not enough to get your blood pressure under control, and if:  Your systolic blood pressure is 130 or higher.  Your diastolic blood pressure is 80 or higher. Take medicines only as told by your health care provider. Follow the directions carefully. Blood pressure medicines must be taken as prescribed. The medicine does not work as well when you skip doses. Skipping doses also puts you at risk for problems. Contact a health care provider if:  You think you are having a reaction to medicines you have taken.  You have repeated (recurrent) headaches.  You feel dizzy.  You have swelling in your ankles.  You have trouble with your vision. Get help right away if:  You develop a severe headache or confusion.  You have unusual weakness or numbness, or you feel faint.  You have severe pain in your chest or abdomen.  You vomit repeatedly.  You have trouble breathing. Summary  Hypertension is when the force of blood pumping through your arteries is too strong. If this condition is not controlled, it may put you at risk for serious complications.  Your personal target blood pressure may vary depending on your medical conditions, your age, and other factors. For most people, a normal blood pressure is less than 120/80.  Hypertension is managed by lifestyle changes, medicines, or both. Lifestyle changes include weight loss, eating a healthy, low-sodium diet, exercising more, and limiting alcohol. This information is not intended to replace advice given to you by your health care provider. Make sure you discuss any questions you have with your health care provider. Document Revised: 12/24/2018 Document  Reviewed: 07/30/2016 Elsevier Patient Education  2020 [place].  Blood pressure log Date: _______________________  a.m. _____________________(1st reading) _____________________(2nd reading)  p.m. _____________________(1st reading) _____________________(2nd reading) Date: _______________________  a.m. _____________________(1st reading) _____________________(2nd reading)  p.m. _____________________(1st reading) _____________________(2nd reading) Date: _______________________  a.m. _____________________(1st reading) _____________________(2nd reading)  p.m. _____________________(1st reading) _____________________(2nd reading) Date: _______________________  a.m. _____________________(1st reading) _____________________(2nd reading)  p.m. _____________________(1st reading) _____________________(2nd reading) Date: _______________________  a.m. _____________________(1st reading) _____________________(2nd reading)  p.m. _____________________(1st reading) _____________________(2nd reading) Date: _______________________  a.m. _____________________(1st reading) _____________________(2nd reading)  p.m. _____________________(1st reading) _____________________(2nd reading) Date: _______________________  a.m. _____________________(1st reading) _____________________(2nd reading)  p.m. _____________________(1st reading) _____________________(2nd reading) Date: _______________________  a.m. _____________________(1st reading) _____________________(2nd reading)  p.m. _____________________(1st reading) _____________________(2nd reading) Date: _______________________  a.m. _____________________(1st reading) _____________________(2nd reading)  p.m. _____________________(1st reading) _____________________(2nd reading) Date: _______________________  a.m. _____________________(1st reading) _____________________(2nd reading)  p.m. _____________________(1st reading)  _____________________(2nd reading) Date: _______________________  a.m. _____________________(1st reading) _____________________(2nd reading)  p.m. _____________________(1st reading) _____________________(2nd reading)  Date: _______________________  a.m. _____________________(1st reading) _____________________(2nd reading)  p.m. _____________________(1st reading) _____________________(2nd reading) Date: _______________________  a.m. _____________________(1st reading) _____________________(2nd reading)  p.m. _____________________(1st reading) _____________________(2nd reading) Date: _______________________  a.m. _____________________(1st reading) _____________________(2nd reading)  p.m. _____________________(1st reading) _____________________(2nd reading) Date: _______________________  a.m. _____________________(1st reading) _____________________(2nd reading)  p.m. _____________________(1st reading) _____________________(2nd reading) Date: _______________________  a.m. _____________________(1st reading) _____________________(2nd reading)  p.m. _____________________(1st reading) _____________________(2nd reading) Date: _______________________  a.m. _____________________(1st reading) _____________________(2nd reading)  p.m. _____________________(1st reading) _____________________(2nd reading) Date: _______________________  a.m. _____________________(1st reading) _____________________(2nd reading)  p.m. _____________________(1st reading) _____________________(2nd reading) Date: _______________________  a.m. _____________________(1st reading) _____________________(2nd reading)  p.m. _____________________(1st reading) _____________________(2nd reading) Date: _______________________  a.m. _____________________(1st reading) _____________________(2nd reading)  p.m. _____________________(1st reading) _____________________(2nd reading)

## 2020-08-15 ENCOUNTER — Telehealth: Payer: Medicare Other
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
## 2020-09-03 ENCOUNTER — Ambulatory Visit: Payer: Medicare Other | Admitting: Family Medicine

## 2020-09-04 ENCOUNTER — Ambulatory Visit (INDEPENDENT_AMBULATORY_CARE_PROVIDER_SITE_OTHER): Payer: Medicare Other

## 2020-09-04 ENCOUNTER — Ambulatory Visit (INDEPENDENT_AMBULATORY_CARE_PROVIDER_SITE_OTHER): Payer: Medicare Other | Admitting: Family Medicine

## 2020-09-04 ENCOUNTER — Other Ambulatory Visit: Payer: Self-pay

## 2020-09-04 ENCOUNTER — Encounter: Payer: Self-pay | Admitting: Family Medicine

## 2020-09-04 VITALS — BP 130/76 | HR 72 | Ht 64.0 in | Wt 171.0 lb

## 2020-09-04 DIAGNOSIS — G8929 Other chronic pain: Secondary | ICD-10-CM

## 2020-09-04 DIAGNOSIS — M25562 Pain in left knee: Secondary | ICD-10-CM | POA: Diagnosis not present

## 2020-09-04 DIAGNOSIS — M170 Bilateral primary osteoarthritis of knee: Secondary | ICD-10-CM | POA: Diagnosis not present

## 2020-09-04 DIAGNOSIS — M25561 Pain in right knee: Secondary | ICD-10-CM

## 2020-09-04 NOTE — Patient Instructions (Signed)
Gel injections today Xray today See me again in 2-3 months

## 2020-09-04 NOTE — Assessment & Plan Note (Signed)
Bilateral viscosupplementation given today.  Chronic problem with exacerbation.  Has responded well to the viscosupplementation previously.  Discussed icing regimen.  Discussed home exercises.  Increase activity as tolerated.  Patient will follow up with me again in 2 to 3 months

## 2020-10-12 ENCOUNTER — Telehealth: Payer: Self-pay

## 2020-10-12 NOTE — Chronic Care Management (AMB) (Signed)
Chronic Care Management Pharmacy Assistant   Name: GERALDINA PARROTT  MRN: [id number] DOB: 1946-06-07  Reason for Encounter: Disease State/ Hypertension and Hyperlipidemia Adherence Call  Patient Questions:  1.  Have you seen any other providers since your last visit? Yes, 09/04/2020 [facility] Lyndal Pulley, DO.  2.  Any changes in your medicines or health? No    PCP : Marin Olp, MD  Allergies:   Allergies  Allergen Reactions  . Naprosyn [Naproxen] Swelling  . Terbinafine Hcl Swelling    Lamisil     Medications: Outpatient Encounter Medications as of 10/12/2020  Medication Sig  . Ascorbic Acid (VITAMIN C WITH ROSE HIPS) 500 MG tablet Take 500 mg by mouth daily.  Marland Kitchen atorvastatin (LIPITOR) 10 MG tablet TAKE 1 TABLET BY MOUTH EVERY DAY  . bisoprolol-hydrochlorothiazide (ZIAC) 2.5-6.25 MG tablet TAKE 1 TABLET BY MOUTH EVERY DAY  . Cholecalciferol (VITAMIN D3) 10 MCG (400 UNIT) CAPS Take 10 mcg by mouth.  . clobetasol ointment (TEMOVATE) 0.05 % Apply twice daily to vulva externally  . clotrimazole (LOTRIMIN) 1 % cream Apply topically in the morning and at bedtime.  Marland Kitchen co-enzyme Q-10 30 MG capsule Take 200 mg by mouth daily.  . diclofenac sodium (VOLTAREN) 1 % GEL Apply topically to affected area qid  . EPINEPHrine (EPIPEN JR) 0.15 MG/0.3ML injection Inject 0.15 mg into the muscle as needed.  . hydrocortisone 2.5 % cream Apply topically in the morning and at bedtime.  Marland Kitchen ibuprofen (ADVIL,MOTRIN) 200 MG tablet Take 200 mg by mouth every 6 (six) hours as needed for headache, mild pain or cramping.  Marland Kitchen levothyroxine (SYNTHROID) 75 MCG tablet TAKE 1 TABLET BY MOUTH EVERY DAY  . Multiple Vitamins-Minerals (WOMENS 50+ ADVANCED PO) Take 2 tablets by mouth daily.  Marland Kitchen omeprazole (PRILOSEC) 20 MG capsule TAKE 1 CAPSULE BY MOUTH EVERY DAY  . oxybutynin (DITROPAN-XL) 5 MG 24 hr tablet Take by mouth.  . Saccharomyces boulardii (PROBIOTIC) 250 MG CAPS Take by mouth.  . vitamin  B-12 (CYANOCOBALAMIN) 1000 MCG tablet Take 1,000 mcg by mouth daily.   No facility-administered encounter medications on file as of 10/12/2020.    Current Diagnosis: Patient Active Problem List   Diagnosis Date Noted  . Degenerative arthritis of knee, bilateral 02/18/2019  . Vitamin B 12 deficiency 09/22/2018  . Arthralgia of both knees 11/13/2017  . Trigger finger, left 08/03/2017  . Solitary pulmonary nodule 01/10/2017  . Snoring 06/05/2016  . Excessive daytime sleepiness 06/05/2016  . Shortness of breath 03/11/2016  . Migraine 08/17/2014  . Esophageal reflux 09/03/2010  . Angioedema 06/21/2010  . History of colonic polyps 09/27/2007  . Hyperlipidemia 05/03/2007  . Essential hypertension 05/03/2007  . Hypothyroidism 04/12/2007  . Osteoarthritis 04/12/2007    Reviewed chart prior to disease state call. Spoke with patient regarding BP  Recent Office Vitals: BP Readings from Last 3 Encounters:  09/04/20 130/76  07/02/20 120/78  06/11/20 122/80   Pulse Readings from Last 3 Encounters:  09/04/20 72  07/02/20 64  06/11/20 65    Wt Readings from Last 3 Encounters:  09/04/20 171 lb (77.6 kg)  07/02/20 176 lb (79.8 kg)  06/11/20 175 lb 9.6 oz (79.7 kg)     Kidney Function Lab Results  Component Value Date/Time   CREATININE 0.69 06/11/2020 04:36 PM   CREATININE 0.61 10/21/2019 02:30 PM   CREATININE 0.67 03/23/2019 12:07 PM   GFR 96.42 10/21/2019 02:30 PM   GFRNONAA 87 06/11/2020 04:36 PM  GFRAA 101 06/11/2020 04:36 PM    BMP Latest Ref Rng & Units 06/11/2020 10/21/2019 03/23/2019  Glucose 65 - 99 mg/dL 78 82 94  BUN 7 - 25 mg/dL 14 10 13   Creatinine 0.60 - 0.93 mg/dL 0.69 0.61 0.67  BUN/Creat Ratio 6 - 22 (calc) NOT APPLICABLE - -  Sodium 628 - 146 mmol/L 140 141 141  Potassium 3.5 - 5.3 mmol/L 3.8 3.8 4.2  Chloride 98 - 110 mmol/L 104 103 103  CO2 20 - 32 mmol/L 26 28 27   Calcium 8.6 - 10.4 mg/dL 9.4 9.3 9.3    . Current antihypertensive regimen:   o Bisoprolol-hydrochlorothiazide 2.5-6.25 mg daily  . How often are you checking your Blood Pressure? Patient states she purchased a new blood pressure cuff and has not used it yet. Patient states she has been so busy she has forgotten to monitor her blood pressure.   . Current home BP readings: n/a  . What recent interventions/DTPs have been made by any provider to improve Blood Pressure and Cholesterol since last CPP Visit: Patient states she is taking her medications as directed.  . Any recent hospitalizations or ED visits since last visit with CPP? No , patient has not had any recent hospitalizations or ED visits since her last visit with Madelin Rear, Pharm.D., [facility].  Marland Kitchen What diet changes have been made to improve Blood Pressure and Cholesterol?  o Patient states she tries to avoid sweets and carbohydrates. . What exercise is being done to improve your Blood Pressure and Cholesterol?  o Patient states she exercises about 10-15 minutes three times a week when the weather permits.  Adherence Review: Is the patient currently on ACE/ARB medication? No Does the patient have >5 day gap between last estimated fill dates? No   Lipid Panel    Component Value Date/Time   CHOL 127 10/21/2019 1430   TRIG 91.0 10/21/2019 1430   TRIG 116 06/24/2006 1225   HDL 43.50 10/21/2019 1430   LDLCALC 66 10/21/2019 1430   LDLDIRECT 73.0 03/01/2018 1335    10-year ASCVD risk score: The ASCVD Risk score Mikey Bussing DC Jr., et al., 2013) failed to calculate for the following reasons:   The valid total cholesterol range is 130 to 320 mg/dL  . Current antihyperlipidemic regimen:  o Atorvastatin 10 mg daily  . Previous antihyperlipidemic medications tried: n/a  . ASCVD risk enhancing conditions: age >59 and HTN   Adherence Review: Does the patient have >5 day gap between last estimated fill dates? No  Patient states her Acid Reflux has not been well controlled lately. Patient states she has been taking  some Pepto-Bismol right before bed to help calm down her symptoms. Patient states she understands certain triggers to avoid chocolate, citrus juices, fatty foods and lying down after eating. However, patient admits she loves chocolate and eats oranges almost daily. Patient states she tries to eat oranges earlier in the day. Is there anything else that you recommend to help with her Acid Reflux in addition to Omeprazole 20 mg daily and Pepto-Bismol at bedtime?  April D Calhoun, [place] Pharmacist Assistant [phone]    Follow-Up:  Pharmacist Review

## 2020-10-16 ENCOUNTER — Telehealth: Payer: Self-pay

## 2020-10-16 NOTE — Telephone Encounter (Cosign Needed)
I called and spoke with the patient, she is aware and verbally understands. AC/CMA 

## 2020-10-16 NOTE — Telephone Encounter (Signed)
Recommend patient stop pepto-bismol.  As a preferred alternative to pepto-bismol, she can try OTC pepcid (famotidine) - take 10-20 mg twice daily as needed (40 mg/day) in addition to current omeprazole. Also if having symptoms I would recommend for her not to take ibuprofen or any OTC oral pain meds with exception of tylenol.

## 2020-10-16 NOTE — Telephone Encounter (Signed)
-----   Message from April D Calhoun sent at 10/15/2020 11:56 AM EST ----- Patient states her Acid Reflux has not been well controlled lately. Patient states she has been taking some Pepto-Bismol right before bed to help calm down her symptoms. Patient states she understands certain triggers to avoid chocolate, citrus juices, fatty foods and lying down after eating. However, patient admits she loves chocolate and eats oranges almost daily. Patient states she tries to eat oranges earlier in the day. Is there anything else that you recommend to help with her Acid Reflux in addition to Omeprazole 20 mg daily and Pepto-Bismol at bedtime?

## 2020-10-24 ENCOUNTER — Telehealth: Payer: Self-pay | Admitting: Family Medicine

## 2020-10-24 NOTE — Telephone Encounter (Signed)
Left message for patient to call back and schedule Medicare Annual Wellness Visit (AWV) either virtually OR in office.   Last AWV 09/02/19; please schedule at anytime with [facility] at [facility].  This should be a 45 minute visit.

## 2020-11-14 ENCOUNTER — Ambulatory Visit: Payer: Medicare Other | Admitting: Family Medicine
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
## 2020-12-11 NOTE — Patient Instructions (Addendum)
  Depression screen [facility] 2/9 12/13/2020 04/25/2020 10/21/2019  Decreased Interest 0 0 0  Down, Depressed, Hopeless 0 0 0  PHQ - 2 Score 0 0 0  Altered sleeping - - 0  Tired, decreased energy - - 1  Change in appetite - - 0  Feeling bad or failure about yourself  - - 0  Trouble concentrating - - 0  Moving slowly or fidgety/restless - - 0  Suicidal thoughts - - 0  PHQ-9 Score - - 1  Difficult doing work/chores - - Not difficult at all    Recommended follow up: No follow-ups on file.

## 2020-12-13 ENCOUNTER — Telehealth (INDEPENDENT_AMBULATORY_CARE_PROVIDER_SITE_OTHER): Payer: Medicare Other | Admitting: Family Medicine

## 2020-12-13 ENCOUNTER — Encounter: Payer: Self-pay | Admitting: Family Medicine

## 2020-12-13 ENCOUNTER — Other Ambulatory Visit: Payer: Self-pay

## 2020-12-13 DIAGNOSIS — K219 Gastro-esophageal reflux disease without esophagitis: Secondary | ICD-10-CM | POA: Diagnosis not present

## 2020-12-13 DIAGNOSIS — E785 Hyperlipidemia, unspecified: Secondary | ICD-10-CM

## 2020-12-13 DIAGNOSIS — I10 Essential (primary) hypertension: Secondary | ICD-10-CM | POA: Diagnosis not present

## 2020-12-13 DIAGNOSIS — E538 Deficiency of other specified B group vitamins: Secondary | ICD-10-CM

## 2020-12-13 DIAGNOSIS — E039 Hypothyroidism, unspecified: Secondary | ICD-10-CM | POA: Diagnosis not present

## 2020-12-13 MED ORDER — OMEPRAZOLE 20 MG PO CPDR: DELAYED_RELEASE_CAPSULE | ORAL | 3 refills | Status: DC

## 2020-12-19 ENCOUNTER — Other Ambulatory Visit: Payer: Self-pay

## 2020-12-19 ENCOUNTER — Emergency Department (HOSPITAL_COMMUNITY): Payer: Medicare Other

## 2020-12-19 ENCOUNTER — Emergency Department (HOSPITAL_COMMUNITY)
Admission: EM | Admit: 2020-12-19 | Discharge: 2020-12-19 | Disposition: A | Discharge status: Home / Self Care | Payer: Medicare Other | Attending: Emergency Medicine | Admitting: Emergency Medicine

## 2020-12-19 DIAGNOSIS — S8001XA Contusion of right knee, initial encounter: Secondary | ICD-10-CM | POA: Diagnosis not present

## 2020-12-19 DIAGNOSIS — R42 Dizziness and giddiness: Secondary | ICD-10-CM | POA: Diagnosis not present

## 2020-12-19 DIAGNOSIS — H5712 Ocular pain, left eye: Secondary | ICD-10-CM | POA: Insufficient documentation

## 2020-12-19 DIAGNOSIS — W19XXXA Unspecified fall, initial encounter: Secondary | ICD-10-CM | POA: Diagnosis not present

## 2020-12-19 DIAGNOSIS — M25511 Pain in right shoulder: Secondary | ICD-10-CM | POA: Diagnosis not present

## 2020-12-19 DIAGNOSIS — S20211A Contusion of right front wall of thorax, initial encounter: Secondary | ICD-10-CM | POA: Diagnosis not present

## 2020-12-19 DIAGNOSIS — S20213A Contusion of bilateral front wall of thorax, initial encounter: Secondary | ICD-10-CM | POA: Diagnosis not present

## 2020-12-19 DIAGNOSIS — S8002XA Contusion of left knee, initial encounter: Secondary | ICD-10-CM | POA: Insufficient documentation

## 2020-12-19 DIAGNOSIS — Z859 Personal history of malignant neoplasm, unspecified: Secondary | ICD-10-CM | POA: Insufficient documentation

## 2020-12-19 DIAGNOSIS — S8000XA Contusion of unspecified knee, initial encounter: Secondary | ICD-10-CM

## 2020-12-19 DIAGNOSIS — Z79899 Other long term (current) drug therapy: Secondary | ICD-10-CM | POA: Diagnosis not present

## 2020-12-19 DIAGNOSIS — M79674 Pain in right toe(s): Secondary | ICD-10-CM | POA: Insufficient documentation

## 2020-12-19 DIAGNOSIS — S0990XA Unspecified injury of head, initial encounter: Secondary | ICD-10-CM | POA: Insufficient documentation

## 2020-12-19 DIAGNOSIS — Z043 Encounter for examination and observation following other accident: Secondary | ICD-10-CM | POA: Diagnosis not present

## 2020-12-19 DIAGNOSIS — M25561 Pain in right knee: Secondary | ICD-10-CM | POA: Diagnosis not present

## 2020-12-19 DIAGNOSIS — W108XXA Fall (on) (from) other stairs and steps, initial encounter: Secondary | ICD-10-CM | POA: Diagnosis not present

## 2020-12-19 DIAGNOSIS — I10 Essential (primary) hypertension: Secondary | ICD-10-CM | POA: Insufficient documentation

## 2020-12-19 DIAGNOSIS — E039 Hypothyroidism, unspecified: Secondary | ICD-10-CM | POA: Diagnosis not present

## 2020-12-19 DIAGNOSIS — S299XXA Unspecified injury of thorax, initial encounter: Secondary | ICD-10-CM | POA: Diagnosis present

## 2020-12-19 DIAGNOSIS — M25562 Pain in left knee: Secondary | ICD-10-CM | POA: Diagnosis not present

## 2020-12-19 DIAGNOSIS — M79675 Pain in left toe(s): Secondary | ICD-10-CM | POA: Diagnosis not present

## 2020-12-19 MED ORDER — HYDROCODONE-ACETAMINOPHEN 5-325 MG PO TABS
1.0000 | ORAL_TABLET | Freq: Once | ORAL | Status: AC
  Administered 2020-12-19: 1 via ORAL
  Filled 2020-12-19: qty 1

## 2020-12-19 NOTE — ED Provider Notes (Signed)
[facility] Provider Note   CSN: [id number] Arrival date & time: 12/19/20  1478     History Chief Complaint  Patient presents with  . Fall    Kelly Frazier is a 75 y.o. female.  Pt complains of mechanical fall down 8 stairs today, not anticoagulated, no LOC, pain behind left eye, both knees, right scapula, left great toe.         Past Medical History:  Diagnosis Date  . Allergy   . Arthritis    OA  . Cancer ([facility]) 1980's   basal cell carcinoma  . Cataract    bilateral 2 yrs ago   . Diverticulosis   . Excessive daytime sleepiness 06/05/2016  . GERD (gastroesophageal reflux disease)   . Head injury due to trauma    s/p attack - some memory loss   . Headache(784.0)   . Hx of colonic polyps   . Hyperlipidemia    no meds   . Hypertension    pt denies- on  meds   . Thyroid disease     Patient Active Problem List   Diagnosis Date Noted  . Degenerative arthritis of knee, bilateral 02/18/2019  . Vitamin B 12 deficiency 09/22/2018  . Arthralgia of both knees 11/13/2017  . Trigger finger, left 08/03/2017  . Solitary pulmonary nodule 01/10/2017  . Snoring 06/05/2016  . Excessive daytime sleepiness 06/05/2016  . Shortness of breath 03/11/2016  . Migraine 08/17/2014  . Esophageal reflux 09/03/2010  . Angioedema 06/21/2010  . History of colonic polyps 09/27/2007  . Hyperlipidemia 05/03/2007  . Essential hypertension 05/03/2007  . Hypothyroidism 04/12/2007  . Osteoarthritis 04/12/2007    Past Surgical History:  Procedure Laterality Date  . AXILLARY ABCESS IRRIGATION AND DEBRIDEMENT     cyst  . bcca from eyelid     basal cell 1980s.   Marland Kitchen BREAST BIOPSY  2006   and 2017  . CATARACT EXTRACTION, BILATERAL    . COLONOSCOPY  08/17/06  . NSVD  1971   x1  . osseous implant    . POLYPECTOMY    . removal of moles and denmatoid       OB History   No obstetric history on file.     Family History  Problem Relation Age of  Onset  . Cancer Mother        lung- nonsmoker. in her 76s, perhaps related to job  . Hypertension Mother   . Lung cancer Mother   . Liver disease Father   . Alcohol abuse Father   . Hypertension Father   . Ovarian cancer Maternal Grandmother   . Hodgkin's lymphoma Maternal Grandfather   . Dementia Paternal Grandmother   . Tuberculosis Paternal Grandfather   . Colon cancer Maternal Aunt   . Colon cancer Maternal Uncle   . Colon polyps Neg Hx   . Esophageal cancer Neg Hx   . Rectal cancer Neg Hx   . Stomach cancer Neg Hx     Social History   Tobacco Use  . Smoking status: Never Smoker  . Smokeless tobacco: Never Used  Vaping Use  . Vaping Use: Never used  Substance Use Topics  . Alcohol use: Yes    Alcohol/week: 2.0 standard drinks    Types: 2 Glasses of wine per week  . Drug use: No    Home Medications Prior to Admission medications   Medication Sig Start Date End Date Taking? Authorizing Provider  Ascorbic Acid (VITAMIN C WITH ROSE  HIPS) 500 MG tablet Take 500 mg by mouth daily.    [provider]  atorvastatin (LIPITOR) 10 MG tablet TAKE 1 TABLET BY MOUTH EVERY DAY 04/04/20   Marin Olp, MD  bisoprolol-hydrochlorothiazide [facility]) 2.5-6.25 MG tablet TAKE 1 TABLET BY MOUTH EVERY DAY 03/05/20   Marin Olp, MD  CALCIUM-MAGNESIUM-ZINC PO Take by mouth.    [provider]  Cholecalciferol (VITAMIN D3) 10 MCG (400 UNIT) CAPS Take 10 mcg by mouth.    [provider]  clobetasol ointment (TEMOVATE) 0.05 % Apply twice daily to vulva externally Patient not taking: Reported on 12/13/2020 04/01/18   [provider]  clotrimazole (LOTRIMIN) 1 % cream Apply topically in the morning and at bedtime. Patient not taking: Reported on 12/13/2020 06/08/20   [provider]  co-enzyme Q-10 30 MG capsule Take 200 mg by mouth daily.    [provider]  diclofenac sodium (VOLTAREN) 1 % GEL Apply topically to affected area qid 03/23/19    Marin Olp, MD  EPINEPHrine [facility]) 0.15 MG/0.3ML injection Inject 0.15 mg into the muscle as needed. Patient not taking: Reported on 12/13/2020    [provider]  famotidine (PEPCID) 20 MG tablet Take 20 mg by mouth 2 (two) times daily.    [provider]  hydrocortisone 2.5 % cream Apply topically in the morning and at bedtime. 06/08/20   [provider]  ibuprofen (ADVIL,MOTRIN) 200 MG tablet Take 200 mg by mouth every 6 (six) hours as needed for headache, mild pain or cramping. Patient not taking: Reported on 12/13/2020    [provider]  levothyroxine (SYNTHROID) 75 MCG tablet TAKE 1 TABLET BY MOUTH EVERY DAY 04/04/20   Marin Olp, MD  Multiple Vitamins-Minerals (WOMENS 50+ ADVANCED PO) Take 2 tablets by mouth daily.    [provider]  omeprazole (PRILOSEC) 20 MG capsule TAKE 1-2 CAPSULEs BY MOUTH EVERY DAY 12/13/20   Marin Olp, MD  oxybutynin (DITROPAN-XL) 5 MG 24 hr tablet Take by mouth. 04/01/18   [provider]  Saccharomyces boulardii (PROBIOTIC) 250 MG CAPS Take by mouth.    [provider]  vitamin B-12 (CYANOCOBALAMIN) 1000 MCG tablet Take 1,000 mcg by mouth daily.    [provider]    Allergies    Naprosyn [naproxen] and Terbinafine hcl  Review of Systems   Review of Systems  Constitutional: Negative for fever.  Respiratory: Negative for shortness of breath.   Cardiovascular: Negative for chest pain.  Gastrointestinal: Negative for abdominal pain, nausea and vomiting.  Musculoskeletal: Positive for arthralgias, joint swelling and myalgias. Negative for neck pain.  Skin: Positive for wound.  Allergic/Immunologic: Negative for immunocompromised state.  Neurological: Positive for headaches. Negative for weakness.  Hematological: Does not bruise/bleed easily.  Psychiatric/Behavioral: Negative for confusion.  All other systems reviewed and are negative.   Physical Exam Updated  Vital Signs BP (!) 146/88   Pulse 79   Temp 97.6 F (36.4 C) (Oral)   Resp (!) 29   SpO2 98%   Physical Exam Vitals and nursing note reviewed.  Constitutional:      General: She is not in acute distress.    Appearance: She is well-developed. She is not diaphoretic.  HENT:     Head: Normocephalic and atraumatic.  Eyes:     Extraocular Movements: Extraocular movements intact.     Pupils: Pupils are equal, round, and reactive to light.  Cardiovascular:     Rate and Rhythm: Normal  rate and regular rhythm.     Pulses: Normal pulses.     Heart sounds: Normal heart sounds.  Pulmonary:     Effort: Pulmonary effort is normal.     Breath sounds: Normal breath sounds.  Abdominal:     Palpations: Abdomen is soft.     Tenderness: There is no abdominal tenderness.  Musculoskeletal:        General: Swelling, tenderness and signs of injury present. No deformity.     Cervical back: Normal range of motion and neck supple. No tenderness or bony tenderness.     Thoracic back: No tenderness or bony tenderness.     Lumbar back: No tenderness or bony tenderness.     Comments: Mild swelling and bruising to knees, left more so than right. Mild tenderness to left great toe. Mild tenderness right scapula/right axillary ribs.   Skin:    General: Skin is warm and dry.     Findings: No erythema or rash.  Neurological:     Mental Status: She is alert and oriented to person, place, and time.     Cranial Nerves: No cranial nerve deficit.     Sensory: No sensory deficit.     Motor: No weakness.  Psychiatric:        Behavior: Behavior normal.     ED Results / Procedures / Treatments   Labs (all labs ordered are listed, but only abnormal results are displayed) Labs Reviewed - No data to display  EKG None  Radiology DG Ribs Unilateral W/Chest Right  Result Date: 12/19/2020 CLINICAL DATA:  Fall. EXAM: RIGHT RIBS AND CHEST - 3+ VIEW COMPARISON:  February 28, 2016. FINDINGS: No fracture or other bone  lesions are seen involving the ribs. There is no evidence of pneumothorax or pleural effusion. Both lungs are clear. Heart size and mediastinal contours are within normal limits. IMPRESSION: Negative. Electronically Signed   By: Marijo Conception M.D.   On: 12/19/2020 11:01   DG Scapula Right  Result Date: 12/19/2020 CLINICAL DATA:  Right shoulder pain after fall. EXAM: RIGHT SCAPULA - 2+ VIEWS COMPARISON:  None. FINDINGS: There is no evidence of fracture or other focal bone lesions. Soft tissues are unremarkable. IMPRESSION: Negative. Electronically Signed   By: Marijo Conception M.D.   On: 12/19/2020 10:56   CT Head Wo Contrast  Result Date: 12/19/2020 CLINICAL DATA:  Head trauma EXAM: CT HEAD WITHOUT CONTRAST TECHNIQUE: Contiguous axial images were obtained from the base of the skull through the vertex without intravenous contrast. COMPARISON:  Head CT December 06, 2014 FINDINGS: Brain: No evidence of acute large vascular territory infarction, hemorrhage, hydrocephalus, extra-axial collection or mass lesion/mass effect. Vascular: No hyperdense vessel or unexpected calcification. Skull: Hyperostosis frontalis. Negative for fracture or focal lesion. Sinuses/Orbits: Paranasal sinuses and mastoid air cells are predominantly clear. Orbits are grossly unremarkable. Other: None IMPRESSION: No acute intracranial findings. Electronically Signed   By: Dahlia Bailiff MD   On: 12/19/2020 11:59   DG Knee Complete 4 Views Left  Result Date: 12/19/2020 CLINICAL DATA:  Left knee pain after fall. EXAM: LEFT KNEE - COMPLETE 4+ VIEW COMPARISON:  September 04, 2020. FINDINGS: No evidence of fracture, dislocation, or joint effusion. No evidence of arthropathy or other focal bone abnormality. Soft tissues are unremarkable. IMPRESSION: Negative. Electronically Signed   By: Marijo Conception M.D.   On: 12/19/2020 10:52   DG Knee Complete 4 Views Right  Result Date: 12/19/2020 CLINICAL DATA:  Right knee pain after  fall. EXAM: RIGHT  KNEE - COMPLETE 4+ VIEW COMPARISON:  None. FINDINGS: No evidence of fracture, dislocation, or joint effusion. No evidence of arthropathy or other focal bone abnormality. Soft tissues are unremarkable. IMPRESSION: Negative. Electronically Signed   By: Marijo Conception M.D.   On: 12/19/2020 10:53   DG Toe Great Left  Result Date: 12/19/2020 CLINICAL DATA:  Left great toe pain after fall. EXAM: LEFT GREAT TOE COMPARISON:  None. FINDINGS: There is no evidence of fracture or dislocation. There is no evidence of arthropathy or other focal bone abnormality. Soft tissues are unremarkable. IMPRESSION: Negative. Electronically Signed   By: Marijo Conception M.D.   On: 12/19/2020 10:55    Procedures Procedures   Medications Ordered in ED Medications  HYDROcodone-acetaminophen (NORCO/VICODIN) 5-325 MG per tablet 1 tablet (has no administration in time range)    ED Course  I have reviewed the triage vital signs and the nursing notes.  Pertinent labs & imaging results that were available during my care of the patient were reviewed by me and considered in my medical decision making (see chart for details).  Clinical Course as of 12/19/20 1801  Wed Dec 20, 5350  6825 75 year old female presents for evaluation after mechanical fall down steps reports pain in both of her knees, left great toe, right scapula and right rib area.  Also states that she has pain behind her left eye/headache.  On exam found to have mild swelling and tenderness with ecchymosis of her knees, mild tenderness of the left great toe, tenderness along her right mid axillary chest wall and right scapula without ecchymosis or crepitus.  Neuro exam unremarkable.  Imaging obtained of these areas and does not identify acute injury. Patient was given an ice pack for her knees as well as a hydrocodone for her pain.  Patient is requesting a walker and we will try to arrange this prior to discharge.  Recommend follow-up with her doctor for recheck in 2  days. [LM]    Clinical Course User Index [LM] Roque Lias   MDM Rules/Calculators/A&P                          Final Clinical Impression(s) / ED Diagnoses Final diagnoses:  Fall down stairs, initial encounter  Contusion of right chest wall, initial encounter  Contusion of knee, unspecified laterality, initial encounter    Rx / DC Orders ED Discharge Orders    None       Roque Lias 12/19/20 Maryclare Labrador, MD 12/19/20 2303

## 2020-12-19 NOTE — Discharge Instructions (Signed)
Follow up with your doctor for recheck in 2 days. Return to the ER for severe or concerning symptoms.  Take Tylenol as needed as directed. Apply cold compresses to your knees for 20 minutes at a time for the first 1-2 days after injury. Then use warm compresses for 20 minutes.   Use warm compresses on sore muscles in neck and back for 20 minutes at time.

## 2020-12-19 NOTE — ED Triage Notes (Signed)
Emergency Medicine Provider Triage Evaluation Note  Kelly Frazier , a 75 y.o. female  was evaluated in triage.  Pt complains of mechanical fall down 8 stairs today, not anticoagulated, no LOC, pain behind left eye, both knees, right scapula, left great toe.  Review of Systems  Positive: Joint pain Negative: LOC, anticoagulated  Physical Exam  BP (!) 188/79 (BP Location: Right Arm)   Pulse 77   Temp 97.9 F (36.6 C) (Oral)   Resp 17   SpO2 97%  Gen:   Awake, no distress   HEENT:  Atraumatic  Resp:  Normal effort Cardiac:  Normal rate  Abd:   Nondistended, nontender  MSK:   Moves extremities without difficulty, mild swelling, ecchymosis to both knees, TTP left great toe, right scapula Neuro:  Speech clear   Medical Decision Making  Medically screening exam initiated at 9:36 AM.  Appropriate orders placed.  Kelly Frazier was informed that the remainder of the evaluation will be completed by another provider, this initial triage assessment does not replace that evaluation, and the importance of remaining in the ED until their evaluation is complete.  Clinical Impression  Kelly Frazier injury   Roque Lias 12/19/20 [phone]

## 2020-12-19 NOTE — ED Triage Notes (Signed)
[facility] Pt fell down 4 steps. Pt denies any LOC or head injuries. Pt c/o bilateral knee pain & left thigh pain. Pt is not on any blood thinners.

## 2020-12-19 NOTE — ED Notes (Signed)
Patient verbalizes understanding of discharge instructions. Opportunity for questioning and answers were provided. Case management provided pt with walker and instructions to use it. Rn also provided information on how to use walker. Armband removed by staff, pt discharged from ED via wheelchair. Writer assisted pt into the car with family members present.

## 2020-12-20 ENCOUNTER — Ambulatory Visit (INDEPENDENT_AMBULATORY_CARE_PROVIDER_SITE_OTHER): Payer: Medicare Other | Admitting: Family Medicine

## 2020-12-20 ENCOUNTER — Other Ambulatory Visit: Payer: Self-pay

## 2020-12-20 ENCOUNTER — Encounter: Payer: Self-pay | Admitting: Family Medicine

## 2020-12-20 DIAGNOSIS — M25561 Pain in right knee: Secondary | ICD-10-CM | POA: Diagnosis not present

## 2020-12-20 DIAGNOSIS — M170 Bilateral primary osteoarthritis of knee: Secondary | ICD-10-CM | POA: Diagnosis not present

## 2020-12-20 DIAGNOSIS — M25562 Pain in left knee: Secondary | ICD-10-CM | POA: Diagnosis not present

## 2020-12-20 MED ORDER — AMBULATORY NON FORMULARY MEDICATION: 1.0000 [IU] | Freq: Once | 0 refills | Status: AC

## 2020-12-20 NOTE — Progress Notes (Signed)
Virtual Visit via Video Note  I connected with Kelly Frazier on 12/20/20 at 12:45 PM EDT by a video enabled telemedicine application and verified that I am speaking with the correct person using two identifiers.  Difficulty with virtual platform so changed to phone call. Patient is on the phone call alone.   Location: Patient: Patient is at home setting Provider: In office setting   I discussed the limitations of evaluation and management by telemedicine and the availability of in person appointments. The patient expressed understanding and agreed to proceed.  History of Present Illness: 75 year old female who fell yesterday down 8 stairs.  No loss of consciousness.  Had significant pain now behind the left knee, both eyes, both knees right scapular and the left toe.  Went to the emergency room.  Patient at the time of presentation did have some bruising of her knees and neuro exam was unremarkable.  Patient did have imaging done.  Knee x-rays of both the right and left knee were independently visualized by me showing no significant bony abnormality or any acute fracture same findings of the great toe on the left side.  Rib series was also negative for any type of bony abnormality CT scan of the head was unremarkable.   Observations/Objective: Alert, sound comfortable    Assessment and Plan: 75 year old female who did fall down her stairs having difficulty with transferring.  Having difficulty getting on and off the commode being the most concerning portion at this point.  Patient does feel a little bit better with walking today but continuing to have trouble with the pushing up as well as the knee pain bilaterally left greater than right.  Discussed with patient that we will attempt to try to get her a bedside commode and an elevated toilet seat from a home health placed to help her at this time.  Patient does have some pain medicines from the emergency room.  Discussed with patient that  if worsening pain over the weekend she needs to seek medical attention immediately but the patient does have a follow-up scheduled in 6 days.   Follow Up Instructions: 6-day follow-up    I discussed the assessment and treatment plan with the patient. The patient was provided an opportunity to ask questions and all were answered. The patient agreed with the plan and demonstrated an understanding of the instructions.   The patient was advised to call back or seek an in-person evaluation if the symptoms worsen or if the condition fails to improve as anticipated.  I provided 30 minutes of non-face-to-face time during this encounter including reviewing imaging and as well as emergency room visit.Lyndal Pulley, DO

## 2020-12-21 ENCOUNTER — Telehealth: Payer: Self-pay

## 2020-12-21 ENCOUNTER — Other Ambulatory Visit: Payer: Self-pay

## 2020-12-21 ENCOUNTER — Telehealth (INDEPENDENT_AMBULATORY_CARE_PROVIDER_SITE_OTHER): Payer: Medicare Other | Admitting: Physician Assistant

## 2020-12-21 DIAGNOSIS — M170 Bilateral primary osteoarthritis of knee: Secondary | ICD-10-CM

## 2020-12-21 DIAGNOSIS — S8001XS Contusion of right knee, sequela: Secondary | ICD-10-CM | POA: Diagnosis not present

## 2020-12-21 DIAGNOSIS — R29898 Other symptoms and signs involving the musculoskeletal system: Secondary | ICD-10-CM

## 2020-12-21 DIAGNOSIS — W108XXD Fall (on) (from) other stairs and steps, subsequent encounter: Secondary | ICD-10-CM

## 2020-12-21 DIAGNOSIS — S8002XS Contusion of left knee, sequela: Secondary | ICD-10-CM

## 2020-12-21 NOTE — Patient Instructions (Signed)
You may continue to take Tylenol on a scheduled basis for pain.  I recommend 1000 mg at breakfast, lunch, and dinner.  I am going to work on getting advanced home care out there for an assessment for you today.

## 2020-12-21 NOTE — Telephone Encounter (Signed)
Pt called in checking on her home health referral. I told her we put it in and made it urgent. Pt stated she was concerned about having to be alone. She asked me if I thought her friend should spend the night. I told pt that would be up to what she felt comfortable with or if she felt safe enough to stay alone. Pt was rather dissatisfied that we were unable to get her home health out this weekend to sit with her. After the call I ran the dilemma by Tammy. Dr. Yong Channel weighed in and said to let pt know that she will not be able to get home health out there this weekend and that they don't offer services where they will stay with her all weekend. He stated pt's 2 options were to have a friend stay with her or to go to the ED. I called pt back and relayed Dr. Ansel Bong message. She expressed frustration. She said she was at the ED for her previous fall from 8 am to 7 pm and that she just cannot do that again. Pt wanted me to tell Dr. Yong Channel thank you and that we have to keep pushing for universal healthcare.

## 2020-12-21 NOTE — Progress Notes (Signed)
Virtual Visit via Video Note  I connected with Kelly Frazier on 12/21/20 at  9:00 AM EDT by a video enabled telemedicine application and verified that I am speaking with the correct person using two identifiers.  Location: Patient: home Provider: Therapist, music at [employer]   I discussed the limitations of evaluation and management by telemedicine and the availability of in person appointments. The patient expressed understanding and agreed to proceed.   Only the patient and myself were present for today's video call.   History of Present Illness: Pleasant 75 year old patient presents for video visit follow-up from the emergency department visit on 12/19/2020.  It looks like she had a mechanical fall down around 9 steps and was complaining of pain in her knees, left great toe, right scapula, and right rib.  She did not have loss of consciousness and she is not anticoagulated.  She was also complaining of pain behind her left eye and headache.  X-rays were done of these areas and found to be negative, as well as a CT of the head without contrast, which was also negative.  She says she is still in a lot of pain. She is taking Tylenol 2500 mg daily for pain. Transfer from walker to the commode is most difficult for her, but she is having trouble with almost all transfers. Needs extension with grab rails for the commode.  Unable to perform ADLs on her own - states she cannot get dressed, shower, prepare meals, or get in and out of the bed on her own. Still feels weak in her legs.   Several neighbors and friends have been helping her in the interim.  She does not normally have falls. She does not have a history of osteoporosis. Last DEXA done on 04-30-11.    Observations/Objective:  Laying down in her bed for video visit.  Gen: Awake, alert, no acute distress Resp: Breathing is even and non-labored Psych: calm/pleasant demeanor Neuro: Alert and Oriented x 3, + facial  symmetry, speech is clear.   Assessment and Plan: 1. Fall down stairs, subsequent encounter 2. Primary osteoarthritis of both knees 3. Contusion of right knee, sequela 4. Contusion of left knee, sequela 5. Weakness of both legs Patient had a fall down approximately 9 steps on 12/19/2020.  I personally reviewed the emergency department report from 12/19/2020.  Since then, she is still unable to perform most ADLs.  She is also having tremendous difficulty with transfers.  She would definitely benefit from a commode extension, as well as home health care aides, and PT and OT assessment at this time.  I am going to get her set up with advanced home care and try to get this in motion for her before the weekend starts.  She is very alert and oriented and therefore I am not concerned about any delayed head injury or concussion.  She is mostly weak just from the fall, but this did exacerbate the pain that she already had from the arthritis in her knees.   Follow Up Instructions:    I discussed the assessment and treatment plan with the patient. The patient was provided an opportunity to ask questions and all were answered. The patient agreed with the plan and demonstrated an understanding of the instructions.   The patient was advised to call back or seek an in-person evaluation if the symptoms worsen or if the condition fails to improve as anticipated.  This note was prepared with assistance of Dragon voice recognition  software. Occasional wrong-word or sound-a-like substitutions may have occurred due to the inherent limitations of voice recognition software.  Kelly Frazier Kelly [name], PA-C

## 2020-12-24 ENCOUNTER — Telehealth: Payer: Self-pay

## 2020-12-24 DIAGNOSIS — S8001XS Contusion of right knee, sequela: Secondary | ICD-10-CM | POA: Diagnosis not present

## 2020-12-24 DIAGNOSIS — M170 Bilateral primary osteoarthritis of knee: Secondary | ICD-10-CM | POA: Diagnosis not present

## 2020-12-24 DIAGNOSIS — G8911 Acute pain due to trauma: Secondary | ICD-10-CM | POA: Diagnosis not present

## 2020-12-24 DIAGNOSIS — R29898 Other symptoms and signs involving the musculoskeletal system: Secondary | ICD-10-CM | POA: Diagnosis not present

## 2020-12-24 DIAGNOSIS — M6281 Muscle weakness (generalized): Secondary | ICD-10-CM | POA: Diagnosis not present

## 2020-12-24 DIAGNOSIS — E538 Deficiency of other specified B group vitamins: Secondary | ICD-10-CM | POA: Diagnosis not present

## 2020-12-24 DIAGNOSIS — S8002XS Contusion of left knee, sequela: Secondary | ICD-10-CM | POA: Diagnosis not present

## 2020-12-24 DIAGNOSIS — W108XXD Fall (on) (from) other stairs and steps, subsequent encounter: Secondary | ICD-10-CM | POA: Diagnosis not present

## 2020-12-24 DIAGNOSIS — I10 Essential (primary) hypertension: Secondary | ICD-10-CM | POA: Diagnosis not present

## 2020-12-24 NOTE — Telephone Encounter (Signed)
Please advise 

## 2020-12-24 NOTE — Telephone Encounter (Signed)
I am okay with verbal orders but patient will need a face-to-face visit-virtual okay-within 30 days of start of therapy

## 2020-12-24 NOTE — Telephone Encounter (Signed)
Betsy with [facility] is requesting orders for physical therapy for   2x a week for 3 weeks 1x a week for 2 weeks  Order for bedside commode ( faxed to medical supply company)   [phone] . Ok to leave voicemail

## 2020-12-25 NOTE — Progress Notes (Signed)
[facility] [address] [place] Phone: [phone] Subjective:   I Kelly Frazier am serving as a Education administrator for Dr. Hulan Saas. This visit occurred during the SARS-CoV-2 public health emergency.  Safety protocols were in place, including screening questions prior to the visit, additional usage of staff PPE, and extensive cleaning of exam room while observing appropriate contact time as indicated for disinfecting solutions.   I'm seeing this patient by the request  of:  Marin Olp, MD  CC: knee pain follow up   [id number]  Kelly Frazier is a 75 y.o. female coming in with complaint of B knee pain. Patient fell down stairs on 12/19/2020.  Seen in the emergency room.  Initially a phone visit.  Attempted to get her DME but patient was unable to get it.  Patient was given DME. A week ago she fell down some concrete stairs. States she has abrasions and bruises. Knees were severly "banged up". Bilateral shoulder pain that feels like pulled muscles. Hit her left shoulder blade and ribs. States there is a lot of pain there. States she needs a medical aid and some DME equipment. Would like someone to help her once a day. States she hasn't been able to make it to the bathroom so she has to wear diapers.   Needs a toilet seat extension and grab bars for her toilet. Needs a shower chair. Needs a commode. Needs a foot stool with hand rails. Needs a handicap placard.   Reviewed the imaging and likely there was no significant acute fractures noted.  Past Medical History:  Diagnosis Date  . Allergy   . Arthritis    OA  . Cancer ([place]) 1980's   basal cell carcinoma  . Cataract    bilateral 2 yrs ago   . Diverticulosis   . Excessive daytime sleepiness 06/05/2016  . GERD (gastroesophageal reflux disease)   . Head injury due to trauma    s/p attack - some memory loss   . Headache(784.0)   . Hx of colonic polyps   . Hyperlipidemia    no meds   .  Hypertension    pt denies- on  meds   . Thyroid disease    Past Surgical History:  Procedure Laterality Date  . AXILLARY ABCESS IRRIGATION AND DEBRIDEMENT     cyst  . bcca from eyelid     basal cell 1980s.   Marland Kitchen BREAST BIOPSY  2006   and 2017  . CATARACT EXTRACTION, BILATERAL    . COLONOSCOPY  08/17/06  . NSVD  1971   x1  . osseous implant    . POLYPECTOMY    . removal of moles and denmatoid     Social History   Socioeconomic History  . Marital status: Single    Spouse name: Not on file  . Number of children: Not on file  . Years of education: Not on file  . 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 Smoking status: Never Smoker  . Smokeless tobacco: Never Used  Vaping Use  . Vaping Use: Never used  Substance and Sexual Activity  . Alcohol use: Yes    Alcohol/week: 2.0 standard drinks    Types: 2 Glasses of wine per week  . Drug use: No  . Sexual activity: Yes  Other Topics Concern  . Not  on file  Social History Narrative   Divorced in 45s. 1 son. 2 twin grandkids age 61 in 24- in [place].       Retired from [place].       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    Social Determinants of Radio broadcast assistant Strain: Not on file  Food Insecurity: No Food Insecurity  . Worried About Charity fundraiser in the Last Year: Never true  . Ran Out of Food in the Last Year: Never true  Transportation Needs: Not on file  Physical Activity: Not on file  Stress: Not on file  Social Connections: Not on file   Allergies  Allergen Reactions  . Naprosyn [Naproxen] Swelling  . Terbinafine Hcl Swelling    Lamisil    Family History  Problem Relation Age of Onset  . Cancer Mother        lung- nonsmoker. in her 6s, perhaps related to job  . Hypertension Mother   . Lung cancer Mother   . Liver disease Father   . Alcohol abuse Father   . Hypertension Father   .  Ovarian cancer Maternal Grandmother   . Hodgkin's lymphoma Maternal Grandfather   . Dementia Paternal Grandmother   . Tuberculosis Paternal Grandfather   . Colon cancer Maternal Aunt   . Colon cancer Maternal Uncle   . Colon polyps Neg Hx   . Esophageal cancer Neg Hx   . Rectal cancer Neg Hx   . Stomach cancer Neg Hx     Current Outpatient Medications (Endocrine & Metabolic):  .  levothyroxine (SYNTHROID) 75 MCG tablet, TAKE 1 TABLET BY MOUTH EVERY DAY  Current Outpatient Medications (Cardiovascular):  .  atorvastatin (LIPITOR) 10 MG tablet, TAKE 1 TABLET BY MOUTH EVERY DAY .  bisoprolol-hydrochlorothiazide (ZIAC) 2.5-6.25 MG tablet, TAKE 1 TABLET BY MOUTH EVERY DAY .  EPINEPHrine (EPIPEN JR) 0.15 MG/0.3ML injection, Inject 0.15 mg into the muscle as needed.   Current Outpatient Medications (Analgesics):  .  ibuprofen (ADVIL,MOTRIN) 200 MG tablet, Take 200 mg by mouth every 6 (six) hours as needed for headache, mild pain or cramping.  Current Outpatient Medications (Hematological):  .  vitamin B-12 (CYANOCOBALAMIN) 1000 MCG tablet, Take 1,000 mcg by mouth daily.  Current Outpatient Medications (Other):  Marland Kitchen  AMBULATORY NON FORMULARY MEDICATION, Needs a toilet seat extension and grab bars for her toilet, shower chair, bed side commode and foot stool with hand rails. .  Ascorbic Acid (VITAMIN C WITH ROSE HIPS) 500 MG tablet, Take 500 mg by mouth daily. Marland Kitchen  CALCIUM-MAGNESIUM-ZINC PO, Take by mouth. .  Cholecalciferol (VITAMIN D3) 10 MCG (400 UNIT) CAPS, Take 10 mcg by mouth. .  clobetasol ointment (TEMOVATE) 0.05 %, Apply twice daily to vulva externally .  clotrimazole (LOTRIMIN) 1 % cream, Apply topically in the morning and at bedtime. Marland Kitchen  co-enzyme Q-10 30 MG capsule, Take 200 mg by mouth daily. .  diclofenac sodium (VOLTAREN) 1 % GEL, Apply topically to affected area qid .  famotidine (PEPCID) 20 MG tablet, Take 20 mg by mouth 2 (two) times daily. .  hydrocortisone 2.5 % cream, Apply  topically in the morning and at bedtime. .  Multiple Vitamins-Minerals (WOMENS 50+ ADVANCED PO), Take 2 tablets by mouth daily. Marland Kitchen  omeprazole (PRILOSEC) 20 MG capsule, TAKE 1-2 CAPSULEs BY MOUTH EVERY DAY .  oxybutynin (DITROPAN-XL) 5 MG 24 hr tablet, Take by mouth. .  Saccharomyces boulardii (PROBIOTIC) 250 MG CAPS, Take by mouth.  Reviewed prior external information including notes and imaging from  primary care provider As well as notes that were available from care everywhere and other healthcare systems.  Past medical history, social, surgical and family history all reviewed in electronic medical record.  No pertanent information unless stated regarding to the chief complaint.   Review of Systems:  No headache, visual changes, nausea, vomiting, diarrhea, constipation, dizziness, abdominal pain, skin rash, fevers, chills, night sweats, weight loss, swollen lymph nodes,  joint swelling, chest pain, shortness of breath, mood changes. POSITIVE muscle aches, body aches  Objective  Blood pressure 140/84, pulse 81, height 5\' 4"  (1.626 m), weight 171 lb (77.6 kg), SpO2 96 %.   General: No apparent distress alert and oriented x3 mood and affect normal, dressed appropriately.  Patient is minorly anxious HEENT: Pupils equal, extraocular movements intact  Respiratory: Patient's speak in full sentences and does not appear short of breath  Cardiovascular: No lower extremity edema, non tender, no erythema  Gait antalgic Back exam shows some mild loss of lordosis.  Tightness of the shoulders bilaterally but full range of motion.  Patient has very mild crepitus of the neck with movement but negative Spurling's.  5 out of 5 strength of the upper extremities.  Patient's knees do have some bruising on the anterior aspect.  Mild hematoma noted of the left knee.  Patient is tender to palpation over the medial joint line.  No increase in any type of instability of the knee.  Full range of motion though noted.   Patient has a very large hematoma noted of the left hip.  Seems to be resolving.  No mass appreciated in the soft tissue or any calcific changes noted.  Minimal tenderness is stated in the lower back still.   Impression and Recommendations:     The above documentation has been reviewed and is accurate and complete Lyndal Pulley, DO

## 2020-12-25 NOTE — Telephone Encounter (Signed)
Yes thanks under generalized weakness

## 2020-12-25 NOTE — Telephone Encounter (Signed)
Verbal orders has been placed. Is it okay for me to write the script for the commode. Patient had a fall and now she's sleeping on the couch , but she is unable to make it to the bathroom in time at night.

## 2020-12-26 ENCOUNTER — Other Ambulatory Visit: Payer: Self-pay

## 2020-12-26 ENCOUNTER — Ambulatory Visit (INDEPENDENT_AMBULATORY_CARE_PROVIDER_SITE_OTHER): Payer: Medicare Other | Admitting: Family Medicine

## 2020-12-26 ENCOUNTER — Encounter: Payer: Self-pay | Admitting: Family Medicine

## 2020-12-26 VITALS — BP 140/84 | HR 81 | Ht 64.0 in | Wt 171.0 lb

## 2020-12-26 DIAGNOSIS — M2550 Pain in unspecified joint: Secondary | ICD-10-CM

## 2020-12-26 DIAGNOSIS — S7002XA Contusion of left hip, initial encounter: Secondary | ICD-10-CM

## 2020-12-26 DIAGNOSIS — M170 Bilateral primary osteoarthritis of knee: Secondary | ICD-10-CM

## 2020-12-26 MED ORDER — AMBULATORY NON FORMULARY MEDICATION: 0 refills | Status: DC

## 2020-12-26 NOTE — Assessment & Plan Note (Signed)
History of arthritic changes of the knee.  Hematomas noted bilaterally.  Patient did have viscosupplementation in December and may need to consider it again.  Patient is having difficulty at home still with ambulation.  Using a walker at this point.  Patient never did get the bedside commode.  Patient is working with home health physical therapy and we will see if we can add on home health occupational therapy.  We will send in also a prescription for different DME to help her if possible but I do feel evaluation by the occupational therapy will be helpful.  Patient likely will get back to her baseline relatively soon though.  Encourage patient to stay optimistic.  Patient will follow up with me again in 6 weeks after doing the home health and can call sooner if things change.

## 2020-12-26 NOTE — Assessment & Plan Note (Signed)
Patient minimally tender over the lateral aspect of the hip.  The patient overall though does not have any groin pain.  Is able to ambulate with the aid of a walker relatively well.  X-rays were done when she was seen in the emergency room and no significant bony abnormality and patient does states she is somewhat better since that fall when she was not able to walk at all.  Encourage patient to continue to stay active and work with a physical therapist.  Discussed Arnica lotion.  Follow-up with me again in 6 weeks

## 2020-12-26 NOTE — Patient Instructions (Addendum)
Good to see you Referral the occupational therapy ([facility]) Arnica lotion 2 times a day  650 tylenol 3 times a day [employer] [phone] See me again in 6 weeks

## 2020-12-27 ENCOUNTER — Telehealth: Payer: Self-pay

## 2020-12-27 DIAGNOSIS — E538 Deficiency of other specified B group vitamins: Secondary | ICD-10-CM | POA: Diagnosis not present

## 2020-12-27 DIAGNOSIS — R29898 Other symptoms and signs involving the musculoskeletal system: Secondary | ICD-10-CM | POA: Diagnosis not present

## 2020-12-27 DIAGNOSIS — S8001XS Contusion of right knee, sequela: Secondary | ICD-10-CM | POA: Diagnosis not present

## 2020-12-27 DIAGNOSIS — M170 Bilateral primary osteoarthritis of knee: Secondary | ICD-10-CM | POA: Diagnosis not present

## 2020-12-27 DIAGNOSIS — W108XXD Fall (on) (from) other stairs and steps, subsequent encounter: Secondary | ICD-10-CM | POA: Diagnosis not present

## 2020-12-27 DIAGNOSIS — S8002XS Contusion of left knee, sequela: Secondary | ICD-10-CM | POA: Diagnosis not present

## 2020-12-27 NOTE — Telephone Encounter (Signed)
Kelly Frazier, PT from [facility], called requesting verbal orders to add on evaluation for occupational therapy for ADLs. Please advise.

## 2020-12-27 NOTE — Telephone Encounter (Signed)
Called and spoke with [facility] and provided VO.

## 2020-12-27 NOTE — Telephone Encounter (Signed)
Called and spoke with Gwinda Passe and she states pt saw ortho yesterday and ortho is handling this commode order for the pt.

## 2020-12-28 ENCOUNTER — Other Ambulatory Visit: Payer: Self-pay | Admitting: Family Medicine

## 2020-12-31 ENCOUNTER — Telehealth: Payer: Self-pay

## 2020-12-31 DIAGNOSIS — S8002XS Contusion of left knee, sequela: Secondary | ICD-10-CM | POA: Diagnosis not present

## 2020-12-31 DIAGNOSIS — S8001XS Contusion of right knee, sequela: Secondary | ICD-10-CM | POA: Diagnosis not present

## 2020-12-31 DIAGNOSIS — E538 Deficiency of other specified B group vitamins: Secondary | ICD-10-CM | POA: Diagnosis not present

## 2020-12-31 DIAGNOSIS — W108XXD Fall (on) (from) other stairs and steps, subsequent encounter: Secondary | ICD-10-CM | POA: Diagnosis not present

## 2020-12-31 DIAGNOSIS — M170 Bilateral primary osteoarthritis of knee: Secondary | ICD-10-CM | POA: Diagnosis not present

## 2020-12-31 DIAGNOSIS — R29898 Other symptoms and signs involving the musculoskeletal system: Secondary | ICD-10-CM | POA: Diagnosis not present

## 2020-12-31 NOTE — Telephone Encounter (Signed)
[facility] - Occupational Therapist requesting verbal orders  For 2xa week for 2 weeks  [phone]. OK to leave voicemail

## 2021-01-01 NOTE — Telephone Encounter (Signed)
Called and spoke with Will and VO given.

## 2021-01-02 DIAGNOSIS — S8002XS Contusion of left knee, sequela: Secondary | ICD-10-CM | POA: Diagnosis not present

## 2021-01-02 DIAGNOSIS — S8001XS Contusion of right knee, sequela: Secondary | ICD-10-CM | POA: Diagnosis not present

## 2021-01-02 DIAGNOSIS — M170 Bilateral primary osteoarthritis of knee: Secondary | ICD-10-CM | POA: Diagnosis not present

## 2021-01-02 DIAGNOSIS — R29898 Other symptoms and signs involving the musculoskeletal system: Secondary | ICD-10-CM | POA: Diagnosis not present

## 2021-01-02 DIAGNOSIS — W108XXD Fall (on) (from) other stairs and steps, subsequent encounter: Secondary | ICD-10-CM | POA: Diagnosis not present

## 2021-01-02 DIAGNOSIS — E538 Deficiency of other specified B group vitamins: Secondary | ICD-10-CM | POA: Diagnosis not present

## 2021-01-03 NOTE — Progress Notes (Signed)
Phone [phone] In person visit   Subjective:   Kelly Frazier is a 75 y.o. year old very pleasant female patient who presents for/with See problem oriented charting Chief Complaint  Patient presents with  . Fall    Patient fell 2 weeks ago Wednesday. She wants to discuss her PT coverage , injuries and healing process.    This visit occurred during the SARS-CoV-2 public health emergency.  Safety protocols were in place, including screening questions prior to the visit, additional usage of staff PPE, and extensive cleaning of exam room while observing appropriate contact time as indicated for disinfecting solutions.   Past Medical History-  Patient Active Problem List   Diagnosis Date Noted  . Vitamin B 12 deficiency 09/22/2018    Priority: Medium  . Solitary pulmonary nodule 01/10/2017    Priority: Medium  . Migraine 08/17/2014    Priority: Medium  . Hyperlipidemia 05/03/2007    Priority: Medium  . Essential hypertension 05/03/2007    Priority: Medium  . Hypothyroidism 04/12/2007    Priority: Medium  . Degenerative arthritis of knee, bilateral 02/18/2019    Priority: Low  . Arthralgia of both knees 11/13/2017    Priority: Low  . Trigger finger, left 08/03/2017    Priority: Low  . Esophageal reflux 09/03/2010    Priority: Low  . Angioedema 06/21/2010    Priority: Low  . History of colonic polyps 09/27/2007    Priority: Low  . Osteoarthritis 04/12/2007    Priority: Low  . Hematoma of left hip 12/26/2020  . Snoring 06/05/2016  . Excessive daytime sleepiness 06/05/2016  . Shortness of breath 03/11/2016    Medications- reviewed and updated Current Outpatient Medications  Medication Sig Dispense Refill  . AMBULATORY NON FORMULARY MEDICATION Needs a toilet seat extension and grab bars for her toilet, shower chair, bed side commode and foot stool with hand rails. 1 each 0  . Ascorbic Acid (VITAMIN C WITH ROSE HIPS) 500 MG tablet Take 500 mg by mouth daily.    Marland Kitchen  atorvastatin (LIPITOR) 10 MG tablet TAKE 1 TABLET BY MOUTH EVERY DAY 90 tablet 2  . bisoprolol-hydrochlorothiazide (ZIAC) 2.5-6.25 MG tablet TAKE 1 TABLET BY MOUTH EVERY DAY 90 tablet 1  . CALCIUM-MAGNESIUM-ZINC PO Take by mouth.    . Cholecalciferol (VITAMIN D3) 10 MCG (400 UNIT) CAPS Take 10 mcg by mouth.    . clobetasol ointment (TEMOVATE) 0.05 % Apply twice daily to vulva externally    . clotrimazole (LOTRIMIN) 1 % cream Apply topically in the morning and at bedtime.    Marland Kitchen co-enzyme Q-10 30 MG capsule Take 200 mg by mouth daily.    . diclofenac sodium (VOLTAREN) 1 % GEL Apply topically to affected area qid 100 g 1  . EPINEPHrine (EPIPEN JR) 0.15 MG/0.3ML injection Inject 0.15 mg into the muscle as needed.    . famotidine (PEPCID) 20 MG tablet Take 20 mg by mouth 2 (two) times daily.    . Homeopathic Products (ARNICARE BRUISE) GEL Apply topically.    . hydrocortisone 2.5 % cream Apply topically in the morning and at bedtime.    Marland Kitchen ibuprofen (ADVIL,MOTRIN) 200 MG tablet Take 200 mg by mouth every 6 (six) hours as needed for headache, mild pain or cramping.    Marland Kitchen levothyroxine (SYNTHROID) 75 MCG tablet TAKE 1 TABLET BY MOUTH EVERY DAY 90 tablet 2  . Multiple Vitamins-Minerals (WOMENS 50+ ADVANCED PO) Take 2 tablets by mouth daily.    Marland Kitchen omeprazole (PRILOSEC) 20 MG capsule  TAKE 1-2 CAPSULEs BY MOUTH EVERY DAY 180 capsule 3  . oxybutynin (DITROPAN-XL) 5 MG 24 hr tablet Take by mouth.    . Saccharomyces boulardii (PROBIOTIC) 250 MG CAPS Take by mouth.    . vitamin B-12 (CYANOCOBALAMIN) 1000 MCG tablet Take 1,000 mcg by mouth daily.     No current facility-administered medications for this visit.     Objective:  BP 130/80   Pulse 69   Temp 98.1 F (36.7 C) (Temporal)   Ht 5\' 4"  (1.626 m)   Wt 173 lb 12.8 oz (78.8 kg)   SpO2 96%   BMI 29.83 kg/m  Gen: NAD, resting comfortably CV: RRR no murmurs rubs or gallops Lungs: CTAB no crackles, wheeze, rhonchi Abdomen:  soft/nontender/nondistended/normal bowel sounds. No rebound or guarding.  Ext: trace edema Skin: warm, dry Pain with shoulder ROM particularly on left but still good ROM, pain over right scapula (x-rays ok), bruising over back of left knee- some tenderness    Assessment and Plan   #Fall with multiple injuries S: Patient was seen in the emergency room on 12/19/2020 after a mechanical fall down 8 or 9 concrete stairs at a fundraiser (left foot went out from under her after hitting possibly water- tumbled down sideways down the stairs).  She did not lose consciousness and was not on anticoagulation.  Patient was experiencing pain behind her left thigh, both knees, right scapula, left great toe.  Neurological exam was reassuring at time of visit.  Didn't feel weak, knees didn't give way  Also had the following imaging 1.  Rib films unremarkable 2.  Scapula films unremarkable/no fracture 3.  Bilateral knee films without fracture 4.  Left great toe x-ray without fracture 5.  CT of the head with no acute intracranial abnormalities  Patient was given hydrocodone for pain in her knees.  Patient was set up with a walker- has that today  Patient had a virtual visit the next day with Dr. Charlann Boxer of sports medicine-continue to have trouble with standing and significant knee pain.  Plan was to get her set up with a bedside commode and elevated toilet seat- she ended    She also had virtual visit on April 8 with our office-recommendation was for commode extension, home health aide, PT and OT.  I have done follow-up verbal orders for these  Patient had an in person visit with Dr. Tamala Julian on the 13th of this month- encouraged her to continue to work with physical therapist and follow-up in 6 weeks  Bruise on right shoulder and having a lot of pain- radiates from scapula down into her lower back and then under the breast. She asks about doing an MRI to detect hairline fracture. Left arm beginning to get  stronger. Has talked to OT about this and they think its reasonable to monitor.   She is doing PT exercises for lower body strengthening but wants to do for upper body as well with shoulder issues- I agree with this  She has bilateral knee pain and bruising. Also has bruises on lower back. Not sleeping well- trying to sleep in recliner because couldn't get out of bed.   She was unable to get up to go to bathroom for first 2-3 nights- had to use adult diapers as a result. Ended up having to order multiple items- that she had a hard time getting through insurance like elevated commode.  A/P: patient with multiple injuries related to mechanical fall. She is working with PT and  OT on healing. She does not feel she needs additional care at home at present but we will tweak regimen -add upper body PT for bilateral shoulder pain (radiation to left upper arm, right shoulder radiates to upper back and under right breast) - wants to have cane available when she is ready to transition from walker (I dont think shes quite ready for that except for short walks) and shower seat -also get shower seat for her- wrote for this and cane for transition at local medical supply store  #hyperlipidemia-LDL goal under 70 S: Medication: Atorvastatin 10 mg Lab Results  Component Value Date   CHOL 127 10/21/2019   HDL 43.50 10/21/2019   LDLCALC 66 10/21/2019   LDLDIRECT 73.0 03/01/2018   TRIG 91.0 10/21/2019   CHOLHDL 3 10/21/2019  A/P: hopefully controlled- update lipid panel  #hypothyroidism S: compliant On thyroid medication- Synthroid 75Mcg   Lab Results  Component Value Date   TSH 0.96 06/11/2020  A/P:hopefully stable- update tsh   #hypertension S: medication: Ziac 2.5-6.25Mg  BP Readings from Last 3 Encounters:  01/04/21 130/80  12/26/20 140/84  12/19/20 (!) 160/83  A/P: blood pressure well controlled- continue current meds  # B12 deficiency S: Current treatment/medication (oral vs. IM): Vitamin b12  1000Mcg Lab Results  Component Value Date   VITAMINB12 760 06/11/2020  A/P: controlled last check- continue current meds   #Overactive bladder-on oxybutynin  through urology. Does not sound like the oxybutynin was the cause- more so the water/slick spot and incidental  Recommended follow up: keep august visit Future Appointments  Date Time Provider [place]  02/06/2021  3:00 PM Lyndal Pulley, DO [facility] None  04/15/2021  4:20 PM Yong Channel Brayton Mars, MD [facility]   Lab/Order associations:   ICD-10-CM   1. Hyperlipidemia, unspecified hyperlipidemia type  E78.5 CBC with Differential/Platelet    Comprehensive metabolic panel    Lipid panel  2. Hypothyroidism, unspecified type  E03.9 TSH  3. Essential hypertension  I10 CBC with Differential/Platelet    Comprehensive metabolic panel  4. Generalized weakness  R53.1   5. Acute pain of both knees  M25.561    M25.562    Time Spent: 45 minutes of total time (3:45 PM- 4:30 PM) was spent on the date of the encounter performing the following actions: chart review prior to seeing the patient, obtaining history, performing a medically necessary exam, counseling on the treatment plan, placing orders and discussing home health needs, and documenting in our EHR.     Return precautions advised.  Garret Reddish, MD

## 2021-01-03 NOTE — Patient Instructions (Addendum)
Please stop by lab before you go If you have mychart- we will send your results within 3 business days of [place] receiving them.  If you do not have mychart- we will call you about results within 5 business days of [place] receiving them.  *please also note that you will see labs on mychart as soon as they post. I will later go in and write notes on them- will say "notes from Dr. Yong Channel"  Glad you are doing better  Try to get the cane for the shorter walks at home and the shower stool for safety

## 2021-01-04 ENCOUNTER — Ambulatory Visit (INDEPENDENT_AMBULATORY_CARE_PROVIDER_SITE_OTHER): Payer: Medicare Other | Admitting: Family Medicine

## 2021-01-04 ENCOUNTER — Encounter: Payer: Self-pay | Admitting: Family Medicine

## 2021-01-04 ENCOUNTER — Other Ambulatory Visit: Payer: Self-pay

## 2021-01-04 VITALS — BP 130/80 | HR 69 | Temp 98.1°F | Ht 64.0 in | Wt 173.8 lb

## 2021-01-04 DIAGNOSIS — M25562 Pain in left knee: Secondary | ICD-10-CM | POA: Diagnosis not present

## 2021-01-04 DIAGNOSIS — R531 Weakness: Secondary | ICD-10-CM | POA: Diagnosis not present

## 2021-01-04 DIAGNOSIS — E039 Hypothyroidism, unspecified: Secondary | ICD-10-CM

## 2021-01-04 DIAGNOSIS — E785 Hyperlipidemia, unspecified: Secondary | ICD-10-CM

## 2021-01-04 DIAGNOSIS — I10 Essential (primary) hypertension: Secondary | ICD-10-CM

## 2021-01-04 DIAGNOSIS — M25561 Pain in right knee: Secondary | ICD-10-CM

## 2021-01-05 LAB — COMPREHENSIVE METABOLIC PANEL
AG Ratio: 1.5 (calc) (ref 1.0–2.5)
ALT: 20 U/L (ref 6–29)
AST: 21 U/L (ref 10–35)
Albumin: 4.1 g/dL (ref 3.6–5.1)
Alkaline phosphatase (APISO): 155 U/L — ABNORMAL HIGH (ref 37–153)
BUN/Creatinine Ratio: 16 (calc) (ref 6–22)
BUN: 9 mg/dL (ref 7–25)
CO2: 28 mmol/L (ref 20–32)
Calcium: 9.5 mg/dL (ref 8.6–10.4)
Chloride: 105 mmol/L (ref 98–110)
Creat: 0.55 mg/dL — ABNORMAL LOW (ref 0.60–0.93)
Globulin: 2.8 g/dL (calc) (ref 1.9–3.7)
Glucose, Bld: 84 mg/dL (ref 65–99)
Potassium: 4.1 mmol/L (ref 3.5–5.3)
Sodium: 142 mmol/L (ref 135–146)
Total Bilirubin: 0.5 mg/dL (ref 0.2–1.2)
Total Protein: 6.9 g/dL (ref 6.1–8.1)

## 2021-01-05 LAB — CBC WITH DIFFERENTIAL/PLATELET
Absolute Monocytes: 541 cells/uL (ref 200–950)
Basophils Absolute: 74 cells/uL (ref 0–200)
Basophils Relative: 0.7 %
Eosinophils Absolute: 148 cells/uL (ref 15–500)
Eosinophils Relative: 1.4 %
HCT: 41.3 % (ref 35.0–45.0)
Hemoglobin: 13.8 g/dL (ref 11.7–15.5)
Lymphs Abs: 2396 cells/uL (ref 850–3900)
MCH: 30.2 pg (ref 27.0–33.0)
MCHC: 33.4 g/dL (ref 32.0–36.0)
MCV: 90.4 fL (ref 80.0–100.0)
MPV: 9.5 fL (ref 7.5–12.5)
Monocytes Relative: 5.1 %
Neutro Abs: 7441 cells/uL (ref 1500–7800)
Neutrophils Relative %: 70.2 %
Platelets: 361 10*3/uL (ref 140–400)
RBC: 4.57 10*6/uL (ref 3.80–5.10)
RDW: 13.8 % (ref 11.0–15.0)
Total Lymphocyte: 22.6 %
WBC: 10.6 10*3/uL (ref 3.8–10.8)

## 2021-01-05 LAB — LIPID PANEL
Cholesterol: 134 mg/dL (ref <200)
HDL: 43 mg/dL — ABNORMAL LOW (ref >=50)
LDL Cholesterol (Calc): 74 mg/dL (calc)
Non-HDL Cholesterol (Calc): 91 mg/dL (calc) (ref <130)
Total CHOL/HDL Ratio: 3.1 (calc) (ref <5.0)
Triglycerides: 91 mg/dL (ref <150)

## 2021-01-05 LAB — TSH: TSH: 1.14 mIU/L (ref 0.40–4.50)

## 2021-01-07 DIAGNOSIS — M170 Bilateral primary osteoarthritis of knee: Secondary | ICD-10-CM | POA: Diagnosis not present

## 2021-01-07 DIAGNOSIS — E538 Deficiency of other specified B group vitamins: Secondary | ICD-10-CM | POA: Diagnosis not present

## 2021-01-07 DIAGNOSIS — W108XXD Fall (on) (from) other stairs and steps, subsequent encounter: Secondary | ICD-10-CM | POA: Diagnosis not present

## 2021-01-07 DIAGNOSIS — R29898 Other symptoms and signs involving the musculoskeletal system: Secondary | ICD-10-CM | POA: Diagnosis not present

## 2021-01-07 DIAGNOSIS — S8001XS Contusion of right knee, sequela: Secondary | ICD-10-CM | POA: Diagnosis not present

## 2021-01-07 DIAGNOSIS — S8002XS Contusion of left knee, sequela: Secondary | ICD-10-CM | POA: Diagnosis not present

## 2021-01-09 DIAGNOSIS — Z23 Encounter for immunization: Secondary | ICD-10-CM | POA: Diagnosis not present

## 2021-01-10 DIAGNOSIS — E538 Deficiency of other specified B group vitamins: Secondary | ICD-10-CM | POA: Diagnosis not present

## 2021-01-10 DIAGNOSIS — S8002XS Contusion of left knee, sequela: Secondary | ICD-10-CM | POA: Diagnosis not present

## 2021-01-10 DIAGNOSIS — M170 Bilateral primary osteoarthritis of knee: Secondary | ICD-10-CM | POA: Diagnosis not present

## 2021-01-10 DIAGNOSIS — W108XXD Fall (on) (from) other stairs and steps, subsequent encounter: Secondary | ICD-10-CM | POA: Diagnosis not present

## 2021-01-10 DIAGNOSIS — R29898 Other symptoms and signs involving the musculoskeletal system: Secondary | ICD-10-CM | POA: Diagnosis not present

## 2021-01-10 DIAGNOSIS — S8001XS Contusion of right knee, sequela: Secondary | ICD-10-CM | POA: Diagnosis not present

## 2021-01-11 DIAGNOSIS — S8002XS Contusion of left knee, sequela: Secondary | ICD-10-CM | POA: Diagnosis not present

## 2021-01-11 DIAGNOSIS — E538 Deficiency of other specified B group vitamins: Secondary | ICD-10-CM | POA: Diagnosis not present

## 2021-01-11 DIAGNOSIS — R29898 Other symptoms and signs involving the musculoskeletal system: Secondary | ICD-10-CM | POA: Diagnosis not present

## 2021-01-11 DIAGNOSIS — S8001XS Contusion of right knee, sequela: Secondary | ICD-10-CM | POA: Diagnosis not present

## 2021-01-11 DIAGNOSIS — W108XXD Fall (on) (from) other stairs and steps, subsequent encounter: Secondary | ICD-10-CM | POA: Diagnosis not present

## 2021-01-11 DIAGNOSIS — M170 Bilateral primary osteoarthritis of knee: Secondary | ICD-10-CM | POA: Diagnosis not present

## 2021-01-14 DIAGNOSIS — S8001XS Contusion of right knee, sequela: Secondary | ICD-10-CM | POA: Diagnosis not present

## 2021-01-14 DIAGNOSIS — M170 Bilateral primary osteoarthritis of knee: Secondary | ICD-10-CM | POA: Diagnosis not present

## 2021-01-14 DIAGNOSIS — R29898 Other symptoms and signs involving the musculoskeletal system: Secondary | ICD-10-CM | POA: Diagnosis not present

## 2021-01-14 DIAGNOSIS — S8002XS Contusion of left knee, sequela: Secondary | ICD-10-CM | POA: Diagnosis not present

## 2021-01-14 DIAGNOSIS — W108XXD Fall (on) (from) other stairs and steps, subsequent encounter: Secondary | ICD-10-CM | POA: Diagnosis not present

## 2021-01-14 DIAGNOSIS — E538 Deficiency of other specified B group vitamins: Secondary | ICD-10-CM | POA: Diagnosis not present

## 2021-01-16 DIAGNOSIS — S8001XS Contusion of right knee, sequela: Secondary | ICD-10-CM | POA: Diagnosis not present

## 2021-01-16 DIAGNOSIS — E538 Deficiency of other specified B group vitamins: Secondary | ICD-10-CM | POA: Diagnosis not present

## 2021-01-16 DIAGNOSIS — M170 Bilateral primary osteoarthritis of knee: Secondary | ICD-10-CM | POA: Diagnosis not present

## 2021-01-16 DIAGNOSIS — W108XXD Fall (on) (from) other stairs and steps, subsequent encounter: Secondary | ICD-10-CM | POA: Diagnosis not present

## 2021-01-16 DIAGNOSIS — R29898 Other symptoms and signs involving the musculoskeletal system: Secondary | ICD-10-CM | POA: Diagnosis not present

## 2021-01-16 DIAGNOSIS — S8002XS Contusion of left knee, sequela: Secondary | ICD-10-CM | POA: Diagnosis not present

## 2021-01-21 DIAGNOSIS — W108XXD Fall (on) (from) other stairs and steps, subsequent encounter: Secondary | ICD-10-CM | POA: Diagnosis not present

## 2021-01-21 DIAGNOSIS — E538 Deficiency of other specified B group vitamins: Secondary | ICD-10-CM | POA: Diagnosis not present

## 2021-01-21 DIAGNOSIS — S8001XS Contusion of right knee, sequela: Secondary | ICD-10-CM | POA: Diagnosis not present

## 2021-01-21 DIAGNOSIS — M170 Bilateral primary osteoarthritis of knee: Secondary | ICD-10-CM | POA: Diagnosis not present

## 2021-01-21 DIAGNOSIS — S8002XS Contusion of left knee, sequela: Secondary | ICD-10-CM | POA: Diagnosis not present

## 2021-01-21 DIAGNOSIS — R29898 Other symptoms and signs involving the musculoskeletal system: Secondary | ICD-10-CM | POA: Diagnosis not present

## 2021-02-06 ENCOUNTER — Ambulatory Visit (INDEPENDENT_AMBULATORY_CARE_PROVIDER_SITE_OTHER): Payer: Medicare Other | Admitting: Family Medicine

## 2021-02-06 ENCOUNTER — Ambulatory Visit: Payer: Self-pay

## 2021-02-06 ENCOUNTER — Other Ambulatory Visit: Payer: Self-pay

## 2021-02-06 ENCOUNTER — Encounter: Payer: Self-pay | Admitting: Family Medicine

## 2021-02-06 VITALS — BP 100/82 | HR 68 | Ht 64.0 in | Wt 172.0 lb

## 2021-02-06 DIAGNOSIS — M25512 Pain in left shoulder: Secondary | ICD-10-CM

## 2021-02-06 DIAGNOSIS — M542 Cervicalgia: Secondary | ICD-10-CM | POA: Diagnosis not present

## 2021-02-06 DIAGNOSIS — M19012 Primary osteoarthritis, left shoulder: Secondary | ICD-10-CM | POA: Diagnosis not present

## 2021-02-06 DIAGNOSIS — M170 Bilateral primary osteoarthritis of knee: Secondary | ICD-10-CM

## 2021-02-06 DIAGNOSIS — G8929 Other chronic pain: Secondary | ICD-10-CM

## 2021-02-06 DIAGNOSIS — M19019 Primary osteoarthritis, unspecified shoulder: Secondary | ICD-10-CM | POA: Insufficient documentation

## 2021-02-06 NOTE — Assessment & Plan Note (Signed)
Patient given injection today and tolerated the procedure well.  Discussed icing regimen and home exercises.  X-rays are pending but did not feel they will be moderate to severe arthritic changes and will need to monitor closely.  Worsening pain may need advanced imaging but hopefully patient will respond well.

## 2021-02-06 NOTE — Patient Instructions (Addendum)
Good to see you xrays today injections today Rotator cuff exercises We will get you approved for the gel See me again in 5-6 weeks

## 2021-02-06 NOTE — Assessment & Plan Note (Signed)
Known arthritic changes.  We will get the approval for viscosupplementation again which patient has responded to previously.  Patient does have known mild arthritic changes overall.  Discussed potential for advanced imaging but patient states that at this moment has done well with the gel injections would like to see how she responds again.

## 2021-02-06 NOTE — Assessment & Plan Note (Signed)
Patient has chronic shoulder pain that seems to be acutely worse since patient's injury recently in fall.  Patient does have some degenerative changes of the rotator cuff but given injection today to see how patient responds.  I do believe that this could be contributing to some of the neck pain.  X-rays are pending otherwise at this point.  Given home exercises.  Worsening pain will need to consider advanced imaging follow-up again in 6 weeks
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
## 2021-02-07 ENCOUNTER — Ambulatory Visit (INDEPENDENT_AMBULATORY_CARE_PROVIDER_SITE_OTHER): Payer: Medicare Other

## 2021-02-07 DIAGNOSIS — M19012 Primary osteoarthritis, left shoulder: Secondary | ICD-10-CM | POA: Diagnosis not present

## 2021-02-07 DIAGNOSIS — M542 Cervicalgia: Secondary | ICD-10-CM

## 2021-03-13 ENCOUNTER — Ambulatory Visit: Payer: Medicare Other | Admitting: Family Medicine

## 2021-03-19 ENCOUNTER — Encounter: Payer: Self-pay | Admitting: Family Medicine

## 2021-03-19 ENCOUNTER — Other Ambulatory Visit: Payer: Self-pay

## 2021-03-19 ENCOUNTER — Ambulatory Visit (INDEPENDENT_AMBULATORY_CARE_PROVIDER_SITE_OTHER): Payer: Medicare Other | Admitting: Family Medicine

## 2021-03-19 DIAGNOSIS — M170 Bilateral primary osteoarthritis of knee: Secondary | ICD-10-CM | POA: Diagnosis not present

## 2021-03-19 NOTE — Assessment & Plan Note (Signed)
Patient has responded very well to these injections previously.  Discussed icing regimen and home exercises.  Discussed topical anti-inflammatories.  Increase activity as tolerated.  Chronic problem with exacerbation.  Follow-up again in 8-12 weeks
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
## 2021-04-15 ENCOUNTER — Encounter: Payer: Self-pay | Admitting: Family Medicine

## 2021-04-15 ENCOUNTER — Other Ambulatory Visit: Payer: Self-pay

## 2021-04-15 ENCOUNTER — Ambulatory Visit (INDEPENDENT_AMBULATORY_CARE_PROVIDER_SITE_OTHER): Payer: Medicare Other

## 2021-04-15 ENCOUNTER — Ambulatory Visit (INDEPENDENT_AMBULATORY_CARE_PROVIDER_SITE_OTHER): Payer: Medicare Other | Admitting: Family Medicine

## 2021-04-15 VITALS — BP 138/72 | HR 74 | Temp 98.2°F | Wt 173.4 lb

## 2021-04-15 VITALS — BP 128/84 | HR 71 | Temp 98.2°F | Ht 64.0 in | Wt 173.8 lb

## 2021-04-15 DIAGNOSIS — E538 Deficiency of other specified B group vitamins: Secondary | ICD-10-CM | POA: Diagnosis not present

## 2021-04-15 DIAGNOSIS — G44309 Post-traumatic headache, unspecified, not intractable: Secondary | ICD-10-CM

## 2021-04-15 DIAGNOSIS — R911 Solitary pulmonary nodule: Secondary | ICD-10-CM

## 2021-04-15 DIAGNOSIS — E039 Hypothyroidism, unspecified: Secondary | ICD-10-CM | POA: Diagnosis not present

## 2021-04-15 DIAGNOSIS — Z87828 Personal history of other (healed) physical injury and trauma: Secondary | ICD-10-CM

## 2021-04-15 DIAGNOSIS — I10 Essential (primary) hypertension: Secondary | ICD-10-CM | POA: Diagnosis not present

## 2021-04-15 DIAGNOSIS — Z0000 Encounter for general adult medical examination without abnormal findings: Secondary | ICD-10-CM | POA: Diagnosis not present

## 2021-04-15 DIAGNOSIS — E785 Hyperlipidemia, unspecified: Secondary | ICD-10-CM
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
## 2021-04-15 NOTE — Patient Instructions (Signed)
Kelly Frazier , Thank you for taking time to come for your Medicare Wellness Visit. I appreciate your ongoing commitment to your health goals. Please review the following plan we discussed and let me know if I can assist you in the future.   Screening recommendations/referrals: Colonoscopy: Done 06/29/17 repeat in 5 years 06/29/22  Mammogram: Done 07/09/20 repeat every year Bone Density: Done 06/10/11 repeat as directed by [place] Recommended yearly ophthalmology/optometry visit for glaucoma screening and checkup Recommended yearly dental visit for hygiene and checkup  Vaccinations: Influenza vaccine: Due after 04/15/21 Pneumococcal vaccine: Completed  Tdap vaccine: Done 11/24/14 repeat every 10 years  Shingles vaccine: Completed 06/14/18 & 10/11/18   Covid-19:Completed  2/2, 3/4, 07/20/20 & 01/09/21  Advanced directives: Advance directive discussed with you today. I have provided a copy for you to complete at home and have notarized. Once this is complete please bring a copy in to our office so we can scan it into your chart.  Conditions/risks identified: Loss weight   Next appointment: Follow up in one year for your annual wellness visit    Preventive Care 65 Years and Older, Female Preventive care refers to lifestyle choices and visits with your health care provider that can promote health and wellness. What does preventive care include? A yearly physical exam. This is also called an annual well check. Dental exams once or twice a year. Routine eye exams. Ask your health care provider how often you should have your eyes checked. Personal lifestyle choices, including: Daily care of your teeth and gums. Regular physical activity. Eating a healthy diet. Avoiding tobacco and drug use. Limiting alcohol use. Practicing safe sex. Taking low-dose aspirin every day. Taking vitamin and mineral supplements as recommended by your health care provider. What happens during an annual well check? The  services and screenings done by your health care provider during your annual well check will depend on your age, overall health, lifestyle risk factors, and family history of disease. Counseling  Your health care provider may ask you questions about your: Alcohol use. Tobacco use. Drug use. Emotional well-being. Home and relationship well-being. Sexual activity. Eating habits. History of falls. Memory and ability to understand (cognition). Work and work Statistician. Reproductive health. Screening  You may have the following tests or measurements: Height, weight, and BMI. Blood pressure. Lipid and cholesterol levels. These may be checked every 5 years, or more frequently if you are over 63 years old. Skin check. Lung cancer screening. You may have this screening every year starting at age 47 if you have a 30-pack-year history of smoking and currently smoke or have quit within the past 15 years. Fecal occult blood test (FOBT) of the stool. You may have this test every year starting at age 42. Flexible sigmoidoscopy or colonoscopy. You may have a sigmoidoscopy every 5 years or a colonoscopy every 10 years starting at age 31. Hepatitis C blood test. Hepatitis B blood test. Sexually transmitted disease (STD) testing. Diabetes screening. This is done by checking your blood sugar (glucose) after you have not eaten for a while (fasting). You may have this done every 1-3 years. Bone density scan. This is done to screen for osteoporosis. You may have this done starting at age 75. Mammogram. This may be done every 1-2 years. Talk to your health care provider about how often you should have regular mammograms. Talk with your health care provider about your test results, treatment options, and if necessary, the need for more tests. Vaccines  Your health  care provider may recommend certain vaccines, such as: Influenza vaccine. This is recommended every year. Tetanus, diphtheria, and acellular  pertussis (Tdap, Td) vaccine. You may need a Td booster every 10 years. Zoster vaccine. You may need this after age 34. Pneumococcal 13-valent conjugate (PCV13) vaccine. One dose is recommended after age 24. Pneumococcal polysaccharide (PPSV23) vaccine. One dose is recommended after age 22. Talk to your health care provider about which screenings and vaccines you need and how often you need them. This information is not intended to replace advice given to you by your health care provider. Make sure you discuss any questions you have with your health care provider. Document Released: 09/28/2015 Document Revised: 05/21/2016 Document Reviewed: 07/03/2015 Elsevier Interactive Patient Education  2017 [employer] Prevention in the Home Falls can cause injuries. They can happen to people of all ages. There are many things you can do to make your home safe and to help prevent falls. What can I do on the outside of my home? Regularly fix the edges of walkways and driveways and fix any cracks. Remove anything that might make you trip as you walk through a door, such as a raised step or threshold. Trim any bushes or trees on the path to your home. Use bright outdoor lighting. Clear any walking paths of anything that might make someone trip, such as rocks or tools. Regularly check to see if handrails are loose or broken. Make sure that both sides of any steps have handrails. Any raised decks and porches should have guardrails on the edges. Have any leaves, snow, or ice cleared regularly. Use sand or salt on walking paths during winter. Clean up any spills in your garage right away. This includes oil or grease spills. What can I do in the bathroom? Use night lights. Install grab bars by the toilet and in the tub and shower. Do not use towel bars as grab bars. Use non-skid mats or decals in the tub or shower. If you need to sit down in the shower, use a plastic, non-slip stool. Keep the floor  dry. Clean up any water that spills on the floor as soon as it happens. Remove soap buildup in the tub or shower regularly. Attach bath mats securely with double-sided non-slip rug tape. Do not have throw rugs and other things on the floor that can make you trip. What can I do in the bedroom? Use night lights. Make sure that you have a light by your bed that is easy to reach. Do not use any sheets or blankets that are too big for your bed. They should not hang down onto the floor. Have a firm chair that has side arms. You can use this for support while you get dressed. Do not have throw rugs and other things on the floor that can make you trip. What can I do in the kitchen? Clean up any spills right away. Avoid walking on wet floors. Keep items that you use a lot in easy-to-reach places. If you need to reach something above you, use a strong step stool that has a grab bar. Keep electrical cords out of the way. Do not use floor polish or wax that makes floors slippery. If you must use wax, use non-skid floor wax. Do not have throw rugs and other things on the floor that can make you trip. What can I do with my stairs? Do not leave any items on the stairs. Make sure that there are handrails on  both sides of the stairs and use them. Fix handrails that are broken or loose. Make sure that handrails are as long as the stairways. Check any carpeting to make sure that it is firmly attached to the stairs. Fix any carpet that is loose or worn. Avoid having throw rugs at the top or bottom of the stairs. If you do have throw rugs, attach them to the floor with carpet tape. Make sure that you have a light switch at the top of the stairs and the bottom of the stairs. If you do not have them, ask someone to add them for you. What else can I do to help prevent falls? Wear shoes that: Do not have high heels. Have rubber bottoms. Are comfortable and fit you well. Are closed at the toe. Do not wear  sandals. If you use a stepladder: Make sure that it is fully opened. Do not climb a closed stepladder. Make sure that both sides of the stepladder are locked into place. Ask someone to hold it for you, if possible. Clearly mark and make sure that you can see: Any grab bars or handrails. First and last steps. Where the edge of each step is. Use tools that help you move around (mobility aids) if they are needed. These include: Canes. Walkers. Scooters. Crutches. Turn on the lights when you go into a dark area. Replace any light bulbs as soon as they burn out. Set up your furniture so you have a clear path. Avoid moving your furniture around. If any of your floors are uneven, fix them. If there are any pets around you, be aware of where they are. Review your medicines with your doctor. Some medicines can make you feel dizzy. This can increase your chance of falling. Ask your doctor what other things that you can do to help prevent falls. This information is not intended to replace advice given to you by your health care provider. Make sure you discuss any questions you have with your health care provider. Document Released: 06/28/2009 Document Revised: 02/07/2016 Document Reviewed: 10/06/2014 Elsevier Interactive Patient Education  2017 [employer].

## 2021-04-15 NOTE — Progress Notes (Signed)
Phone [phone] In person visit   Subjective:   Kelly Frazier is a 75 y.o. year old very pleasant female patient who presents for/with See problem oriented charting Chief Complaint  Patient presents with   Hyperlipidemia   Hypothyroidism   Hypertension    This visit occurred during the SARS-CoV-2 public health emergency.  Safety protocols were in place, including screening questions prior to the visit, additional usage of staff PPE, and extensive cleaning of exam room while observing appropriate contact time as indicated for disinfecting solutions.   Past Medical History-  Patient Active Problem List   Diagnosis Date Noted   Vitamin B 12 deficiency 09/22/2018    Priority: Medium   Solitary pulmonary nodule 01/10/2017    Priority: Medium   Migraine 08/17/2014    Priority: Medium   Hyperlipidemia 05/03/2007    Priority: Medium   Essential hypertension 05/03/2007    Priority: Medium   Hypothyroidism 04/12/2007    Priority: Medium   Degenerative arthritis of knee, bilateral 02/18/2019    Priority: Low   Arthralgia of both knees 11/13/2017    Priority: Low   Trigger finger, left 08/03/2017    Priority: Low   Esophageal reflux 09/03/2010    Priority: Low   Angioedema 06/21/2010    Priority: Low   History of colonic polyps 09/27/2007    Priority: Low   Osteoarthritis 04/12/2007    Priority: Low   Left shoulder pain 02/06/2021   AC (acromioclavicular) arthritis 02/06/2021   Hematoma of left hip 12/26/2020   Snoring 06/05/2016   Excessive daytime sleepiness 06/05/2016   Shortness of breath 03/11/2016    Medications- reviewed and updated Current Outpatient Medications  Medication Sig Dispense Refill   Ascorbic Acid (VITAMIN C WITH ROSE HIPS) 500 MG tablet Take 500 mg by mouth daily.     atorvastatin (LIPITOR) 10 MG tablet TAKE 1 TABLET BY MOUTH EVERY DAY 90 tablet 2   bisoprolol-hydrochlorothiazide (ZIAC) 2.5-6.25 MG tablet TAKE 1 TABLET BY MOUTH EVERY DAY 90  tablet 1   CALCIUM-MAGNESIUM-ZINC PO Take by mouth.     Cholecalciferol (VITAMIN D3) 10 MCG (400 UNIT) CAPS Take 10 mcg by mouth.     clobetasol ointment (TEMOVATE) 0.05 % Apply twice daily to vulva externally     co-enzyme Q-10 30 MG capsule Take 200 mg by mouth daily.     diclofenac sodium (VOLTAREN) 1 % GEL Apply topically to affected area qid 100 g 1   EPINEPHrine (EPIPEN JR) 0.15 MG/0.3ML injection Inject 0.15 mg into the muscle as needed. (Patient not taking: No sig reported)     hydrocortisone 2.5 % cream Apply topically in the morning and at bedtime.     ibuprofen (ADVIL,MOTRIN) 200 MG tablet Take 200 mg by mouth every 6 (six) hours as needed for headache, mild pain or cramping.     levothyroxine (SYNTHROID) 75 MCG tablet TAKE 1 TABLET BY MOUTH EVERY DAY 90 tablet 2   Multiple Vitamins-Minerals (WOMENS 50+ ADVANCED PO) Take 2 tablets by mouth daily.     omeprazole (PRILOSEC) 20 MG capsule TAKE 1-2 CAPSULEs BY MOUTH EVERY DAY 180 capsule 3   oxybutynin (DITROPAN-XL) 5 MG 24 hr tablet Take by mouth.     Saccharomyces boulardii (PROBIOTIC) 250 MG CAPS Take by mouth.     vitamin B-12 (CYANOCOBALAMIN) 1000 MCG tablet Take 1,000 mcg by mouth daily.     No current facility-administered medications for this visit.     Objective:  BP 128/84   Pulse 71  Temp 98.2 F (36.8 C) (Temporal)   Ht '5\' 4"'$  (1.626 m)   Wt 173 lb 12.8 oz (78.8 kg)   SpO2 98%   BMI 29.83 kg/m  Gen: NAD, resting comfortably CV: RRR no murmurs rubs or gallops Lungs: CTAB no crackles, wheeze, rhonchi Abdomen: soft/nontender/nondistended/normal bowel sounds.  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 A lot of drama/stress/concern. Only other democrat on the board is very ill- a lot of stress with this all.  - had her twin grandkids here and had a good visit  #hyperlipidemia-LDL goal under 70 S:  Medication: Atorvastatin 10 mg. Weight stable from last visit Lab Results  Component Value Date   CHOL 134 01/04/2021   HDL 43 (L) 01/04/2021   LDLCALC 74 01/04/2021   LDLDIRECT 73.0 03/01/2018   TRIG 91 01/04/2021   CHOLHDL 3.1 01/04/2021   A/P: close to ideal control- reasonable for primary prevention- continue current meds  #hypothyroidism S: compliant On thyroid medication- Synthroid 75Mcg   Lab Results  Component Value Date   TSH 1.14 01/04/2021  A/P:Stable. Continue current medications.   #hypertension S: medication: Ziac 2.5-6.'25Mg'$   Home readings #s: not recently checked BP Readings from Last 3 Encounters:  04/15/21 128/84  04/15/21 138/72  03/19/21 132/80  A/P: Stable. Continue current medications.  -she may bring cuff by as not sure if calibrated correctly to next visit  # B12 deficiency S: Current treatment/medication (oral vs. IM): Vitamin b12 1000Mcg. PPI could have caused Lab Results  Component Value Date   [id number] 06/11/2020  A/P: likely stable- continue oral b12 - recheck next visit  #Overactive bladder-on oxybutynin  through urology  #Lung nodule-largely stable March 31, 2019 with plan for 2-year repeat  Nodule 03/10/16- 3 month CT.  01/09/17 recommended 2 year fu up for 5 years total.  Normal 2020-repeat now and will be last imaging as long as stable (5 years total from 2017)  #Headaches- wakes up in the AM with headaches - may wake up 3-4 AM and go back to sleep after tylenol- by 8 or 9 often gone within an hour. More left sided. Perhaps 1-2 x a week. Started 1-2 months ago. Head trauma in April and had CT at that time was having headache in that area that day. Increasing some in frequency- intensity not increasing. This doesn't feel like her prior migraines. Feels like sinus pressure behind left eye. Usually resolves within 1-2 hours with tylenol.   Recommended follow up: Return in about 4 months (around 08/15/2021) for follow up- or sooner if  needed. Future Appointments  Date Time Provider [place]  06/19/2021  2:00 PM Lyndal Pulley, DO [facility] None  04/21/2022  3:15 PM [facility] HEALTH COACH [facility]   Lab/Order associations:   ICD-10-CM   1. Solitary pulmonary nodule  R91.1 CT Chest Wo Contrast    2. Vitamin B 12 deficiency  E53.8     3. Hypothyroidism, unspecified type  E03.9     4. Hyperlipidemia, unspecified hyperlipidemia type  E78.5     5. Essential hypertension  I10       No orders of the defined types were placed in this encounter.   Return precautions advised.  Garret Reddish, MD

## 2021-04-15 NOTE — Patient Instructions (Addendum)
Health Maintenance Due  Topic Date Due   INFLUENZA VACCINE - please let us know when you give [place] your flu shot 04/15/2021   You are eligible to schedule your annual wellness visit with our nurse specialist Otila Kluver.  Please consider scheduling this before you leave today  We will call you within two weeks about your referral to CT of the lungs for lung nodule follow up plus CT head due to left sided headache. If you do not hear within 2 weeks, give [place] a call. Even if CT ok of head - if symptoms worsen please let us know   Recommended follow up: Return in about 4 months (around 08/15/2021) for follow up- or sooner if needed. If you have new or worsening symptoms please let us know ASAP or seek care

## 2021-04-25 ENCOUNTER — Ambulatory Visit (HOSPITAL_BASED_OUTPATIENT_CLINIC_OR_DEPARTMENT_OTHER)
Admission: RE | Admit: 2021-04-25 | Discharge: 2021-04-25 | Disposition: A | Discharge status: Home / Self Care | Payer: Medicare Other | Source: Ambulatory Visit | Attending: Family Medicine | Admitting: Family Medicine

## 2021-04-25 ENCOUNTER — Other Ambulatory Visit: Payer: Self-pay

## 2021-04-25 DIAGNOSIS — R911 Solitary pulmonary nodule: Secondary | ICD-10-CM | POA: Insufficient documentation

## 2021-04-25 DIAGNOSIS — Z87828 Personal history of other (healed) physical injury and trauma: Secondary | ICD-10-CM | POA: Diagnosis not present

## 2021-04-25 DIAGNOSIS — I700 Atherosclerosis of aorta: Secondary | ICD-10-CM | POA: Diagnosis not present

## 2021-04-25 DIAGNOSIS — R519 Headache, unspecified: Secondary | ICD-10-CM | POA: Diagnosis not present

## 2021-04-25 DIAGNOSIS — G44309 Post-traumatic headache, unspecified, not intractable: Secondary | ICD-10-CM | POA: Diagnosis not present

## 2021-04-27 ENCOUNTER — Encounter: Payer: Self-pay | Admitting: Family Medicine

## 2021-04-27 DIAGNOSIS — I700 Atherosclerosis of aorta: Secondary | ICD-10-CM | POA: Insufficient documentation

## 2021-05-29 ENCOUNTER — Telehealth: Payer: Self-pay | Admitting: Pharmacist

## 2021-05-29 NOTE — Chronic Care Management (AMB) (Addendum)
Chronic Care Management Pharmacy Assistant   Name: Kelly Frazier  MRN: [id number] DOB: Jan 13, 1946  Reason for Encounter: General Adherence Call   Recent office visits:  04/15/2021 [facility] (PCP) Kelly Olp, MD; chronic follow up, no medication changes indicated.  01/04/2021 OV (PCP) Kelly Olp, MD; visit for fall, all x-rays are unremarkable, referral for PT, no medication changes noted.  12/21/2020 OV Frazier, Kelly Evens, PA-C; visit for fall downstairs, still unable to perform ADLs. No medication changes indicated.  Recent consult visits:  03/19/2021 [facility] (Sports Medicine) Hulan Saas M, DO; visit for OA of both knees, no medication changes indicated.  02/06/2021 [facility] (Sports Medicine) Hulan Saas M, DO; visit for neck pain, no medication changes indicated.  12/20/2020 [facility] (Sports Medicine) Hulan Saas M, DO; visit for bilateral knee pain, bedside commode ordered, no medication changes indicated.  Hospital visits:  12/19/2020 ED Kelly Dessert, MD; Fall down 8 stairs, imaging obtained does not identify acute injury. Rx Hydrocodone for pain  Medications: Outpatient Encounter Medications as of 05/29/2021  Medication Sig   Ascorbic Acid (VITAMIN C WITH ROSE HIPS) 500 MG tablet Take 500 mg by mouth daily.   atorvastatin (LIPITOR) 10 MG tablet TAKE 1 TABLET BY MOUTH EVERY DAY   bisoprolol-hydrochlorothiazide (ZIAC) 2.5-6.25 MG tablet TAKE 1 TABLET BY MOUTH EVERY DAY   CALCIUM-MAGNESIUM-ZINC PO Take by mouth.   Cholecalciferol (VITAMIN D3) 10 MCG (400 UNIT) CAPS Take 10 mcg by mouth.   clobetasol ointment (TEMOVATE) 0.05 % Apply twice daily to vulva externally   co-enzyme Q-10 30 MG capsule Take 200 mg by mouth daily.   diclofenac sodium (VOLTAREN) 1 % GEL Apply topically to affected area qid   EPINEPHrine (EPIPEN JR) 0.15 MG/0.3ML injection Inject 0.15 mg into the muscle as needed. (Patient not taking: No sig reported)   hydrocortisone 2.5 % cream Apply topically in  the morning and at bedtime.   ibuprofen (ADVIL,MOTRIN) 200 MG tablet Take 200 mg by mouth every 6 (six) hours as needed for headache, mild pain or cramping.   levothyroxine (SYNTHROID) 75 MCG tablet TAKE 1 TABLET BY MOUTH EVERY DAY   Multiple Vitamins-Minerals (WOMENS 50+ ADVANCED PO) Take 2 tablets by mouth daily.   omeprazole (PRILOSEC) 20 MG capsule TAKE 1-2 CAPSULEs BY MOUTH EVERY DAY   oxybutynin (DITROPAN-XL) 5 MG 24 hr tablet Take by mouth.   Saccharomyces boulardii (PROBIOTIC) 250 MG CAPS Take by mouth.   vitamin B-12 (CYANOCOBALAMIN) 1000 MCG tablet Take 1,000 mcg by mouth daily.   No facility-administered encounter medications on file as of 05/29/2021.   Patient Questions:  Have you had any problems recently with your health?  Patient states she still doesn't have much energy, she is still healing from her fall. Patient states she no longer has to use a walker to get around. She is getting around well without any support. She states she keeps a cane in her trunk just in case if she needs it.   Patient states she is still having some headaches behind her left eye. She recently had an MRI that she will discuss with Dr. Yong Channel.  Have you had any problems with your pharmacy? Patient states she has not had any problems recently with her pharmacy.  What issues or side effects are you having with your medications? Patient states clobetasol is very expensive and wanted to know if there are any cheaper alternatives that she can try?  Patient also states oxybutynin is too expensive. She would like to  apply for some type of benefit program. (I sent patient an email of a snippet of Retailers that offer low cash price for this medication.  What would you like me to pass along to Kelly Frazier, CPP for him to help you with?  Patient would like to know if there is a cheaper alternative to clobetasol ointment.  What can we do to take care of you better? Patient did not have any suggestions. She  is happy with her current level of care.  Email [email or web address]   Care Gaps: Medicare Annual Wellness: last AWV 04/15/2021 Hemoglobin A1C: 5.7% on 02/21/2016 Colonoscopy: Next due 06/29/2022 Dexa Scan: Completed Mammogram: Next due on 07/09/2022 HPV Vaccines: Aged Out  Future Appointments  Date Time Provider [place]  06/10/2021 10:00 AM Kelly Olp, MD [facility]  06/19/2021  2:00 PM Lyndal Pulley, DO [facility] None  10/18/2021  1:00 PM Kelly Olp, MD [facility]  04/21/2022  3:15 PM [facility] HEALTH COACH [facility]     Star Rating Drugs: Atorvastatin 10 mg last filled 03/27/2021 90 DS  April D Calhoun, [place] Pharmacist Assistant [phone]  7 minutes spent in review, coordination, and documentation.  She could try mometasone which is another topical in the same drug class.  However I would imagine the price is similar.  She could try to use GoodRx.  Reviewed by: Beverly Milch, PharmD Clinical Pharmacist [phone]

## 2021-05-30 DIAGNOSIS — Z23 Encounter for immunization: Secondary | ICD-10-CM | POA: Diagnosis not present

## 2021-06-06 DIAGNOSIS — D1801 Hemangioma of skin and subcutaneous tissue: Secondary | ICD-10-CM | POA: Diagnosis not present

## 2021-06-06 DIAGNOSIS — L738 Other specified follicular disorders: Secondary | ICD-10-CM | POA: Diagnosis not present

## 2021-06-06 DIAGNOSIS — L570 Actinic keratosis: Secondary | ICD-10-CM | POA: Diagnosis not present

## 2021-06-06 DIAGNOSIS — Z85828 Personal history of other malignant neoplasm of skin: Secondary | ICD-10-CM | POA: Diagnosis not present

## 2021-06-07 NOTE — Progress Notes (Signed)
Phone [phone] Virtual visit via Video note   Subjective:  Chief complaint: Chief Complaint  Patient presents with   aortic atherosclerosis    Discuss results.     This visit type was conducted due to national recommendations for restrictions regarding the COVID-19 Pandemic (e.g. social distancing).  This format is felt to be most appropriate for this patient at this time balancing risks to patient and risks to population by having him in for in person visit.  No physical exam was performed (except for noted visual exam or audio findings with Telehealth visits).    Our team/I connected with [name] at 10:00 AM EDT by a video enabled telemedicine application (doxy.me or caregility through epic) and verified that I am speaking with the correct person using two identifiers.  Location patient: [place] Location provider: [facility], office Persons participating in the virtual visit:  patient  Our team/I discussed the limitations of evaluation and management by telemedicine and the availability of in person appointments. In light of current covid-19 pandemic, patient also understands that we are trying to protect them by minimizing in office contact if at all possible.  The patient expressed consent for telemedicine visit and agreed to proceed. Patient understands insurance will be billed.   Past Medical History-  Patient Active Problem List   Diagnosis Date Noted   Aortic atherosclerosis ([place]) 04/27/2021    Priority: Medium   Vitamin B 12 deficiency 09/22/2018    Priority: Medium   Solitary pulmonary nodule 01/10/2017    Priority: Medium   Migraine 08/17/2014    Priority: Medium   Hyperlipidemia 05/03/2007    Priority: Medium   Essential hypertension 05/03/2007    Priority: Medium   Hypothyroidism 04/12/2007    Priority: Medium   Degenerative arthritis of knee, bilateral 02/18/2019    Priority: Low   Arthralgia of both knees 11/13/2017    Priority: Low   Trigger  finger, left 08/03/2017    Priority: Low   Esophageal reflux 09/03/2010    Priority: Low   Angioedema 06/21/2010    Priority: Low   History of colonic polyps 09/27/2007    Priority: Low   Osteoarthritis 04/12/2007    Priority: Low   Left shoulder pain 02/06/2021   AC (acromioclavicular) arthritis 02/06/2021   Hematoma of left hip 12/26/2020   Snoring 06/05/2016   Excessive daytime sleepiness 06/05/2016   Shortness of breath 03/11/2016    Medications- reviewed and updated Current Outpatient Medications  Medication Sig Dispense Refill   Ascorbic Acid (VITAMIN C WITH ROSE HIPS) 500 MG tablet Take 500 mg by mouth daily.     atorvastatin (LIPITOR) 10 MG tablet TAKE 1 TABLET BY MOUTH EVERY DAY 90 tablet 2   CALCIUM-MAGNESIUM-ZINC PO Take by mouth.     Cholecalciferol (VITAMIN D3) 10 MCG (400 UNIT) CAPS Take 10 mcg by mouth.     clobetasol ointment (TEMOVATE) 0.05 % Apply twice daily to vulva externally     co-enzyme Q-10 30 MG capsule Take 200 mg by mouth daily.     diclofenac sodium (VOLTAREN) 1 % GEL Apply topically to affected area qid 100 g 1   EPINEPHrine (EPIPEN JR) 0.15 MG/0.3ML injection Inject 0.15 mg into the muscle as needed.     hydrocortisone 2.5 % cream Apply topically in the morning and at bedtime.     levothyroxine (SYNTHROID) 75 MCG tablet TAKE 1 TABLET BY MOUTH EVERY DAY 90 tablet 2   Multiple Vitamins-Minerals (WOMENS 50+ ADVANCED PO) Take 2 tablets  by mouth daily.     omeprazole (PRILOSEC) 20 MG capsule TAKE 1-2 CAPSULEs BY MOUTH EVERY DAY 180 capsule 3   oxybutynin (DITROPAN-XL) 5 MG 24 hr tablet Take by mouth.     Saccharomyces boulardii (PROBIOTIC) 250 MG CAPS Take by mouth.     vitamin B-12 (CYANOCOBALAMIN) 1000 MCG tablet Take 1,000 mcg by mouth daily.     No current facility-administered medications for this visit.     Objective:  no self reported vitals Gen: NAD, resting comfortably Lungs: nonlabored, normal respiratory rate  Skin: appears dry, no  obvious rash     Assessment and Plan   #hyperlipidemia/aortic atherosclerosis-LDL goal under 70 S: Medication: Atorvastatin 10 mg. Meat only twice a week and usually chicken. Tries to do diet rich in fruits and veggies- tried years ago to go full plant based- was not for her.  Lab Results  Component Value Date   CHOL 134 01/04/2021   HDL 43 (L) 01/04/2021   LDLCALC 74 01/04/2021   LDLDIRECT 73.0 03/01/2018   TRIG 91 01/04/2021   CHOLHDL 3.1 01/04/2021  A/P: Mild poor control in light of aortic atherosclerosis.  We discussed diagnosis of aortic atherosclerosis from most recent CT-and how this correlates with plaque findings with chronic microvascular ischemia-we discussed potentially increasing atorvastatin to 20 mg but patient would like to make some tweaks on lifestyle and recheck in April.  Discussed importance of risk factor modification including controlling blood pressure as well-see below  #hypertension S: medication: Ziac 2.5-6.25Mg   Home readings #s: has cuff- will bring to next visit - has not been able to get set up  BP Readings from Last 3 Encounters:  04/15/21 128/84  04/15/21 138/72  03/19/21 132/80  A/P: Blood pressure well controlled on most recent check-we discussed importance of continuing to control this to reduce likelihood of progression of aortic atherosclerosis or chronic microvascular ischemia and risks of heart attack or stroke.  # Memory loss S:patient has had some decrease in memory over last few years. Uses calendars to try to keep track.  Patient is concerned because she has had some traumatic impact to her head in the past as well.  From most recent CT "Brain: No evidence of acute infarction, hemorrhage, hydrocephalus, extra-axial collection or mass lesion/mass effect. Partially empty sella. Mild atrophy with ex vacuo ventricular dilation. Mild white matter hypoattenuation, likely chronic microvascular ischemic disease." A/P: Patient would like to get  neurology's expert opinion on memory loss, CT imaging findings as above-referral was placed today per her request  #Overactive bladder and incontinence-has seen urology in the past-remains on oxybutynin.  Request to see urogynecology-referral was placed today-prefers this to be within [facility].  Was having some billing issues with [facility] # HM_ flu shot a week ago - plans on omicron booster in another week. May switch to [place]  Recommended follow up: Keep February visit Future Appointments  Date Time Provider [place]  06/19/2021  2:00 PM Lyndal Pulley, DO [facility] None  10/18/2021  1:00 PM Marin Olp, MD [facility]  04/21/2022  3:15 PM [facility] HEALTH COACH [facility]    Lab/Order associations:   ICD-10-CM   1. Hyperlipidemia, unspecified hyperlipidemia type  E78.5     2. Essential hypertension  I10     3. Memory loss  R41.3 Ambulatory referral to Neurology    4. Urinary incontinence, unspecified type  R32 Ambulatory referral to Urogynecology    5. History of traumatic head injury  Z87.828 Ambulatory  referral to Neurology    6. Aortic atherosclerosis ([place])  I70.0      I,Jada Bradford,acting as a scribe for Garret Reddish, MD.,have documented all relevant documentation on the behalf of Garret Reddish, MD,as directed by  Garret Reddish, MD while in the presence of Garret Reddish, MD.   I, Garret Reddish, MD, have reviewed all documentation for this visit. The documentation on 06/10/21 for the exam, diagnosis, procedures, and orders are all accurate and complete.   Return precautions advised.  Garret Reddish, MD

## 2021-06-10 ENCOUNTER — Encounter: Payer: Self-pay | Admitting: Physician Assistant

## 2021-06-10 ENCOUNTER — Telehealth (INDEPENDENT_AMBULATORY_CARE_PROVIDER_SITE_OTHER): Payer: Medicare Other | Admitting: Family Medicine

## 2021-06-10 ENCOUNTER — Encounter: Payer: Self-pay | Admitting: Family Medicine

## 2021-06-10 DIAGNOSIS — I10 Essential (primary) hypertension: Secondary | ICD-10-CM

## 2021-06-10 DIAGNOSIS — R32 Unspecified urinary incontinence: Secondary | ICD-10-CM

## 2021-06-10 DIAGNOSIS — Z87828 Personal history of other (healed) physical injury and trauma: Secondary | ICD-10-CM | POA: Diagnosis not present

## 2021-06-10 DIAGNOSIS — E785 Hyperlipidemia, unspecified: Secondary | ICD-10-CM | POA: Diagnosis not present

## 2021-06-10 DIAGNOSIS — E538 Deficiency of other specified B group vitamins: Secondary | ICD-10-CM

## 2021-06-10 DIAGNOSIS — R413 Other amnesia: Secondary | ICD-10-CM

## 2021-06-10 DIAGNOSIS — E039 Hypothyroidism, unspecified: Secondary | ICD-10-CM

## 2021-06-10 DIAGNOSIS — I700 Atherosclerosis of aorta: Secondary | ICD-10-CM | POA: Diagnosis not present
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
## 2021-06-19 ENCOUNTER — Other Ambulatory Visit: Payer: Self-pay

## 2021-06-19 ENCOUNTER — Encounter: Payer: Self-pay | Admitting: Family Medicine

## 2021-06-19 ENCOUNTER — Ambulatory Visit (INDEPENDENT_AMBULATORY_CARE_PROVIDER_SITE_OTHER): Payer: Medicare Other | Admitting: Family Medicine

## 2021-06-19 DIAGNOSIS — M170 Bilateral primary osteoarthritis of knee: Secondary | ICD-10-CM

## 2021-06-19 NOTE — Assessment & Plan Note (Signed)
Patient has responded well to the viscosupplementation at this time.  Discussed icing regimen and home exercises.  Continue weight loss at least 6 months.  Patient will follow up again in 3 months and hopefully we will have approval for another round of gel injections if needed.  Patient of course wants to avoid surgical intervention if possible.

## 2021-06-19 NOTE — Patient Instructions (Signed)
Glad you are doing better See me in 3 months for gel injections Thanks for other information

## 2021-06-24 ENCOUNTER — Ambulatory Visit: Payer: Medicare Other | Admitting: Physician Assistant

## 2021-06-25 ENCOUNTER — Other Ambulatory Visit: Payer: Self-pay

## 2021-06-25 ENCOUNTER — Ambulatory Visit (INDEPENDENT_AMBULATORY_CARE_PROVIDER_SITE_OTHER): Payer: Medicare Other | Admitting: Physician Assistant

## 2021-06-25 ENCOUNTER — Encounter: Payer: Self-pay | Admitting: Physician Assistant

## 2021-06-25 VITALS — BP 154/82 | HR 83 | Resp 18 | Ht 64.0 in | Wt 173.0 lb

## 2021-06-25 DIAGNOSIS — R413 Other amnesia: Secondary | ICD-10-CM | POA: Diagnosis not present

## 2021-06-25 NOTE — Patient Instructions (Addendum)
It was a pleasure to see you today at our office.   Recommendations:  MRI of the brain, the radiology office will call you to arrange you appointment Follow up  pending results   RECOMMENDATIONS FOR ALL PATIENTS WITH MEMORY PROBLEMS: 1. Continue to exercise (Recommend 30 minutes of walking everyday, or 3 hours every week) 2. Increase social interactions - continue going to [place] and enjoy social gatherings with friends and family 3. Eat healthy, avoid fried foods and eat more fruits and vegetables 4. Maintain adequate blood pressure, blood sugar, and blood cholesterol level. Reducing the risk of stroke and cardiovascular disease also helps promoting better memory. 5. Avoid stressful situations. Live a simple life and avoid aggravations. Organize your time and prepare for the next day in anticipation. 6. Sleep well, avoid any interruptions of sleep and avoid any distractions in the bedroom that may interfere with adequate sleep quality 7. Avoid sugar, avoid sweets as there is a strong link between excessive sugar intake, diabetes, and cognitive impairment We discussed the Mediterranean diet, which has been shown to help patients reduce the risk of progressive memory disorders and reduces cardiovascular risk. This includes eating fish, eat fruits and green leafy vegetables, nuts like almonds and hazelnuts, walnuts, and also use olive oil. Avoid fast foods and fried foods as much as possible. Avoid sweets and sugar as sugar use has been linked to worsening of memory function.  There is always a concern of gradual progression of memory problems. If this is the case, then we may need to adjust level of care according to patient needs. Support, both to the patient and caregiver, should then be put into place.     FALL PRECAUTIONS: Be cautious when walking. Scan the area for obstacles that may increase the risk of trips and falls. When getting up in the mornings, sit up at the edge of the bed for a  few minutes before getting out of bed. Consider elevating the bed at the head end to avoid drop of blood pressure when getting up. Walk always in a well-lit room (use night lights in the walls). Avoid area rugs or power cords from appliances in the middle of the walkways. Use a walker or a cane if necessary and consider physical therapy for balance exercise. Get your eyesight checked regularly.  FINANCIAL OVERSIGHT: Supervision, especially oversight when making financial decisions or transactions is also recommended.  HOME SAFETY: Consider the safety of the kitchen when operating appliances like stoves, microwave oven, and blender. Consider having supervision and share cooking responsibilities until no longer able to participate in those. Accidents with firearms and other hazards in the house should be identified and addressed as well.   ABILITY TO BE LEFT ALONE: If patient is unable to contact 911 operator, consider using LifeLine, or when the need is there, arrange for someone to stay with patients. Smoking is a fire hazard, consider supervision or cessation. Risk of wandering should be assessed by caregiver and if detected at any point, supervision and safe proof recommendations should be instituted.  MEDICATION SUPERVISION: Inability to self-administer medication needs to be constantly addressed. Implement a mechanism to ensure safe administration of the medications.   DRIVING: Regarding driving, in patients with progressive memory problems, driving will be impaired. We advise to have someone else do the driving if trouble finding directions or if minor accidents are reported. Independent driving assessment is available to determine safety of driving.   If you are interested in the driving assessment, you  can contact the following:  [employer] in [place]  [place]  [phone] or [phone]    [place] refers to food and lifestyle choices that are based on the traditions of countries located on the [employer]. This way of eating has been shown to help prevent certain conditions and improve outcomes for people who have chronic diseases, like kidney disease and heart disease. What are tips for following this plan? Lifestyle  Cook and eat meals together with your family, when possible. Drink enough fluid to keep your urine clear or pale yellow. Be physically active every day. This includes: Aerobic exercise like running or swimming. Leisure activities like gardening, walking, or housework. Get 7-8 hours of sleep each night. If recommended by your health care provider, drink red wine in moderation. This means 1 glass a day for nonpregnant women and 2 glasses a day for men. A glass of wine equals 5 oz (150 mL). Reading food labels  Check the serving size of packaged foods. For foods such as rice and pasta, the serving size refers to the amount of cooked product, not dry. Check the total fat in packaged foods. Avoid foods that have saturated fat or trans fats. Check the ingredients list for added sugars, such as corn syrup. Shopping  At the grocery store, buy most of your food from the areas near the walls of the store. This includes: Fresh fruits and vegetables (produce). Grains, beans, nuts, and seeds. Some of these may be available in unpackaged forms or large amounts (in bulk). Fresh seafood. Poultry and eggs. Low-fat dairy products. Buy whole ingredients instead of prepackaged foods. Buy fresh fruits and vegetables in-season from local farmers markets. Buy frozen fruits and vegetables in resealable bags. If you do not have access to quality fresh seafood, buy precooked frozen shrimp or canned fish, such as tuna, salmon, or sardines. Buy small amounts of raw or cooked vegetables, salads, or olives from the  deli or salad bar at your store. Stock your pantry so you always have certain foods on hand, such as olive oil, canned tuna, canned tomatoes, rice, pasta, and beans. Cooking  Cook foods with extra-virgin olive oil instead of using butter or other vegetable oils. Have meat as a side dish, and have vegetables or grains as your main dish. This means having meat in small portions or adding small amounts of meat to foods like pasta or stew. Use beans or vegetables instead of meat in common dishes like chili or lasagna. Experiment with different cooking methods. Try roasting or broiling vegetables instead of steaming or sauteing them. Add frozen vegetables to soups, stews, pasta, or rice. Add nuts or seeds for added healthy fat at each meal. You can add these to yogurt, salads, or vegetable dishes. Marinate fish or vegetables using olive oil, lemon juice, garlic, and fresh herbs. Meal planning  Plan to eat 1 vegetarian meal one day each week. Try to work up to 2 vegetarian meals, if possible. Eat seafood 2 or more times a week. Have healthy snacks readily available, such as: Vegetable sticks with hummus. Greek yogurt. Fruit and nut trail mix. Eat balanced meals throughout the week. This includes: Fruit: 2-3 servings a day Vegetables: 4-5 servings a day Low-fat dairy: 2 servings a day Fish, poultry, or lean meat: 1 serving a day Beans and legumes: 2 or more servings a week Nuts and seeds: 1-2 servings a  day Whole grains: 6-8 servings a day Extra-virgin olive oil: 3-4 servings a day Limit red meat and sweets to only a few servings a month What are my food choices? Mediterranean diet Recommended Grains: Whole-grain pasta. Brown rice. Bulgar wheat. Polenta. Couscous. Whole-wheat bread. [place]. Vegetables: Artichokes. Beets. Broccoli. Cabbage. Carrots. Eggplant. Green beans. Chard. Kale. Spinach. Onions. Leeks. Peas. Squash. Tomatoes. Peppers. Radishes. Fruits: Apples. Apricots.  Avocado. Berries. Bananas. Cherries. Dates. Figs. Grapes. Lemons. Melon. Oranges. Peaches. Plums. Pomegranate. Meats and other protein foods: Beans. Almonds. Sunflower seeds. Pine nuts. Peanuts. [place]. Salmon. Scallops. Shrimp. [place]. Tilapia. Clams. Oysters. Eggs. Dairy: Low-fat milk. Cheese. Greek yogurt. Beverages: Water. Red wine. Herbal tea. Fats and oils: Extra virgin olive oil. Avocado oil. Grape seed oil. Sweets and desserts: [place] yogurt with honey. Baked apples. Poached pears. Trail mix. Seasoning and other foods: Basil. Cilantro. Coriander. Cumin. Mint. Parsley. Sage. Rosemary. Tarragon. Garlic. Oregano. Thyme. Pepper. Balsalmic vinegar. Tahini. Hummus. Tomato sauce. Olives. Mushrooms. Limit these Grains: Prepackaged pasta or rice dishes. Prepackaged cereal with added sugar. Vegetables: Deep fried potatoes (french fries). Fruits: Fruit canned in syrup. Meats and other protein foods: Beef. Pork. Lamb. Poultry with skin. Hot dogs. Bernie[place] Salines. Dairy: Ice cream. Sour cream. Whole milk. Beverages: Juice. Sugar-sweetened soft drinks. Beer. Liquor and spirits. Fats and oils: Butter. [place]ola oil. Vegetable oil. Beef fat (tallow). Lard. Sweets and desserts: Cookies. Cakes. Pies. Candy. Seasoning and other foods: Mayonnaise. Premade sauces and marinades. The items listed may not be a complete list. Talk with your dietitian about what dietary choices are right for you. Summary The Mediterranean diet includes both food and lifestyle choices. Eat a variety of fresh fruits and vegetables, beans, nuts, seeds, and whole grains. Limit the amount of red meat and sweets that you eat. Talk with your health care provider about whether it is safe for you to drink red wine in moderation. This means 1 glass a day for nonpregnant women and 2 glasses a day for men. A glass of wine equals 5 oz (150 mL). This information is not intended to replace advice given to you by your health care provider. Make sure you discuss  any questions you have with your health care provider. Document Released: 04/24/2016 Document Revised: 05/27/2016 Document Reviewed: 04/24/2016 Elsevier Interactive Patient Education  2017 [employer].   We have sent a referral to [facility] for your MRI and they will call you directly to schedule your appointment.

## 2021-06-25 NOTE — Progress Notes (Addendum)
Assessment/Plan:   Kelly Frazier is a 75 y.o. year old female with risk factors including  age, hypothyroidism, migraines, hypertension, B12 deficiency,  hyperlipidemia, seen today for evaluation of memory loss. MoCA today is 27/30 equals 29/30 MMSE, with very mild deficiencies in visuospatial executive, delayed recall 3/5.  Recent CT of the brain after the fall showed  no evidence of acute infarction, hemorrhage, hydrocephalus, extra-axial collection or mass lesion/mass effect. Partially empty sella. Mild atrophy with ex vacuo ventricular dilation. Mild white matter hypoattenuation, likely chronic microvascular ischemic disease.     Recommendations:   Memory Loss   MRI brain with/without contrast to assess for underlying structural abnormality and assess vascular load.  -She has a history of lung nodule since 2017, followed as an outpatient by PCP.Marland Kitchen Discussed safety both in and out of the home.  Discussed the importance of regular daily schedule with inclusion of crossword puzzles to maintain brain function.  Continue to monitor mood with PCP.  Stress management techniques are recommended. Counseling has been discussed with the patient, who politely declines at this time. Monitor cardiovascular risk factors Stay active at least 30 minutes at least 3 times a week.  Naps should be scheduled and should be no longer than 60 minutes and should not occur after 2 PM.  Mediterranean diet is recommended  Folllow up pending on the MRI results.  Subjective:    The patient is seen in neurologic consultation at the request of Marin Olp, MD for the evaluation of memory. The patient is here alone.This is a 75 y.o. year old female who has had memory issues for about   2 or 3 years. Recently, she had a mechanical fall, and after that she notices repeating herself, or asking the same questions.  She is politically active, and she used to be able to remember all the appointments, which now she  has to put to calendars.  This creates some stress and frustration on her. She lives alone, and she is estranged from her son for the last 3 years, which has added sadness to her.  Otherwise, she describes herself as always in a good mood, optimistic and upbeat.  She sleeps usually well, denies vivid dreams or sleepwalking, hallucinations or paranoia.  Denies leaving objects in unusual places.  She is independent with dressing and bathing.  She denies forgetting to take her medications on time, and she tries to keep in the folder all her bills so that she does not forget.  Her appetite is good, denies trouble swallowing, continues to cook and denies leaving the stove or the faucet on.  She ambulates without difficulty, although at times she reports feeling unsteady.  She does not like to use a walker or a cane.  She continues to drive, and denies becoming lost.  She has a history of headaches, more left than right-sided, about 1-2 times a week, resolved with Tylenol, gone in less than an hour.  Denies any double vision, dizziness, focal numbness or tingling, unilateral weakness or tremors.  She has a history of urinary incontinence, and denies diarrhea or constipation.  CT Brain: No evidence of acute infarction, hemorrhage, hydrocephalus, extra-axial collection or mass lesion/mass effect. Partially empty sella. Mild atrophy with ex vacuo ventricular dilation. Mild white matter hypoattenuation, likely chronic microvascular ischemic disease  Labs 01/04/2021 showed normal CBC TSH 1.14 Vitamin B12 760   Allergies  Allergen Reactions   Naprosyn [Naproxen] Swelling   Terbinafine Hcl Swelling    Lamisil  Current Outpatient Medications  Medication Instructions   atorvastatin (LIPITOR) 10 MG tablet TAKE 1 TABLET BY MOUTH EVERY DAY   CALCIUM-MAGNESIUM-ZINC PO Oral   clobetasol ointment (TEMOVATE) 0.05 % Apply twice daily to vulva externally   co-enzyme Q-10 200 mg, Oral, Daily   diclofenac sodium  (VOLTAREN) 1 % GEL Apply topically to affected area qid   EPINEPHrine (EPIPEN JR) 0.15 mg, Intramuscular, As needed,     hydrocortisone 2.5 % cream Topical, 2 times daily   levothyroxine (SYNTHROID) 75 MCG tablet TAKE 1 TABLET BY MOUTH EVERY DAY   Multiple Vitamins-Minerals (WOMENS 50+ ADVANCED PO) 2 tablets, Oral, Daily   omeprazole (PRILOSEC) 20 MG capsule TAKE 1-2 CAPSULEs BY MOUTH EVERY DAY   oxybutynin (DITROPAN-XL) 5 MG 24 hr tablet Oral   Saccharomyces boulardii (PROBIOTIC) 250 MG CAPS Oral   vitamin B-12 (CYANOCOBALAMIN) 1,000 mcg, Oral, Daily   vitamin C with rose hips 500 mg, Oral, Daily   Vitamin D3 10 mcg, Oral     VITALS:   Vitals:   06/25/21 0950  BP: (!) 154/82  Pulse: 83  Resp: 18  SpO2: 97%  Weight: 173 lb (78.5 kg)  Height: 5\' 4"  (1.626 m)    PHYSICAL EXAM   HEENT:  Normocephalic, atraumatic. The mucous membranes are moist. The superficial temporal arteries are without ropiness or tenderness. Cardiovascular: Regular rate and rhythm. Lungs: Clear to auscultation bilaterally. Neck: There are no carotid bruits noted bilaterally.  NEUROLOGICAL: Montreal Cognitive Assessment  06/25/2021  Visuospatial/ Executive (0/5) 4  Naming (0/3) 3  Attention: Read list of digits (0/2) 2  Attention: Read list of letters (0/1) 1  Attention: Serial 7 subtraction starting at 100 (0/3) 3  Language: Repeat phrase (0/2) 2  Language : Fluency (0/1) 1  Abstraction (0/2) 2  Delayed Recall (0/5) 3  Orientation (0/6) 6  Total 27  Adjusted Score (based on education) 27   No flowsheet data found.  No flowsheet data found.   Orientation:  Alert and oriented to person, place and time. No aphasia or dysarthria. Fund of knowledge is appropriate. Recent memory impaired and remote memory intact.  Attention and concentration are normal.  Able to name objects and repeat phrases. Delayed recall 3 /5 Cranial nerves: There is good facial symmetry. Extraocular muscles are intact and visual  fields are full to confrontational testing. Speech is fluent and clear. Soft palate rises symmetrically and there is no tongue deviation. Hearing is intact to conversational tone. Tone: Tone is good throughout. Sensation: Sensation is intact to light touch and pinprick throughout. Vibration is intact at the bilateral big toe.There is no extinction with double simultaneous stimulation. There is no sensory dermatomal level identified. Coordination: The patient has no difficulty with RAM's or FNF bilaterally. Normal finger to nose  Motor: Strength is 5/5 in the bilateral upper and lower extremities. There is no pronator drift. There are no fasciculations noted. DTR's: Deep tendon reflexes are 2/4 at the bilateral biceps, triceps, brachioradialis, patella and achilles.  Plantar responses are downgoing bilaterally. Gait and Station: The patient is able to ambulate without difficulty.The patient is able to heel toe walk without any difficulty.The patient is able to ambulate in a tandem fashion. The patient is able to stand in the Romberg position.     Thank you for allowing [place] the opportunity to participate in the care of this nice patient. Please do not hesitate to contact us for any questions or concerns.   Total time spent on today's visit was  60 minutes, including both face-to-face time and nonface-to-face time.  Time included that spent on review of records (prior notes available to me/labs/imaging if pertinent), discussing treatment and goals, answering patient's questions and coordinating care.  Cc:  Marin Olp, MD  Sharene Butters 06/25/2021 11:59 AM

## 2021-06-29 ENCOUNTER — Ambulatory Visit (HOSPITAL_BASED_OUTPATIENT_CLINIC_OR_DEPARTMENT_OTHER)
Admission: RE | Admit: 2021-06-29 | Discharge: 2021-06-29 | Disposition: A | Discharge status: Home / Self Care | Payer: Medicare Other | Source: Ambulatory Visit | Attending: Physician Assistant | Admitting: Physician Assistant

## 2021-06-29 ENCOUNTER — Other Ambulatory Visit: Payer: Self-pay

## 2021-06-29 DIAGNOSIS — R413 Other amnesia: Secondary | ICD-10-CM | POA: Insufficient documentation

## 2021-06-29 DIAGNOSIS — R519 Headache, unspecified: Secondary | ICD-10-CM | POA: Diagnosis not present

## 2021-06-29 MED ORDER — GADOBUTROL 1 MMOL/ML IV SOLN
7.5000 mL | Freq: Once | INTRAVENOUS | Status: AC | PRN
  Administered 2021-06-29: 7.5 mL via INTRAVENOUS

## 2021-07-01 ENCOUNTER — Encounter: Payer: Self-pay | Admitting: Physician Assistant

## 2021-07-02 ENCOUNTER — Other Ambulatory Visit: Payer: Self-pay

## 2021-07-02 DIAGNOSIS — R413 Other amnesia: Secondary | ICD-10-CM

## 2021-07-07 DIAGNOSIS — Z23 Encounter for immunization: Secondary | ICD-10-CM | POA: Diagnosis not present

## 2021-07-10 ENCOUNTER — Other Ambulatory Visit: Payer: Self-pay

## 2021-07-10 DIAGNOSIS — R413 Other amnesia: Secondary | ICD-10-CM

## 2021-07-11 ENCOUNTER — Other Ambulatory Visit: Payer: Self-pay

## 2021-07-11 ENCOUNTER — Ambulatory Visit (INDEPENDENT_AMBULATORY_CARE_PROVIDER_SITE_OTHER): Payer: Medicare Other | Admitting: Neurology

## 2021-07-11 DIAGNOSIS — R413 Other amnesia: Secondary | ICD-10-CM | POA: Diagnosis not present

## 2021-07-15 DIAGNOSIS — R030 Elevated blood-pressure reading, without diagnosis of hypertension: Secondary | ICD-10-CM | POA: Diagnosis not present

## 2021-07-15 DIAGNOSIS — D329 Benign neoplasm of meninges, unspecified: Secondary | ICD-10-CM | POA: Diagnosis not present

## 2021-07-17 NOTE — Procedures (Signed)
ELECTROENCEPHALOGRAM REPORT  Date of Study: 07/11/2021  Patient's Name: Kelly Frazier MRN: [id number] Date of Birth: [birth year]  Referring Provider: Dwyane Luo  Clinical History: This is a 75 year old woman with memory loss, brain MRI showed meningioma abutting posterior right temporal lobe. EEG to assess for epileptogenic potential in this region.  Medications: LIPITOR 10 MG tablet CALCIUM-MAGNESIUM-ZINC PO TEMOVATE 0.05 % EPIPEN JR co-enzyme Q-10 hydrocortisone 2.5 % cream SYNTHROID 75 MCG tablet WOMENS 50+ ADVANCED PO PRILOSEC 20 MG capsule DITROPAN-XL 5 MG 24 hr tablet PROBIOTIC 250 MG CAPS CYANOCOBALAMIN1,000 mcg vitamin C with rose hips Vitamin D3 ZIAC 2.5-6.25 MG tablet  Technical Summary: A multichannel digital EEG recording measured by the international 10-20 system with electrodes applied with paste and impedances below 5000 ohms performed in our laboratory with EKG monitoring in an awake and asleep patient.  Hyperventilation was not performed. Photic stimulation was performed.  The digital EEG was referentially recorded, reformatted, and digitally filtered in a variety of bipolar and referential montages for optimal display.    Description: The patient is awake and asleep during the recording.  During maximal wakefulness, there is a symmetric, medium voltage 9 Hz posterior dominant rhythm that attenuates with eye opening.  The record is symmetric.  During drowsiness and sleep, there is an increase in theta slowing of the background.  Vertex waves and symmetric sleep spindles were seen.  Photic stimulation did not elicit any abnormalities.  There were no epileptiform discharges or electrographic seizures seen.    EKG lead was unremarkable.  Impression: This awake and asleep EEG is normal.    Clinical Correlation: A normal EEG does not exclude a clinical diagnosis of epilepsy.  If further clinical questions remain, prolonged EEG may be helpful.  Clinical  correlation is advised.   Ellouise Newer, M.D.

## 2021-07-25 DIAGNOSIS — D329 Benign neoplasm of meninges, unspecified: Secondary | ICD-10-CM | POA: Diagnosis not present

## 2021-07-30 ENCOUNTER — Ambulatory Visit (INDEPENDENT_AMBULATORY_CARE_PROVIDER_SITE_OTHER): Payer: Medicare Other | Admitting: Obstetrics and Gynecology

## 2021-07-30 ENCOUNTER — Encounter: Payer: Self-pay | Admitting: Obstetrics and Gynecology

## 2021-07-30 ENCOUNTER — Other Ambulatory Visit: Payer: Self-pay

## 2021-07-30 VITALS — BP 148/102 | HR 80 | Ht 62.0 in | Wt 173.0 lb

## 2021-07-30 DIAGNOSIS — N3000 Acute cystitis without hematuria: Secondary | ICD-10-CM

## 2021-07-30 DIAGNOSIS — N3281 Overactive bladder: Secondary | ICD-10-CM | POA: Diagnosis not present

## 2021-07-30 DIAGNOSIS — R159 Full incontinence of feces: Secondary | ICD-10-CM | POA: Diagnosis not present

## 2021-07-30 DIAGNOSIS — R82998 Other abnormal findings in urine: Secondary | ICD-10-CM

## 2021-07-30 DIAGNOSIS — R350 Frequency of micturition: Secondary | ICD-10-CM | POA: Diagnosis not present

## 2021-07-30 DIAGNOSIS — L280 Lichen simplex chronicus: Secondary | ICD-10-CM | POA: Diagnosis not present

## 2021-07-30 LAB — POCT URINALYSIS DIPSTICK
Appearance: ABNORMAL
Bilirubin, UA: NEGATIVE
Blood, UA: NEGATIVE
Glucose, UA: NEGATIVE
Ketones, UA: NEGATIVE
Nitrite, UA: POSITIVE
Protein, UA: NEGATIVE
Spec Grav, UA: 1.015 (ref 1.010–1.025)
Urobilinogen, UA: 0.2 E.U./dL
pH, UA: 7 (ref 5.0–8.0)

## 2021-07-30 MED ORDER — VIBEGRON 75 MG PO TABS: 1.0000 | ORAL_TABLET | Freq: Every day | ORAL | 5 refills | Status: DC

## 2021-07-30 MED ORDER — CLOBETASOL PROP EMOLLIENT BASE 0.05 % EX CREA: 1.0000 "application " | TOPICAL_CREAM | Freq: Every evening | CUTANEOUS | 11 refills | Status: DC
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
## 2021-07-30 NOTE — Patient Instructions (Signed)
Accidental Bowel Leakage: Our goal is to achieve formed bowel movements daily or every-other-day without leakage.  You may need to try different combinations of the following options to find what works best for you.  Some management options include: Dietary changes (more leafy greens, vegetables and fruits; less processed foods) Fiber supplementation (Metamucil or something with psyllium as active ingredient) Over-the-counter imodium (tablets or liquid) to help solidify the stool and prevent leakage of stool. If you get constipated you can use Miralax as needed to achieve bowel movements.

## 2021-08-01 ENCOUNTER — Telehealth: Payer: Self-pay | Admitting: Obstetrics and Gynecology

## 2021-08-01 ENCOUNTER — Encounter: Payer: Self-pay | Admitting: Obstetrics and Gynecology

## 2021-08-01 DIAGNOSIS — N3000 Acute cystitis without hematuria: Secondary | ICD-10-CM

## 2021-08-01 NOTE — Telephone Encounter (Signed)
Returned pt call- no answer, LVM

## 2021-08-02 MED ORDER — SULFAMETHOXAZOLE-TRIMETHOPRIM 800-160 MG PO TABS: 1.0000 | ORAL_TABLET | Freq: Two times a day (BID) | ORAL | 0 refills | Status: AC

## 2021-08-11 LAB — URINE CULTURE

## 2021-08-12 MED ORDER — SULFAMETHOXAZOLE-TRIMETHOPRIM 800-160 MG PO TABS: 1.0000 | ORAL_TABLET | Freq: Two times a day (BID) | ORAL | 0 refills | Status: AC

## 2021-08-12 NOTE — Addendum Note (Signed)
Addended by: Jaquita Folds on: 08/12/2021 03:17 PM   Modules accepted: Orders

## 2021-08-12 NOTE — Progress Notes (Signed)
LM on the VM for the pt to call back re: urine culture results

## 2021-08-21 DIAGNOSIS — D329 Benign neoplasm of meninges, unspecified: Secondary | ICD-10-CM | POA: Diagnosis not present

## 2021-08-28 ENCOUNTER — Encounter: Payer: Self-pay | Admitting: Physical Therapy

## 2021-08-28 ENCOUNTER — Other Ambulatory Visit: Payer: Self-pay

## 2021-08-28 ENCOUNTER — Ambulatory Visit: Payer: Medicare Other | Attending: Family Medicine | Admitting: Physical Therapy

## 2021-08-28 DIAGNOSIS — R279 Unspecified lack of coordination: Secondary | ICD-10-CM | POA: Insufficient documentation

## 2021-08-28 DIAGNOSIS — M6281 Muscle weakness (generalized): Secondary | ICD-10-CM | POA: Diagnosis not present

## 2021-08-28 NOTE — Therapy (Signed)
[facility] @ [address] [place], [place], [address] Phone: [phone]   Fax:  [phone]  Physical Therapy Evaluation  Patient Details  Name: Kelly Frazier MRN: [id number] Date of Birth: 1946/01/22 Referring Provider (PT): Jaquita Folds, MD   Encounter Date: 08/28/2021   PT End of Session - 08/28/21 1453     Visit Number 1    Date for PT Re-Evaluation 11/20/21    Authorization Type medicare A/B    PT Start Time 1403    PT Stop Time 5027    PT Time Calculation (min) 40 min    Behavior During Therapy [facility] for tasks assessed/performed             Past Medical History:  Diagnosis Date   Allergy    Arthritis    OA   Cancer ([place]) 1980's   basal cell carcinoma   Cataract    bilateral 2 yrs ago    Diverticulosis    Excessive daytime sleepiness 06/05/2016   GERD (gastroesophageal reflux disease)    Head injury due to trauma    s/p attack - some memory loss    Headache(784.0)    Hx of colonic polyps    Hyperlipidemia    no meds    Hypertension    pt denies- on  meds    Thyroid disease     Past Surgical History:  Procedure Laterality Date   AXILLARY ABCESS IRRIGATION AND DEBRIDEMENT     cyst   bcca from eyelid     basal cell 1980s.    BREAST BIOPSY  2006   and 2017   CATARACT EXTRACTION, BILATERAL     COLONOSCOPY  08/17/06   NSVD  1971   x1   osseous implant     POLYPECTOMY     removal of moles and denmatoid      There were no vitals filed for this visit.    Subjective Assessment - 08/28/21 1408     Subjective Pt has had the leakage for 30-40 years and just getting worse over time.  I try doing kegels but doesn't work.  I use a size 3 pad now.  Some days no leakage.  Uses 1 pad per day.  Driving and sitting for a few hours is the worst and everything just goes from the bladder.  The fecal incontinence is when doing a lot of walking sometimes up and down the isles at the grocery store. I  am regular 1-2 BMs in the morning. Walking can stimulate more urgency even after that.  I void at least 6/day and get up at least 2/night.  I drink about 32-40 oz water per day.    Patient Stated Goals stop having leakage    Currently in Pain? No/denies                [facility] PT Assessment - 08/29/21 0001       Assessment   Medical Diagnosis N32.81 (ICD-10-CM) - Overactive bladder; R15.9 (ICD-10-CM) - Incontinence of feces, unspecified fecal incontinence type    Referring Provider (PT) Jaquita Folds, MD    Onset Date/Surgical Date --   30-40 years   Prior Therapy No      Precautions   Precautions None      Balance Screen   Has the patient fallen in the past 6 months Yes    How many times? 1    Has the patient had a decrease in  activity level because of a fear of falling?  Yes    Is the patient reluctant to leave their home because of a fear of falling?  No      Home Ecologist residence    Living Arrangements Alone      Prior Function   Level of Independence Independent    IT trainer work    Contractor   Overall Cognitive Status Within Functional Limits for tasks assessed      Functional Tests   Functional tests Sit to Stand;Single leg stance      Single Leg Stance   Comments Rt LE wobbly and LOB after 3 sec; Lt 5 sec      Sit to Stand   Comments 5x in 15 sec - starts to fatigue and slow at the end; rotates to the Rt and leans slightly to the Rt      Posture/Postural Control   Posture/Postural Control Postural limitations    Postural Limitations Rounded Shoulders;Increased thoracic kyphosis      ROM / Strength   AROM / PROM / Strength AROM;Strength      AROM   Overall AROM Comments lumbar flexion 80%      Strength   Overall Strength Comments hip abduction 4/5      Flexibility   Soft Tissue Assessment /Muscle Length yes    Hamstrings 90% bil      Ambulation/Gait    Gait Pattern Within Functional Limits                         Objective measurements completed on examination: See above findings.     Pelvic Floor Special Questions - 08/29/21 0001     Number of Pregnancies 1    Number of Vaginal Deliveries 1    Urinary Leakage Yes    How often daily    Pad use 1 level 3 pad    Activities that cause leaking With strong urge;Coughing;Walking    Urinary urgency Yes    Urinary frequency nocturia 2/night and leaks a little    Fecal incontinence Yes    Pelvic Floor Internal Exam pt identity confimred and internal assessment performed    Exam Type Vaginal    Palpation no tenderness but difficulty relaxing completely after tightening pelvic floor    Strength weak squeeze, no lift    Strength # of reps 1    Strength # of seconds 1    Tone normal                        PT Education - 08/29/21 0918     Education Details Access Code: [email or web address]    Person(s) Educated Patient    Methods Explanation;Demonstration;Tactile cues;Verbal cues    Comprehension Verbalized understanding;Returned demonstration                 PT Long Term Goals - 08/28/21 1448       PT LONG TERM GOAL #1   Title Independent with advanced HEP as indicated    Time 12    Period Weeks    Status New    Target Date 11/20/21      PT LONG TERM GOAL #2   Title Pt will demonstrate 5x sit to stand of <12 seconds    Baseline 15 seconds    Time 12    Period Weeks  Status New    Target Date 11/20/21      PT LONG TERM GOAL #3   Title Pt will report at least 75% less urgency due to improved pelvic floor strength and coordination    Time 12    Period Weeks    Status New    Target Date 11/20/21      PT LONG TERM GOAL #4   Title Pt will report at least 60% less leakage    Time 12    Period Weeks    Status New    Target Date 11/20/21      PT LONG TERM GOAL #5   Title Pt will be able to drive 2-3 hours without leakage due to improved  muscle strength    Time 12    Period Weeks    Status New    Target Date 11/20/21                    Plan - 08/29/21 0906     Clinical Impression Statement Pt presents to clinic today due to bladder and fecal incontinence.  Pt has posture limitations as mentioned.  She has some hip weakness bil. Pt has pelvic floor strength of 2/5with low endurance of only 1 sec.  Pt has a hard time relaxing after doing one kegel.  Pt was educated on breathing getting muscle tone back down to resting after so she was able to do another one. Pt will benefit from skilled PT to address these impairments so she can do daily activities without leakage    Examination-Activity Limitations Continence    Stability/Clinical Decision Making Evolving/Moderate complexity    Clinical Decision Making Low    Rehab Potential Excellent    PT Frequency 1x / week    PT Duration 12 weeks    PT Treatment/Interventions ADLs/Self Care Home Management;Biofeedback;Cryotherapy;Moist Heat;Electrical Stimulation;Therapeutic exercise;Neuromuscular re-education;Patient/family education;Therapeutic activities;Manual techniques;Taping;Passive range of motion;Dry needling    PT Next Visit Plan f/u on initial HEP and progress as able    PT Home Exercise Plan Access Code: [email or web address]    Consulted and Agree with Plan of Care Patient             Patient will benefit from skilled therapeutic intervention in order to improve the following deficits and impairments:  Postural dysfunction, Decreased strength, Decreased endurance, Decreased coordination  Visit Diagnosis: Muscle weakness (generalized)  Unspecified lack of coordination     Problem List Patient Active Problem List   Diagnosis Date Noted   Aortic atherosclerosis ([place]) 04/27/2021   Left shoulder pain 02/06/2021   AC (acromioclavicular) arthritis 02/06/2021   Hematoma of left hip 12/26/2020   Degenerative arthritis of knee, bilateral 02/18/2019   Vitamin B 12  deficiency 09/22/2018   Arthralgia of both knees 11/13/2017   Trigger finger, left 08/03/2017   Solitary pulmonary nodule 01/10/2017   Snoring 06/05/2016   Excessive daytime sleepiness 06/05/2016   Shortness of breath 03/11/2016   Migraine 08/17/2014   Esophageal reflux 09/03/2010   Angioedema 06/21/2010   History of colonic polyps 09/27/2007   Hyperlipidemia 05/03/2007   Essential hypertension 05/03/2007   Hypothyroidism 04/12/2007   Osteoarthritis 04/12/2007    Camillo Flaming Tashona Calk, PT 08/29/2021, 9:31 AM  [facility] @ [address] [place], [place], [address] Phone: [phone]   Fax:  [phone]  Name: Kelly Frazier MRN: [id number] Date of Birth: 22-Dec-1945

## 2021-08-29 NOTE — Patient Instructions (Signed)
Access Code: [email or web address] URL: [email or web address] Date: 08/29/2021 Prepared by: Jari Favre  Exercises Supine Diaphragmatic Breathing - 3 x daily - 7 x weekly - 1 sets - 10 reps Supine Pelvic Floor Contraction - 2 x daily - 7 x weekly - 1 sets - 5 reps

## 2021-09-02 ENCOUNTER — Telehealth: Payer: Self-pay

## 2021-09-02 NOTE — Telephone Encounter (Signed)
VIERA OKONSKI is a 75 y.o. female LM on the VM stating Logan Bores is too expensive and pt wants to go back to oxybutynin 5 mg.  I attempted to contact the pt re: med change. Per Dr. Efrain Sella note it pt was advised against using the oxybutynin due to memory loss.  Pt was not available. LM on the VM for the patient to call me back. --- Pt was called again and read back the instruction notes from her visit that Dr. Wannetta Sender. Pt strongly advised to discontinue the oxybutynin per Dr.Schroeder's recommendation.  I asked Ms.Felix if the samples of Gemtesa 75mg  were working and the pt said she did not get samples. I told her to come pick up samples today and we will get a PA for the [facility].  4 boxes of Gemtesa given.

## 2021-09-20 ENCOUNTER — Ambulatory Visit: Payer: Medicare Other | Admitting: Obstetrics and Gynecology

## 2021-09-22 ENCOUNTER — Other Ambulatory Visit: Payer: Self-pay | Admitting: Family Medicine

## 2021-09-23 ENCOUNTER — Encounter: Payer: Medicare Other | Admitting: Physical Therapy

## 2021-09-28 DIAGNOSIS — Z20822 Contact with and (suspected) exposure to covid-19: Secondary | ICD-10-CM | POA: Diagnosis not present

## 2021-09-30 ENCOUNTER — Encounter: Payer: Medicare Other | Admitting: Physical Therapy

## 2021-10-01 NOTE — Progress Notes (Deleted)
[facility] [place] Phone: [phone] Subjective:    I'm seeing this patient by the request  of:  Marin Olp, MD  CC:   [id number]  06/19/2021 Patient has responded well to the viscosupplementation at this time.  Discussed icing regimen and home exercises.  Continue weight loss at least 6 months.  Patient will follow up again in 3 months and hopefully we will have approval for another round of gel injections if needed.  Patient of course wants to avoid surgical intervention if possible.  Updated 10/02/2021 Kelly Frazier is a 76 y.o. female coming in with complaint of bilateral knee pain       Past Medical History:  Diagnosis Date   Allergy    Arthritis    OA   Cancer ([place]) 1980's   basal cell carcinoma   Cataract    bilateral 2 yrs ago    Diverticulosis    Excessive daytime sleepiness 06/05/2016   GERD (gastroesophageal reflux disease)    Head injury due to trauma    s/p attack - some memory loss    Headache(784.0)    Hx of colonic polyps    Hyperlipidemia    no meds    Hypertension    pt denies- on  meds    Thyroid disease    Past Surgical History:  Procedure Laterality Date   AXILLARY ABCESS IRRIGATION AND DEBRIDEMENT     cyst   bcca from eyelid     basal cell 1980s.    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 1 son. 2 twin grandkids age 2 in 40- in [place].       Retired from [place].       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 handed   Drinks caffeine one story   Social Determinants of Health   Financial Resource Strain: Medium Risk   Difficulty of Paying Living Expenses: Somewhat hard  Food Insecurity: No Food Insecurity   Worried About Charity fundraiser in the Last Year: Never true   Arboriculturist in the Last Year: Never true  Transportation Needs: No Transportation Needs   Lack of Transportation (Medical): No   Lack of Transportation (Non-Medical): No  Physical Activity: Inactive   Days of Exercise per Week: 0 days   Minutes of Exercise per Session: 0 min  Stress: No Stress Concern Present   Feeling of Stress : Only a little  Social Connections: Unknown   Frequency of Communication with Friends and Family: More than three times a week   Frequency of Social Gatherings with Friends and Family: More than three times a week   Attends Religious Services: Never   Marine scientist or Organizations: Yes   Attends Archivist Meetings: 1 to 4 times per year   Marital Status: Not on file   Allergies  Allergen Reactions   Naprosyn [Naproxen] Swelling   Terbinafine Hcl Swelling    Lamisil    Family History  Problem Relation Age of Onset   Cancer Mother        lung- nonsmoker. in her 8s, perhaps related to job   Hypertension Mother    Lung cancer Mother    Liver disease Father    Alcohol abuse Father    Hypertension Father    Ovarian cancer Maternal Grandmother    Hodgkin's lymphoma Maternal Grandfather    Dementia Paternal Grandmother    Tuberculosis Paternal Grandfather    Colon cancer Maternal Aunt    Colon cancer Maternal  Uncle    Colon polyps Neg Hx    Esophageal cancer Neg Hx    Rectal cancer Neg Hx    Stomach cancer Neg Hx     Current Outpatient Medications (Endocrine & Metabolic):    levothyroxine (SYNTHROID) 75 MCG tablet, TAKE 1 TABLET BY MOUTH EVERY DAY  Current Outpatient Medications (Cardiovascular):    atorvastatin (LIPITOR) 10 MG tablet, TAKE 1 TABLET BY MOUTH EVERY DAY   EPINEPHrine (EPIPEN JR) 0.15 MG/0.3ML injection, Inject 0.15 mg into the muscle as needed.    Current Outpatient Medications (Hematological):    vitamin B-12 (CYANOCOBALAMIN) 1000 MCG tablet, Take 1,000 mcg by mouth daily.  Current Outpatient Medications (Other):    Ascorbic Acid (VITAMIN C WITH ROSE HIPS) 500 MG tablet, Take 500 mg by mouth daily.   CALCIUM-MAGNESIUM-ZINC PO, Take by mouth.   Cholecalciferol (VITAMIN D3) 10 MCG (400 UNIT) CAPS, Take 10 mcg by mouth.   clobetasol ointment (TEMOVATE) 0.05 %, Apply twice daily to vulva externally   Clobetasol Prop Emollient Base (CLOBETASOL PROPIONATE E) 0.05 % emollient cream, Apply 1 application topically at bedtime.   co-enzyme Q-10 30 MG capsule, Take 200 mg by mouth daily.   diclofenac sodium (VOLTAREN) 1 % GEL, Apply topically to affected area qid   hydrocortisone 2.5 % cream, Apply topically in the morning and at bedtime.   Multiple Vitamins-Minerals (WOMENS 50+ ADVANCED PO), Take 2 tablets by mouth daily.   omeprazole (PRILOSEC) 20 MG capsule, TAKE 1-2 CAPSULEs BY MOUTH EVERY DAY   Saccharomyces boulardii (PROBIOTIC) 250 MG CAPS, Take by mouth.   Vibegron 75 MG TABS, Take 1 tablet by mouth daily.   Reviewed prior external information including notes and imaging from  primary care provider As well as notes that were available from care everywhere and other healthcare systems.  Past medical history, social, surgical and family history all reviewed in electronic medical record.  No pertanent information unless stated regarding to the chief complaint.   Review of  Systems:  No headache, visual changes, nausea, vomiting, diarrhea, constipation, dizziness, abdominal pain, skin rash, fevers, chills, night sweats, weight loss, swollen lymph nodes, body aches, joint swelling, chest pain, shortness of breath, mood changes. POSITIVE muscle aches  Objective  There were no vitals taken for this visit.   General: No apparent distress alert  and oriented x3 mood and affect normal, dressed appropriately.  HEENT: Pupils equal, extraocular movements intact  Respiratory: Patient's speak in full sentences and does not appear short of breath  Cardiovascular: No lower extremity edema, non tender, no erythema  Gait normal with good balance and coordination.  MSK:  Non tender with full range of motion and good stability and symmetric strength and tone of shoulders, elbows, wrist, hip, knee and ankles bilaterally.     Impression and Recommendations:     The above documentation has been reviewed and is accurate and complete Belva Agee

## 2021-10-02 ENCOUNTER — Ambulatory Visit: Payer: Medicare Other | Admitting: Family Medicine

## 2021-10-07 ENCOUNTER — Encounter: Payer: Medicare Other | Admitting: Physical Therapy

## 2021-10-14 ENCOUNTER — Other Ambulatory Visit: Payer: Self-pay

## 2021-10-14 ENCOUNTER — Ambulatory Visit: Payer: Medicare Other | Attending: Family Medicine | Admitting: Physical Therapy

## 2021-10-14 ENCOUNTER — Encounter: Payer: Self-pay | Admitting: Physical Therapy

## 2021-10-14 DIAGNOSIS — M6281 Muscle weakness (generalized): Secondary | ICD-10-CM | POA: Diagnosis not present

## 2021-10-14 DIAGNOSIS — R279 Unspecified lack of coordination: Secondary | ICD-10-CM

## 2021-10-14 NOTE — Patient Instructions (Signed)
Access Code: [email or web address] URL: [email or web address] Date: 10/14/2021 Prepared by: Jari Favre  Exercises Supine Diaphragmatic Breathing - 3 x daily - 7 x weekly - 1 sets - 10 reps Supine Pelvic Floor Contraction - 2 x daily - 7 x weekly - 1 sets - 5 reps Supine Bridge with Pelvic Floor Contraction and Hip Rotation - 2 x daily - 7 x weekly - 10 reps - 1 sets - 5 sec hold Standing Low Back Flexion at Table - 3 x daily - 7 x weekly - 1 sets - 8 reps

## 2021-10-14 NOTE — Therapy (Signed)
[facility] @ [address] [place], [place], [address] Phone: [phone]   Fax:  [phone]  Physical Therapy Treatment  Patient Details  Name: Kelly Frazier MRN: [id number] Date of Birth: 11/15/1945 Referring Provider (PT): Jaquita Folds, MD   Encounter Date: 10/14/2021   PT End of Session - 10/14/21 1238     Visit Number 2    Date for PT Re-Evaluation 11/20/21    Authorization Type medicare A/B    PT Start Time 1232    PT Stop Time 1312    PT Time Calculation (min) 40 min    Activity Tolerance Patient tolerated treatment well    Behavior During Therapy [facility] for tasks assessed/performed             Past Medical History:  Diagnosis Date   Allergy    Arthritis    OA   Cancer ([place]) 1980's   basal cell carcinoma   Cataract    bilateral 2 yrs ago    Diverticulosis    Excessive daytime sleepiness 06/05/2016   GERD (gastroesophageal reflux disease)    Head injury due to trauma    s/p attack - some memory loss    Headache(784.0)    Hx of colonic polyps    Hyperlipidemia    no meds    Hypertension    pt denies- on  meds    Thyroid disease     Past Surgical History:  Procedure Laterality Date   AXILLARY ABCESS IRRIGATION AND DEBRIDEMENT     cyst   bcca from eyelid     basal cell 1980s.    BREAST BIOPSY  2006   and 2017   CATARACT EXTRACTION, BILATERAL     COLONOSCOPY  08/17/06   NSVD  1971   x1   osseous implant     POLYPECTOMY     removal of moles and denmatoid      There were no vitals filed for this visit.   Subjective Assessment - 10/14/21 1237     Subjective Taking a new medication and no having as much leakage or urgency at night with that.    Patient Stated Goals stop having leakage    Currently in Pain? No/denies                               [facility] Adult PT Treatment/Exercise - 10/14/21 0001       Self-Care   Self-Care Other Self-Care Comments     Other Self-Care Comments  urge technique      Neuro Re-ed    Neuro Re-ed Details  educated on kegel and relaxing with breathing for reduced urge      Exercises   Exercises Lumbar      Lumbar Exercises: Standing   Other Standing Lumbar Exercises leaning on mat 3 way stance - 10 x each      Lumbar Exercises: Supine   Bridge 10 reps    Large Ball Oblique Isometric Limitations overhead and presses - 10x each    Other Supine Lumbar Exercises bent knee fall out                     PT Education - 10/14/21 1313     Education Details Access Code: [email or web address]    Person(s) Educated Patient    Methods Explanation;Demonstration;Tactile cues;Verbal cues;Handout    Comprehension Verbalized understanding;Returned demonstration  PT Long Term Goals - 10/14/21 1448       PT LONG TERM GOAL #1   Title Independent with advanced HEP as indicated    Status On-going      PT LONG TERM GOAL #2   Title Pt will demonstrate 5x sit to stand of <12 seconds    Status On-going      PT LONG TERM GOAL #3   Title Pt will report at least 75% less urgency due to improved pelvic floor strength and coordination    Status On-going      PT LONG TERM GOAL #4   Title Pt will report at least 60% less leakage    Status On-going      PT LONG TERM GOAL #5   Title Pt will be able to drive 2-3 hours without leakage due to improved muscle strength    Status On-going                   Plan - 10/14/21 1303     Clinical Impression Statement Pt did not have a chance to do the exercises due to schedule issues.  Pt did well with exercises today and was doing exercises correctly.  Pt needs some cues to breathe correctly and to engage the pelvic floor with exertion during each exercise.  Pt is doing a good job of feeling the muscles contract but was not giving enough time to relax.  Pt will benefit from skilled PT to continue working on functional strength and muscle coordination to  work towards not leaking during all activities of daily living.    PT Treatment/Interventions ADLs/Self Care Home Management;Biofeedback;Cryotherapy;Moist Heat;Electrical Stimulation;Therapeutic exercise;Neuromuscular re-education;Patient/family education;Therapeutic activities;Manual techniques;Taping;Passive range of motion;Dry needling    PT Next Visit Plan f/u on urge and porgression of exercises; staggered stance and band exercises and thoracic extension    PT Home Exercise Plan Access Code: [email or web address]    Consulted and Agree with Plan of Care Patient             Patient will benefit from skilled therapeutic intervention in order to improve the following deficits and impairments:  Postural dysfunction, Decreased strength, Decreased endurance, Decreased coordination  Visit Diagnosis: Muscle weakness (generalized)  Unspecified lack of coordination     Problem List Patient Active Problem List   Diagnosis Date Noted   Aortic atherosclerosis ([place]) 04/27/2021   Left shoulder pain 02/06/2021   AC (acromioclavicular) arthritis 02/06/2021   Hematoma of left hip 12/26/2020   Degenerative arthritis of knee, bilateral 02/18/2019   Vitamin B 12 deficiency 09/22/2018   Arthralgia of both knees 11/13/2017   Trigger finger, left 08/03/2017   Solitary pulmonary nodule 01/10/2017   Snoring 06/05/2016   Excessive daytime sleepiness 06/05/2016   Shortness of breath 03/11/2016   Migraine 08/17/2014   Esophageal reflux 09/03/2010   Angioedema 06/21/2010   History of colonic polyps 09/27/2007   Hyperlipidemia 05/03/2007   Essential hypertension 05/03/2007   Hypothyroidism 04/12/2007   Osteoarthritis 04/12/2007    Camillo Flaming Jenette Rayson, PT 10/14/2021, 2:51 PM  [facility] @ [address] [place], [place], [address] Phone: [phone]   Fax:  [phone]  Name: JAILYNE CHIEFFO MRN: [id number] Date of Birth: 1945-11-16

## 2021-10-18 ENCOUNTER — Ambulatory Visit (INDEPENDENT_AMBULATORY_CARE_PROVIDER_SITE_OTHER): Payer: Medicare Other | Admitting: Family Medicine

## 2021-10-18 ENCOUNTER — Other Ambulatory Visit: Payer: Self-pay

## 2021-10-18 ENCOUNTER — Encounter: Payer: Self-pay | Admitting: Family Medicine

## 2021-10-18 VITALS — BP 130/90 | HR 76 | Temp 98.0°F | Ht 62.0 in | Wt 178.0 lb

## 2021-10-18 DIAGNOSIS — I700 Atherosclerosis of aorta: Secondary | ICD-10-CM

## 2021-10-18 DIAGNOSIS — I10 Essential (primary) hypertension: Secondary | ICD-10-CM

## 2021-10-18 DIAGNOSIS — E538 Deficiency of other specified B group vitamins: Secondary | ICD-10-CM

## 2021-10-18 DIAGNOSIS — E039 Hypothyroidism, unspecified: Secondary | ICD-10-CM | POA: Diagnosis not present

## 2021-10-18 DIAGNOSIS — E785 Hyperlipidemia, unspecified: Secondary | ICD-10-CM | POA: Diagnosis not present

## 2021-10-18 LAB — CBC WITH DIFFERENTIAL/PLATELET
Basophils Absolute: 0.1 10*3/uL (ref 0.0–0.1)
Basophils Relative: 0.7 % (ref 0.0–3.0)
Eosinophils Absolute: 0.2 10*3/uL (ref 0.0–0.7)
Eosinophils Relative: 2.4 % (ref 0.0–5.0)
HCT: 42.1 % (ref 36.0–46.0)
Hemoglobin: 14.1 g/dL (ref 12.0–15.0)
Lymphocytes Relative: 28.4 % (ref 12.0–46.0)
Lymphs Abs: 2.4 10*3/uL (ref 0.7–4.0)
MCHC: 33.4 g/dL (ref 30.0–36.0)
MCV: 89.3 fl (ref 78.0–100.0)
Monocytes Absolute: 0.5 10*3/uL (ref 0.1–1.0)
Monocytes Relative: 6.2 % (ref 3.0–12.0)
Neutro Abs: 5.3 10*3/uL (ref 1.4–7.7)
Neutrophils Relative %: 62.3 % (ref 43.0–77.0)
Platelets: 271 10*3/uL (ref 150.0–400.0)
RBC: 4.72 Mil/uL (ref 3.87–5.11)
RDW: 15 % (ref 11.5–15.5)
WBC: 8.5 10*3/uL (ref 4.0–10.5)

## 2021-10-18 LAB — VITAMIN B12: Vitamin B-12: 858 pg/mL (ref 211–911)

## 2021-10-18 LAB — COMPREHENSIVE METABOLIC PANEL
ALT: 19 U/L (ref 0–35)
AST: 21 U/L (ref 0–37)
Albumin: 4.2 g/dL (ref 3.5–5.2)
Alkaline Phosphatase: 74 U/L (ref 39–117)
BUN: 6 mg/dL (ref 6–23)
CO2: 30 mEq/L (ref 19–32)
Calcium: 9.4 mg/dL (ref 8.4–10.5)
Chloride: 103 mEq/L (ref 96–112)
Creatinine, Ser: 0.64 mg/dL (ref 0.40–1.20)
GFR: 87.33 mL/min (ref >60.00)
Glucose, Bld: 102 mg/dL — ABNORMAL HIGH (ref 70–99)
Potassium: 4 mEq/L (ref 3.5–5.1)
Sodium: 140 mEq/L (ref 135–145)
Total Bilirubin: 0.6 mg/dL (ref 0.2–1.2)
Total Protein: 6.6 g/dL (ref 6.0–8.3)

## 2021-10-18 LAB — LDL CHOLESTEROL, DIRECT: Direct LDL: 88 mg/dL

## 2021-10-18 LAB — TSH: TSH: 1.76 u[IU]/mL (ref 0.35–5.50)

## 2021-10-18 NOTE — Patient Instructions (Addendum)
Please monitor your blood pressure readings at home daily for the next 2 weeks then update [place]. Blood pressure goal should average <135/85.   Please stop by lab before you go If you have mychart- we will send your results within 3 business days of [place] receiving them.  If you do not have mychart- we will call you about results within 5 business days of [place] receiving them.  *please also note that you will see labs on mychart as soon as they post. I will later go in and write notes on them- will say "notes from Dr. Yong Channel"  Recommended follow up: Return in about 6 months (around 04/17/2022) for follow up- or sooner if needed.  Please have a wonderful Birthday!

## 2021-10-18 NOTE — Progress Notes (Signed)
Phone [phone] In person visit   Subjective:   Kelly Frazier is a 76 y.o. year old very pleasant female patient who presents for/with See problem oriented charting Chief Complaint  Patient presents with   Follow-up   Hyperlipidemia   Hypothyroidism   Hypertension   Urinary Incontinence    Pt states she has been taking oxybutin for this by her GYN and was told that no one over 40 should be taking it due to memory problems. She has noticed memory issues.    This visit occurred during the SARS-CoV-2 public health emergency.  Safety protocols were in place, including screening questions prior to the visit, additional usage of staff PPE, and extensive cleaning of exam room while observing appropriate contact time as indicated for disinfecting solutions.   Past Medical History-  Patient Active Problem List   Diagnosis Date Noted   Aortic atherosclerosis ([place]) 04/27/2021    Priority: Medium    Vitamin B 12 deficiency 09/22/2018    Priority: Medium    Solitary pulmonary nodule 01/10/2017    Priority: Medium    Migraine 08/17/2014    Priority: Medium    Hyperlipidemia 05/03/2007    Priority: Medium    Essential hypertension 05/03/2007    Priority: Medium    Hypothyroidism 04/12/2007    Priority: Medium    Degenerative arthritis of knee, bilateral 02/18/2019    Priority: Low   Arthralgia of both knees 11/13/2017    Priority: Low   Trigger finger, left 08/03/2017    Priority: Low   Esophageal reflux 09/03/2010    Priority: Low   Angioedema 06/21/2010    Priority: Low   History of colonic polyps 09/27/2007    Priority: Low   Osteoarthritis 04/12/2007    Priority: Low   Left shoulder pain 02/06/2021   AC (acromioclavicular) arthritis 02/06/2021   Hematoma of left hip 12/26/2020   Snoring 06/05/2016   Excessive daytime sleepiness 06/05/2016   Shortness of breath 03/11/2016    Medications- reviewed and updated Current Outpatient Medications  Medication Sig  Dispense Refill   Ascorbic Acid (VITAMIN C WITH ROSE HIPS) 500 MG tablet Take 500 mg by mouth daily.     atorvastatin (LIPITOR) 10 MG tablet TAKE 1 TABLET BY MOUTH EVERY DAY 90 tablet 2   CALCIUM-MAGNESIUM-ZINC PO Take by mouth.     Cholecalciferol (VITAMIN D3) 10 MCG (400 UNIT) CAPS Take 10 mcg by mouth.     Clobetasol Prop Emollient Base (CLOBETASOL PROPIONATE E) 0.05 % emollient cream Apply 1 application topically at bedtime. 30 g 11   co-enzyme Q-10 30 MG capsule Take 200 mg by mouth daily.     diclofenac sodium (VOLTAREN) 1 % GEL Apply topically to affected area qid 100 g 1   EPINEPHrine (EPIPEN JR) 0.15 MG/0.3ML injection Inject 0.15 mg into the muscle as needed.     hydrocortisone 2.5 % cream Apply topically in the morning and at bedtime.     levothyroxine (SYNTHROID) 75 MCG tablet TAKE 1 TABLET BY MOUTH EVERY DAY 90 tablet 2   Multiple Vitamins-Minerals (WOMENS 50+ ADVANCED PO) Take 2 tablets by mouth daily.     omeprazole (PRILOSEC) 20 MG capsule TAKE 1-2 CAPSULEs BY MOUTH EVERY DAY 180 capsule 3   Saccharomyces boulardii (PROBIOTIC) 250 MG CAPS Take by mouth.     Vibegron 75 MG TABS Take 1 tablet by mouth daily. 30 tablet 5   vitamin B-12 (CYANOCOBALAMIN) 1000 MCG tablet Take 1,000 mcg by mouth daily.  clobetasol ointment (TEMOVATE) 0.05 % Apply twice daily to vulva externally     No current facility-administered medications for this visit.     Objective:  BP 130/90    Pulse 76    Temp 98 F (36.7 C)    Ht 5\' 2"  (1.575 m)    Wt 178 lb (80.7 kg)    SpO2 97%    BMI 32.56 kg/m  Gen: NAD, resting comfortably CV: RRR no murmurs rubs or gallops Lungs: CTAB no crackles, wheeze, rhonchi Abdomen: soft/nontender/nondistended/normal bowel sounds. No rebound or guarding.  Ext: no edema Skin: warm, dry     Assessment and Plan   #Viral illness in early January- recovering from this- feels much better.   #hyperlipidemia/aortic atherosclerosis-LDL goal under 70 S: Medication:  Atorvastatin 10 mg every day -Weight up 5 pounds from last in office reading. Also didn't eat best over holidays and may have contributed Lab Results  Component Value Date   CHOL 134 01/04/2021   HDL 43 (L) 01/04/2021   LDLCALC 74 01/04/2021   LDLDIRECT 73.0 03/01/2018   TRIG 91 01/04/2021   CHOLHDL 3.1 01/04/2021   A/P: update direct LDL today- ideal goal 70 or less with aortic atherosclerosis- for now continue current meds unless LDL gets above 100 likely- otherwise work on lifestyle - some slip ups on diet over holidays.    #hypothyroidism S: compliant On thyroid medication- Synthroid 75Mcg every day   Lab Results  Component Value Date   TSH 1.14 01/04/2021   A/P:  hopefully stable- update TSH today. Continue current meds for now   #hypertension S: medication: none- Ziac 2.5-6.25Mg  in the past  Home readings #s: has cuffs but not been checking BP Readings from Last 3 Encounters:  10/18/21 130/90  07/30/21 (!) 148/102  06/25/21 (!) 154/82  A/P: mild elevation today- last time in office was controlled- she will do home monitoring for 2 weeks and update [place]- may need to tweak meds  # B12 deficiency S: Current treatment/medication (oral vs. IM): Vitamin b12 1000Mcg A/P: hopefully stable- update b12 today. Continue current meds for now   #Overactive bladder/ memory loss-on oxybutynin in the past but may have been contributing to memory issues-now on Gemtesa-feels more mentally clear! She has cut down on her lists and feels much better overall -doing pelvic floor therapy- [facility]  #Meningioma-patient being followed by [facility] with last visit on 07/25/2021 with plan for repeat MRI in 6 months   Recommended follow up: No follow-ups on file. Future Appointments  Date Time Provider [place]  10/25/2021  1:40 PM Jaquita Folds, MD [facility]  10/28/2021  2:15 PM Lyndal Pulley, DO [facility] None  04/21/2022  3:15 PM [facility] HEALTH COACH [facility]    Lab/Order associations:   ICD-10-CM   1. Essential hypertension  I10     2. Hyperlipidemia, unspecified hyperlipidemia type  E78.5 Comprehensive metabolic panel    CBC with Differential/Platelet    LDL cholesterol, direct    3. Hypothyroidism, unspecified type  E03.9 TSH    4. Aortic atherosclerosis (HCC)  I70.0     5. B12 deficiency  E53.8 Vitamin B12     No orders of the defined types were placed in this encounter.  I,Jada Bradford,acting as a scribe for Garret Reddish, MD.,have documented all relevant documentation on the behalf of Garret Reddish, MD,as directed by  Garret Reddish, MD while in the presence of Garret Reddish, MD.  I, Garret Reddish, MD, have reviewed all  documentation for this visit. The documentation on 10/18/21 for the exam, diagnosis, procedures, and orders are all accurate and complete.  Return precautions advised.  Garret Reddish, MD

## 2021-10-21 ENCOUNTER — Telehealth: Payer: Self-pay

## 2021-10-21 NOTE — Telephone Encounter (Signed)
Patient states she was taken off of bifoprolol hctz 2-5.6 25mg  back in the spring.  States at her last OV she believes Dr. Yong Channel did not realize he had taken her off of this med.    States her BP has gone up.  Would like to know if Dr. Yong Channel could send a script to [facility] [address] in [facility].

## 2021-10-22 ENCOUNTER — Other Ambulatory Visit: Payer: Self-pay

## 2021-10-22 MED ORDER — BISOPROLOL-HYDROCHLOROTHIAZIDE 2.5-6.25 MG PO TABS: 1.0000 | ORAL_TABLET | Freq: Every day | ORAL | 3 refills | Status: DC

## 2021-10-22 NOTE — Telephone Encounter (Signed)
We discussed that she was off of medications "#hypertension S: medication: none- Ziac 2.5-6.25Mg  in the past  Home readings #s: has cuffs but not been checking    BP Readings from Last 3 Encounters:  10/18/21 130/90  07/30/21 (!) 148/102  06/25/21 (!) 154/82  A/P: mild elevation today- last time in office was controlled- she will do home monitoring for 2 weeks and update [place]- may need to tweak meds"  It sounds like her blood pressure is remaining high at home-please restart her prior Ziac 2.5-6.25 mg #90 with 3 refills for daily use and have her schedule 1 to 75-month follow-up with me to recheck blood pressure  Also happy early birthday!

## 2021-10-22 NOTE — Telephone Encounter (Signed)
Called and made pt aware that medication sent to pharmacy, pt will cb to schedule appointment.

## 2021-10-22 NOTE — Telephone Encounter (Signed)
See below

## 2021-10-25 ENCOUNTER — Other Ambulatory Visit: Payer: Self-pay

## 2021-10-25 ENCOUNTER — Ambulatory Visit (INDEPENDENT_AMBULATORY_CARE_PROVIDER_SITE_OTHER): Payer: Medicare Other | Admitting: Obstetrics and Gynecology

## 2021-10-25 ENCOUNTER — Encounter: Payer: Self-pay | Admitting: Obstetrics and Gynecology

## 2021-10-25 VITALS — BP 124/81 | HR 69

## 2021-10-25 DIAGNOSIS — N3281 Overactive bladder: Secondary | ICD-10-CM | POA: Diagnosis not present
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
## 2021-10-28 ENCOUNTER — Ambulatory Visit (INDEPENDENT_AMBULATORY_CARE_PROVIDER_SITE_OTHER): Payer: Medicare Other

## 2021-10-28 ENCOUNTER — Other Ambulatory Visit: Payer: Self-pay

## 2021-10-28 ENCOUNTER — Ambulatory Visit (INDEPENDENT_AMBULATORY_CARE_PROVIDER_SITE_OTHER): Payer: Medicare Other | Admitting: Family Medicine

## 2021-10-28 VITALS — BP 122/72 | HR 69 | Ht 62.0 in | Wt 176.0 lb

## 2021-10-28 DIAGNOSIS — M79641 Pain in right hand: Secondary | ICD-10-CM

## 2021-10-28 DIAGNOSIS — M180 Bilateral primary osteoarthritis of first carpometacarpal joints: Secondary | ICD-10-CM

## 2021-10-28 DIAGNOSIS — M170 Bilateral primary osteoarthritis of knee: Secondary | ICD-10-CM

## 2021-10-28 DIAGNOSIS — M79642 Pain in left hand: Secondary | ICD-10-CM | POA: Diagnosis not present

## 2021-10-28 NOTE — Assessment & Plan Note (Signed)
Patient continues to respond extremely well to the viscosupplementation.  Has been 6 months since we have done it previously.  Hoping that this will continue to give her benefit.  Discussed the importance of the exercises, weight loss, home exercises.  Follow-up with me again 6 to 8 weeks.

## 2021-10-28 NOTE — Assessment & Plan Note (Addendum)
Right greater than left.  Discussed which activities to do and which ones to avoid.  Increase activity slowly.  Discussed home exercises.  Follow-up again in 6 to 8 weeks may need bracing or injections at follow-up

## 2021-10-28 NOTE — Patient Instructions (Addendum)
Xray today Glove liners at night Gel injections today See me again in 6 weeks

## 2021-11-14 ENCOUNTER — Encounter: Payer: Medicare Other | Admitting: Physical Therapy

## 2021-11-21 ENCOUNTER — Encounter: Payer: Medicare Other | Admitting: Physical Therapy

## 2021-11-26 DIAGNOSIS — Z20822 Contact with and (suspected) exposure to covid-19: Secondary | ICD-10-CM | POA: Diagnosis not present

## 2021-11-27 ENCOUNTER — Other Ambulatory Visit: Payer: Self-pay | Admitting: Family Medicine

## 2021-11-28 ENCOUNTER — Other Ambulatory Visit: Payer: Self-pay

## 2021-11-28 ENCOUNTER — Ambulatory Visit: Payer: Medicare Other | Attending: Family Medicine | Admitting: Physical Therapy

## 2021-11-28 DIAGNOSIS — M6281 Muscle weakness (generalized): Secondary | ICD-10-CM | POA: Diagnosis not present

## 2021-11-28 DIAGNOSIS — R279 Unspecified lack of coordination: Secondary | ICD-10-CM | POA: Insufficient documentation

## 2021-11-28 NOTE — Patient Instructions (Signed)
Access Code: [email or web address] ?URL: [email or web address] ?Date: 11/28/2021 ?Prepared by: Jari Favre ? ?Exercises ?Supine Diaphragmatic Breathing - 3 x daily - 7 x weekly - 1 sets - 10 reps ?Supine Pelvic Floor Contraction - 2 x daily - 7 x weekly - 1 sets - 5 reps ?Supine Bridge with Pelvic Floor Contraction and Hip Rotation - 2 x daily - 7 x weekly - 1 sets - 10 reps - 5 sec hold ?Standing Low Back Flexion at Table - 3 x daily - 7 x weekly - 1 sets - 8 reps ?Bent Knee Fallouts with Alternating Legs - 1 x daily - 7 x weekly - 3 sets - 10 reps ?Seated Diaphragmatic Breathing - 3 x daily - 7 x weekly - 1 sets - 10 reps ?Seated Thoracic Flexion and Rotation with Arms Crossed - 1 x daily - 7 x weekly - 1 sets - 10 reps - 5 sec hold ? ?

## 2021-11-28 NOTE — Therapy (Signed)
[place] [facility] ?[place], [place], [address] ?Phone: [phone]   Fax:  [phone] ? ?Physical Therapy Treatment ? ?Patient Details  ?Name: Kelly Frazier ?MRN: [id number] ?Date of Birth: 03/27/1946 ?Referring Provider (PT): Jaquita Folds, MD ? ? ?Encounter Date: 11/28/2021 ? ? PT End of Session - 11/28/21 1242   ? ? Visit Number 3   ? Date for PT Re-Evaluation 11/28/21   ? Authorization Type medicare A/B   ? PT Start Time 1146   ? PT Stop Time 1226   ? PT Time Calculation (min) 40 min   ? Activity Tolerance Patient tolerated treatment well   ? Behavior During Therapy [facility] for tasks assessed/performed   ? ?  ?  ? ?  ? ? ?Past Medical History:  ?Diagnosis Date  ? Allergy   ? Arthritis   ? OA  ? Cancer ([place]) 1980's  ? basal cell carcinoma  ? Cataract   ? bilateral 2 yrs ago   ? Diverticulosis   ? Excessive daytime sleepiness 06/05/2016  ? GERD (gastroesophageal reflux disease)   ? Head injury due to trauma   ? s/p attack - some memory loss   ? Headache(784.0)   ? Hx of colonic polyps   ? Hyperlipidemia   ? no meds   ? Hypertension   ? pt denies- on  meds   ? Thyroid disease   ? ? ?Past Surgical History:  ?Procedure Laterality Date  ? AXILLARY ABCESS IRRIGATION AND DEBRIDEMENT    ? cyst  ? bcca from eyelid    ? basal cell 1980s.   ? BREAST BIOPSY  2006  ? and 2017  ? CATARACT EXTRACTION, BILATERAL    ? COLONOSCOPY  08/17/06  ? NSVD  1971  ? x1  ? osseous implant    ? POLYPECTOMY    ? removal of moles and denmatoid    ? ? ?There were no vitals filed for this visit. ? ? Subjective Assessment - 11/28/21 1150   ? ? Subjective I am thinking I will get the subdural implant.  Have not had time to work on exercises but I would like to get whatever I can do at home for now.   ? Patient Stated Goals stop having leakage   ? Currently in Pain? No/denies   ? ?  ?  ? ?  ? ? ? ? ? ? ? ? ? ? ? ? ? ? ? ? ? ? ? ? [facility] Adult PT Treatment/Exercise - 11/28/21 0001    ? ?  ? Self-Care  ? Other Self-Care Comments  educated in stim unit and kegel exercises with kegel weights   ?  ? Lumbar Exercises: Stretches  ? Other Lumbar Stretch Exercise thoracic rotation - seated 3 x side to side   ?  ? Lumbar Exercises: Supine  ? AB Set Limitations kegel with knees apart; feet apart - 20x each   ? Bridge 10 reps   ? Other Supine Lumbar Exercises bent knee fall out   ? ?  ?  ? ?  ? ? ? ? ? ? ? ? ? ? PT Education - 11/28/21 1236   ? ? Education Details updated medbridge and reviewed HEP   ? Person(s) Educated Patient   ? Methods Explanation;Demonstration;Tactile cues;Verbal cues;Handout   ? Comprehension Verbalized understanding;Returned demonstration   ? ?  ?  ? ?  ? ? ? ? ? ? PT Long  Term Goals - 11/28/21 1148   ? ?  ? PT LONG TERM GOAL #1  ? Title Independent with advanced HEP as indicated   ? Status Achieved   ?  ? PT LONG TERM GOAL #2  ? Title Pt will demonstrate 5x sit to stand of <12 seconds   ? Baseline 14 seconds   ? Status Not Met   ?  ? PT LONG TERM GOAL #3  ? Title Pt will report at least 75% less urgency due to improved pelvic floor strength and coordination   ? Baseline it is the same   ? Status Not Met   ?  ? PT LONG TERM GOAL #4  ? Title Pt will report at least 60% less leakage   ? Status Not Met   ?  ? PT LONG TERM GOAL #5  ? Title Pt will be able to drive 2-3 hours without leakage due to improved muscle strength   ? Status Not Met   ? ?  ?  ? ?  ? ? ? ? ? ? ? ? Plan - 11/28/21 1237   ? ? Clinical Impression Statement Pt is busy with a lot right now and has not had time to work on PT . Pt is going to try alternate routes but wanted to understand what she can do at home.  Pt will benefit from being d/c with HEP today.   ? PT Treatment/Interventions ADLs/Self Care Home Management;Biofeedback;Cryotherapy;Moist Heat;Electrical Stimulation;Therapeutic exercise;Neuromuscular re-education;Patient/family education;Therapeutic activities;Manual techniques;Taping;Passive range of  motion;Dry needling   ? PT Next Visit Plan d/c   ? PT Home Exercise Plan Access Code: [email or web address]   ? Consulted and Agree with Plan of Care Patient   ? ?  ?  ? ?  ? ? ?Patient will benefit from skilled therapeutic intervention in order to improve the following deficits and impairments:  Postural dysfunction, Decreased strength, Decreased endurance, Decreased coordination ? ?Visit Diagnosis: ?No diagnosis found. ? ? ? ? ?Problem List ?Patient Active Problem List  ? Diagnosis Date Noted  ? Arthritis of carpometacarpal [facility]) joint of both thumbs 10/28/2021  ? Aortic atherosclerosis ([place]) 04/27/2021  ? Left shoulder pain 02/06/2021  ? AC (acromioclavicular) arthritis 02/06/2021  ? Hematoma of left hip 12/26/2020  ? Degenerative arthritis of knee, bilateral 02/18/2019  ? Vitamin B 12 deficiency 09/22/2018  ? Arthralgia of both knees 11/13/2017  ? Trigger finger, left 08/03/2017  ? Solitary pulmonary nodule 01/10/2017  ? Snoring 06/05/2016  ? Excessive daytime sleepiness 06/05/2016  ? Shortness of breath 03/11/2016  ? Migraine 08/17/2014  ? Esophageal reflux 09/03/2010  ? Angioedema 06/21/2010  ? History of colonic polyps 09/27/2007  ? Hyperlipidemia 05/03/2007  ? Essential hypertension 05/03/2007  ? Hypothyroidism 04/12/2007  ? Osteoarthritis 04/12/2007  ? ? ?Jule Ser, PT ?11/28/2021, 12:42 PM ? ?[place] [facility] ?[place], [place], [address] ?Phone: [phone]   Fax:  [phone] ? ?Name: Kelly Frazier ?MRN: [id number] ?Date of Birth: 1946/06/29 ? ? ?PHYSICAL THERAPY DISCHARGE SUMMARY ? ?Visits from Start of Care: 3 ? ?Current functional level related to goals / functional outcomes: ?see above goals ?  ?Remaining deficits: ?See above details ?  ?Education / Equipment: ?HEP  ? ?Patient agrees to discharge. Patient goals were met. Patient is being discharged due to meeting the stated rehab goals. ? ?

## 2021-12-05 ENCOUNTER — Encounter: Payer: Medicare Other | Admitting: Physical Therapy

## 2021-12-06 NOTE — Progress Notes (Signed)
?Kelly Frazier D.O. ?[facility] ?[place] ?Phone: [phone] ?Subjective:   ?I, Kelly Frazier, am serving as a Education administrator for Dr. Hulan Saas. ?This visit occurred during the SARS-CoV-2 public health emergency.  Safety protocols were in place, including screening questions prior to the visit, additional usage of staff PPE, and extensive cleaning of exam room while observing appropriate contact time as indicated for disinfecting solutions.  ? ?I'm seeing this patient by the request  of:  Marin Olp, MD ? ?CC: knee pain  ? ?[id number]  ?10/28/2021 ?Right greater than left.  Discussed which activities to do and which ones to avoid.  Increase activity slowly.  Discussed home exercises.  Follow-up again in 6 to 8 weeks may need bracing or injections at follow-up ? ?Patient continues to respond extremely well to the viscosupplementation.  Has been 6 months since we have done it previously.  Hoping that this will continue to give her benefit.  Discussed the importance of the exercises, weight loss, home exercises.  Follow-up with me again 6 to 8 weeks. ? ?Updated 12/09/2021 ?Kelly Frazier is a 76 y.o. female coming in with complaint of knee and bilateral thumb pain. Been having pain on lateral side of left patella and under patella of the right knee. This pain is sporadic. No other complaints. ? ? ? ?  ? ?Past Medical History:  ?Diagnosis Date  ? Allergy   ? Arthritis   ? OA  ? Cancer ([place]) 1980's  ? basal cell carcinoma  ? Cataract   ? bilateral 2 yrs ago   ? Diverticulosis   ? Excessive daytime sleepiness 06/05/2016  ? GERD (gastroesophageal reflux disease)   ? Head injury due to trauma   ? s/p attack - some memory loss   ? Headache(784.0)   ? Hx of colonic polyps   ? Hyperlipidemia   ? no meds   ? Hypertension   ? pt denies- on  meds   ? Thyroid disease   ? ?Past Surgical History:  ?Procedure Laterality Date  ? AXILLARY ABCESS IRRIGATION AND DEBRIDEMENT    ? cyst  ? bcca  from eyelid    ? basal cell 1980s.   ? BREAST BIOPSY  2006  ? and 2017  ? CATARACT EXTRACTION, BILATERAL    ? COLONOSCOPY  08/17/06  ? NSVD  1971  ? x1  ? osseous implant    ? POLYPECTOMY    ? removal of moles and denmatoid    ? ?Social History  ? ?Socioeconomic History  ? Marital status: Single  ?  Spouse name: Not on file  ? Number of children: Not on file  ? Years of education: Not on file  ? Highest education level: Not on file  ?Occupational History  ?  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 Smoking status: Never  ? Smokeless tobacco: Never  ?Vaping Use  ? Vaping Use: Never used  ?Substance and Sexual Activity  ? Alcohol use: Yes  ?  Alcohol/week: 2.0 standard drinks  ?  Types: 2 Glasses of wine per week  ? Drug use: No  ? Sexual activity: Not Currently  ?Other Topics Concern  ? Not on file  ?Social History Narrative  ? Divorced in 45s. 1 son. 2 twin grandkids age 41 in 30- in [place].   ?   ? Retired from [place].   ?   ? 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   ? Left handed  ? Drinks caffeine one story  ? ?Social Determinants of Health  ? ?Financial Resource Strain: Medium Risk  ? Difficulty of Paying Living Expenses: Somewhat hard  ?Food Insecurity: No Food Insecurity  ? Worried About Charity fundraiser in the Last Year: Never true  ? Ran Out of Food in the Last Year: Never true  ?Transportation Needs: No Transportation Needs  ? Lack of Transportation (Medical): No  ? Lack of Transportation (Non-Medical): No  ?Physical Activity: Inactive  ? Days of Exercise per Week: 0 days  ? Minutes of Exercise per Session: 0 min  ?Stress: No Stress Concern Present  ? Feeling of Stress : Only a little  ?Social Connections: Unknown  ? Frequency of Communication with Friends and Family: More than three times a week  ? Frequency of Social Gatherings with Friends and Family: More than three times a week  ? Attends Religious Services: Never  ? Active Member of Clubs or Organizations:  Yes  ? Attends Archivist Meetings: 1 to 4 times per year  ? Marital Status: Not on file  ? ?Allergies  ?Allergen Reactions  ? Naprosyn [Naproxen] Swelling  ? Terbinafine Hcl Swelling  ?  Lamisil   ? ?Family History  ?Problem Relation Age of Onset  ? Cancer Mother   ?     lung- nonsmoker. in her 65s, perhaps related to job  ? Hypertension Mother   ? Lung cancer Mother   ? Liver disease Father   ? Alcohol abuse Father   ? Hypertension Father   ? Ovarian cancer Maternal Grandmother   ? Hodgkin's lymphoma Maternal Grandfather   ? Dementia Paternal Grandmother   ? Tuberculosis Paternal Grandfather   ? Colon cancer Maternal Aunt   ? Colon cancer Maternal Uncle   ? Colon polyps Neg Hx   ? Esophageal cancer Neg Hx   ? Rectal cancer Neg Hx   ? Stomach cancer Neg Hx   ? ? ?Current Outpatient Medications (Endocrine & Metabolic):  ?  levothyroxine (SYNTHROID) 75 MCG tablet, TAKE 1 TABLET BY MOUTH EVERY DAY ? ?Current Outpatient Medications (Cardiovascular):  ?  atorvastatin (LIPITOR) 10 MG tablet, TAKE 1 TABLET BY MOUTH EVERY DAY ?  bisoprolol-hydrochlorothiazide (ZIAC) 2.5-6.25 MG tablet, Take 1 tablet by mouth daily. ?  EPINEPHrine (EPIPEN JR) 0.15 MG/0.3ML injection, Inject 0.15 mg into the muscle as needed. ? ? ? ?Current Outpatient Medications (Hematological):  ?  vitamin B-12 (CYANOCOBALAMIN) 1000 MCG tablet, Take 1,000 mcg by mouth daily. ? ?Current Outpatient Medications (Other):  ?  Ascorbic Acid (VITAMIN C WITH ROSE HIPS) 500 MG tablet, Take 500 mg by mouth daily. ?  CALCIUM-MAGNESIUM-ZINC PO, Take by mouth. ?  Cholecalciferol (VITAMIN D3) 10 MCG (400 UNIT) CAPS, Take 10 mcg by mouth. ?  Clobetasol Prop Emollient Base (CLOBETASOL PROPIONATE E) 0.05 % emollient cream, Apply 1 application topically at bedtime. ?  co-enzyme Q-10 30 MG capsule, Take 200 mg by mouth daily. ?  diclofenac sodium (VOLTAREN) 1 % GEL, Apply topically to affected area qid ?  hydrocortisone 2.5 % cream, Apply topically in the morning  and at bedtime. ?  Multiple Vitamins-Minerals (WOMENS 50+ ADVANCED PO), Take 2 tablets by mouth daily. ?  omeprazole (PRILOSEC) 20 MG capsule, TAKE 1-2 CAPSULES BY MOUTH EVERY DAY ?  Saccharomyces boulardii (PROBIOTIC) 250 MG CAPS, Take by mouth. ?  Vibegron 75 MG TABS, Take 1 tablet by mouth daily. ? ? ? ?  Objective  ?Blood pressure 122/86, pulse 69, height '5\' 2"'$  (1.575 m), weight 178 lb (80.7 kg), SpO2 97 %. ?  ?General: No apparent distress alert and oriented x3 mood and affect normal, dressed appropriately.  ?HEENT: Pupils equal, extraocular movements intact  ?Respiratory: Patient's speak in full sentences and does not appear short of breath  ?Cardiovascular: No lower extremity edema, non tender, no erythema  ?Gait normal with good balance and coordination.  ?MSK:  knee pain noted on exam.  Does have some mild degenerative changes noted.  Mild crepitus noted.  Some mild instability with valgus and varus force. ? ?  ?Impression and Recommendations:  ?  ? ?The above documentation has been reviewed and is accurate and complete Lyndal Pulley, DO  ? ? ?

## 2021-12-09 ENCOUNTER — Other Ambulatory Visit: Payer: Self-pay

## 2021-12-09 ENCOUNTER — Ambulatory Visit (INDEPENDENT_AMBULATORY_CARE_PROVIDER_SITE_OTHER): Payer: Medicare Other | Admitting: Family Medicine

## 2021-12-09 DIAGNOSIS — M170 Bilateral primary osteoarthritis of knee: Secondary | ICD-10-CM | POA: Diagnosis not present

## 2021-12-09 NOTE — Patient Instructions (Signed)
Good to see you! ? ?Glad you're doing pretty well ?Read about PRP just in case ?See you again in 3 months ?

## 2021-12-09 NOTE — Assessment & Plan Note (Signed)
Discussed with patient we can continue to monitor at this moment.  Discussed icing regimen and home exercises, which activities to do which ones continue.  Patient wants to hold on anything such as steroid injections and has responded relatively well to viscosupplementation.  Patient could be a candidate for PRP and handout given as well. ?

## 2022-01-15 ENCOUNTER — Encounter: Payer: Self-pay | Admitting: Obstetrics and Gynecology

## 2022-01-15 ENCOUNTER — Ambulatory Visit (INDEPENDENT_AMBULATORY_CARE_PROVIDER_SITE_OTHER): Payer: Medicare Other | Admitting: Obstetrics and Gynecology

## 2022-01-15 DIAGNOSIS — Z20822 Contact with and (suspected) exposure to covid-19: Secondary | ICD-10-CM | POA: Diagnosis not present

## 2022-01-15 DIAGNOSIS — N3281 Overactive bladder: Secondary | ICD-10-CM

## 2022-01-15 MED ORDER — VIBEGRON 75 MG PO TABS: 1.0000 | ORAL_TABLET | Freq: Every day | ORAL | 5 refills | Status: DC

## 2022-01-15 NOTE — Progress Notes (Signed)
[facility] ?Return Visit ? ?SUBJECTIVE  ?History of Present Illness: ?Kelly Frazier is a 76 y.o. female seen in follow-up for overactive bladder, FI, and lichen simplex.  ? ?Stopped the Gemtesa due to cost. Tried PT for a few visits but was too busy to do the exercises.  ? ?She has been considering her options and feels she is too busy for PTNS. Wants to consider sacral neuromodulation.  ? ?Past Medical History: ?Patient  has a past medical history of Allergy, Arthritis, Cancer ([place]) (1980's), Cataract, Diverticulosis, Excessive daytime sleepiness (06/05/2016), GERD (gastroesophageal reflux disease), Head injury due to trauma, Headache(784.0), colonic polyps, Hyperlipidemia, Hypertension, and Thyroid disease.  ? ?Past Surgical History: ?She  has a past surgical history that includes bcca from eyelid; Colonoscopy (08/17/06); Axillary abcess irrigation and debridement; NSVD (1971); Cataract extraction, bilateral; Polypectomy; removal of moles and denmatoid; osseous implant; and Breast biopsy (2006).  ? ?Medications: ?She has a current medication list which includes the following prescription(s): vitamin c with rose hips, atorvastatin, bisoprolol-hydrochlorothiazide, calcium-magnesium-zinc, vitamin d3, clobetasol prop emollient base, co-enzyme q-10, diclofenac sodium, epinephrine, hydrocortisone, levothyroxine, multiple vitamins-minerals, omeprazole, probiotic, and vitamin b-12.  ? ?Allergies: ?Patient is allergic to naprosyn [naproxen] and terbinafine hcl.  ? ?Social History: ?Patient  reports that she has never smoked. She has never used smokeless tobacco. She reports current alcohol use of about 2.0 standard drinks per week. She reports that she does not use drugs.  ?  ?  ?OBJECTIVE  ?  ? ?Physical Exam: ?Vitals:  ? 01/15/22 1352  ?BP: 139/86  ?Pulse: 70  ? ? ?Gen: No apparent distress, A&O x 3. ? ?Detailed Urogynecologic Evaluation:  ?Deferred.   ? ?ASSESSMENT AND PLAN  ?  ?Kelly Frazier is a 76 y.o.  with:  ?No diagnosis found. ? ?- We reviewed the two options for SNM- [employer] or [employer]. Brochures provided on both options.  ?- Reviewed that she will need to fill out a baseline bladder diary for 3 days, this was provided to her.  ?- She will notify us when she is ready to move forward, as she is unsure about her schedule at this time.  ?- In the meantime, would like Gemtesa sent to the pharmacy and will try a cost savings card.  ? ?Kelly Folds, MD ? ?Time spent: I spent 25 minutes dedicated to the care of this patient on the date of this encounter to include pre-visit review of records, face-to-face time with the patient and post visit documentation. ? ? ?

## 2022-01-23 DIAGNOSIS — D329 Benign neoplasm of meninges, unspecified: Secondary | ICD-10-CM | POA: Diagnosis not present

## 2022-01-23 DIAGNOSIS — D320 Benign neoplasm of cerebral meninges: Secondary | ICD-10-CM | POA: Diagnosis not present

## 2022-02-05 ENCOUNTER — Other Ambulatory Visit (HOSPITAL_BASED_OUTPATIENT_CLINIC_OR_DEPARTMENT_OTHER): Payer: Self-pay | Admitting: Family Medicine

## 2022-02-05 DIAGNOSIS — Z1231 Encounter for screening mammogram for malignant neoplasm of breast: Secondary | ICD-10-CM

## 2022-02-11 ENCOUNTER — Ambulatory Visit (HOSPITAL_BASED_OUTPATIENT_CLINIC_OR_DEPARTMENT_OTHER)
Admission: RE | Admit: 2022-02-11 | Discharge: 2022-02-11 | Disposition: A | Discharge status: Home / Self Care | Payer: Medicare Other | Source: Ambulatory Visit | Attending: Family Medicine | Admitting: Family Medicine

## 2022-02-11 ENCOUNTER — Encounter (HOSPITAL_BASED_OUTPATIENT_CLINIC_OR_DEPARTMENT_OTHER): Payer: Self-pay

## 2022-02-11 DIAGNOSIS — Z1231 Encounter for screening mammogram for malignant neoplasm of breast: Secondary | ICD-10-CM | POA: Insufficient documentation

## 2022-03-06 NOTE — Progress Notes (Unsigned)
[place] Phone: [phone] Subjective:   [name], am serving as a scribe for Dr. Hulan Saas.   I'm seeing this patient by the request  of:  Marin Olp, MD  CC: knee pain   [id number]  12/09/2021 Discussed with patient we can continue to monitor at this moment.  Discussed icing regimen and home exercises, which activities to do which ones continue.  Patient wants to hold on anything such as steroid injections and has responded relatively well to viscosupplementation.  Patient could be a candidate for PRP and handout given as well.  Updated 03/12/2022 Kelly Frazier is a 76 y.o. female coming in with complaint of B knee pain. Pain over medial aspect of R knee. Also notes instability in both knees.       Past Medical History:  Diagnosis Date   Allergy    Arthritis    OA   Cancer ([place]) 1980's   basal cell carcinoma   Cataract    bilateral 2 yrs ago    Diverticulosis    Excessive daytime sleepiness 06/05/2016   GERD (gastroesophageal reflux disease)    Head injury due to trauma    s/p attack - some memory loss    Headache(784.0)    Hx of colonic polyps    Hyperlipidemia    no meds    Hypertension    pt denies- on  meds    Thyroid disease    Past Surgical History:  Procedure Laterality Date   AXILLARY ABCESS IRRIGATION AND DEBRIDEMENT     cyst   bcca from eyelid     basal cell 1980s.    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 1 son. 2 twin grandkids age 52 in 30- in [place].       Retired from [place].       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 handed   Drinks caffeine one story   Social Determinants of Health   Financial Resource Strain: Medium Risk (04/15/2021)   Overall Financial Resource Strain (CARDIA)    Difficulty of Paying Living Expenses: Somewhat hard  Food Insecurity: No Food Insecurity (04/15/2021)   Hunger Vital Sign    Worried About Running Out of Food in the Last Year: Never true    [place] in the Last Year:  Never true  Transportation Needs: No Transportation Needs (04/15/2021)   PRAPARE - Hydrologist (Medical): No    Lack of Transportation (Non-Medical): No  Physical Activity: Inactive (04/15/2021)   Exercise Vital Sign    Days of Exercise per Week: 0 days    Minutes of Exercise per Session: 0 min  Stress: No Stress Concern Present (04/15/2021)   [place]    Feeling of Stress : Only a little  Social Connections: Unknown (04/15/2021)   Social Connection and Isolation Panel [NHANES]    Frequency of Communication with Friends and Family: More than three times a week    Frequency of Social Gatherings with Friends and Family: More than three times a week    Attends Religious Services: Never    Marine scientist or Organizations: Yes    Attends Archivist Meetings: 1 to 4 times per year    Marital Status: Not on file   Allergies  Allergen Reactions   Naprosyn [Naproxen] Swelling   Terbinafine Hcl Swelling     Lamisil    Family History  Problem Relation Age of Onset   Cancer Mother        lung- nonsmoker. in her 61s, perhaps related to job   Hypertension Mother    Lung cancer Mother    Liver disease Father    Alcohol abuse Father    Hypertension Father    Ovarian cancer Maternal Grandmother    Hodgkin's lymphoma Maternal Grandfather    Dementia Paternal Grandmother    Tuberculosis Paternal Grandfather    Colon cancer Maternal Aunt    Colon cancer Maternal Uncle    Colon polyps Neg Hx    Esophageal cancer Neg Hx    Rectal cancer Neg Hx    Stomach cancer Neg Hx     Current Outpatient Medications (Endocrine & Metabolic):    levothyroxine (SYNTHROID) 75 MCG tablet, TAKE 1 TABLET BY MOUTH EVERY DAY  Current Outpatient Medications (Cardiovascular):    atorvastatin (LIPITOR) 10 MG tablet, TAKE 1 TABLET BY MOUTH EVERY DAY   bisoprolol-hydrochlorothiazide (ZIAC) 2.5-6.25 MG tablet, Take 1 tablet by mouth daily.   EPINEPHrine (EPIPEN JR) 0.15 MG/0.3ML injection, Inject 0.15 mg into the muscle as needed.    Current Outpatient Medications (Hematological):    vitamin B-12 (CYANOCOBALAMIN) 1000 MCG tablet, Take 1,000 mcg by mouth daily.  Current Outpatient Medications (Other):    Ascorbic Acid (VITAMIN C WITH ROSE HIPS) 500 MG tablet, Take 500 mg by mouth daily.   CALCIUM-MAGNESIUM-ZINC PO, Take by mouth.   Cholecalciferol (VITAMIN D3) 10 MCG (400 UNIT) CAPS, Take 10 mcg by mouth.   Clobetasol Prop Emollient Base (CLOBETASOL PROPIONATE E) 0.05 % emollient cream, Apply 1 application topically at bedtime.   co-enzyme Q-10 30 MG capsule, Take 200 mg by mouth daily.   diclofenac sodium (VOLTAREN) 1 % GEL, Apply topically to affected area qid   hydrocortisone 2.5 % cream, Apply topically in the morning and at bedtime.   Multiple Vitamins-Minerals (WOMENS 50+ ADVANCED PO), Take 2 tablets by mouth daily.   omeprazole (PRILOSEC) 20 MG capsule, TAKE 1-2 CAPSULES BY MOUTH EVERY DAY   Saccharomyces  boulardii (PROBIOTIC) 250 MG CAPS, Take by mouth.   Vibegron 75 MG TABS, Take 1 tablet by mouth daily.   Reviewed prior external information including notes and imaging from  primary care provider As well as notes that were available from care everywhere and  other healthcare systems.  Past medical history, social, surgical and family history all reviewed in electronic medical record.  No pertanent information unless stated regarding to the chief complaint.   Review of Systems:  No headache, visual changes, nausea, vomiting, diarrhea, constipation, dizziness, abdominal pain, skin rash, fevers, chills, night sweats, weight loss, swollen lymph nodes, body aches, joint swelling, chest pain, shortness of breath, mood changes. POSITIVE muscle aches  Objective  There were no vitals taken for this visit.   General: No apparent distress alert and oriented x3 mood and affect normal, dressed appropriately.  HEENT: Pupils equal, extraocular movements intact  Respiratory: Patient's speak in full sentences and does not appear short of breath  Cardiovascular: No lower extremity edema, non tender, no erythema      Impression and Recommendations:     The above documentation has been reviewed and is accurate and complete Lyndal Pulley, DO

## 2022-03-12 ENCOUNTER — Ambulatory Visit (INDEPENDENT_AMBULATORY_CARE_PROVIDER_SITE_OTHER): Payer: Medicare Other | Admitting: Family Medicine

## 2022-03-12 DIAGNOSIS — M170 Bilateral primary osteoarthritis of knee: Secondary | ICD-10-CM | POA: Diagnosis not present

## 2022-03-12 NOTE — Patient Instructions (Signed)
Great to see you  Ice 20 minutes 2 times daily. Usually after activity and before bed. Try the heel lift in the toe of your shoe  [place] or OOFOS recovery sandals would be helpful in the house If the knees worsen call us otherwise see me again in 2-3 months

## 2022-03-13 NOTE — Assessment & Plan Note (Signed)
Discussed with patient again at great length.  Has been 4 months since we have done the viscosupplementation.  Discussed with patient about the icing regimen, home exercises.  Can consider the possibility of steroid injections or the viscosupplementation.  Patient would be due again in August if we wanted to do this and I think could be we will try to get approval.  Follow-up with me again in 2 months otherwise. Total time reviewing patient's chart as well as discussing with patient 31 minutes

## 2022-04-18 DIAGNOSIS — H04123 Dry eye syndrome of bilateral lacrimal glands: Secondary | ICD-10-CM | POA: Diagnosis not present

## 2022-04-18 DIAGNOSIS — H02831 Dermatochalasis of right upper eyelid: Secondary | ICD-10-CM | POA: Diagnosis not present

## 2022-04-18 DIAGNOSIS — Z961 Presence of intraocular lens: Secondary | ICD-10-CM | POA: Diagnosis not present

## 2022-04-18 DIAGNOSIS — H02834 Dermatochalasis of left upper eyelid: Secondary | ICD-10-CM | POA: Diagnosis not present

## 2022-04-18 DIAGNOSIS — H53453 Other localized visual field defect, bilateral: Secondary | ICD-10-CM | POA: Diagnosis not present

## 2022-04-21 ENCOUNTER — Ambulatory Visit (INDEPENDENT_AMBULATORY_CARE_PROVIDER_SITE_OTHER): Payer: Medicare Other

## 2022-04-21 VITALS — BP 118/64 | HR 83 | Temp 98.6°F | Wt 180.8 lb

## 2022-04-21 DIAGNOSIS — Z0000 Encounter for general adult medical examination without abnormal findings: Secondary | ICD-10-CM | POA: Diagnosis not present

## 2022-04-21 NOTE — Progress Notes (Addendum)
Subjective:   Kelly Frazier is a 76 y.o. female who presents for Medicare Annual (Subsequent) preventive examination.  Review of Systems     Cardiac Risk Factors include: advanced age (>68mn, >>63women);dyslipidemia;hypertension;obesity (BMI >30kg/m2)     Objective:    Today's Vitals   04/21/22 1520 04/21/22 1546  BP: 100/62 118/64  Pulse: 83   Temp: 98.6 F (37 C)   SpO2: 96%   Weight: 180 lb 12.8 oz (82 kg)    Body mass index is 33.07 kg/m.     08/28/2021    2:15 PM 06/25/2021    9:52 AM 04/15/2021    3:34 PM 12/19/2020    5:10 PM 09/02/2019   11:47 AM 08/25/2018    2:15 PM 08/18/2017    1:24 PM  Advanced Directives  Does Patient Have a Medical Advance Directive? No No Yes No Yes No Yes  Type of Advance Directive     Living will;Healthcare Power of [place] will  Does patient want to make changes to medical advance directive?   Yes (MAU/Ambulatory/Procedural Areas - Information given)  No - Patient declined  No - Patient declined  Copy of HDu [place] Chart?     No - copy requested  No - copy requested  Would patient like information on creating a medical advance directive? No - Patient declined   No - Patient declined  No - Patient declined     Current Medications (verified) Outpatient Encounter Medications as of 04/21/2022  Medication Sig   Ascorbic Acid (VITAMIN C WITH ROSE HIPS) 500 MG tablet Take 500 mg by mouth daily.   atorvastatin (LIPITOR) 10 MG tablet TAKE 1 TABLET BY MOUTH EVERY DAY   Biotin 10 MG CHEW Chew by mouth.   bisoprolol-hydrochlorothiazide (ZIAC) 2.5-6.25 MG tablet Take 1 tablet by mouth daily.   CALCIUM-MAGNESIUM-ZINC PO Take by mouth.   Cholecalciferol (VITAMIN D3) 10 MCG (400 UNIT) CAPS Take 10 mcg by mouth.   Clobetasol Prop Emollient Base (CLOBETASOL PROPIONATE E) 0.05 % emollient cream Apply 1 application topically at bedtime.   co-enzyme Q-10 30 MG capsule Take 200 mg by mouth  daily.   diclofenac sodium (VOLTAREN) 1 % GEL Apply topically to affected area qid   EPINEPHrine (EPIPEN JR) 0.15 MG/0.3ML injection Inject 0.15 mg into the muscle as needed.   hydrocortisone 2.5 % cream Apply topically in the morning and at bedtime.   levothyroxine (SYNTHROID) 75 MCG tablet TAKE 1 TABLET BY MOUTH EVERY DAY   Multiple Vitamins-Minerals (WOMENS 50+ ADVANCED PO) Take 2 tablets by mouth daily.   omeprazole (PRILOSEC) 20 MG capsule TAKE 1-2 CAPSULES BY MOUTH EVERY DAY   Saccharomyces boulardii (PROBIOTIC) 250 MG CAPS Take by mouth.   vitamin B-12 (CYANOCOBALAMIN) 1000 MCG tablet Take 1,000 mcg by mouth daily.   [DISCONTINUED] Vibegron 75 MG TABS Take 1 tablet by mouth daily.   No facility-administered encounter medications on file as of 04/21/2022.    Allergies (verified) Naprosyn [naproxen] and Terbinafine hcl   History: Past Medical History:  Diagnosis Date   Allergy    Arthritis    OA   Cancer ([place] 1980's   basal cell carcinoma   Cataract    bilateral 2 yrs ago    Diverticulosis    Excessive daytime sleepiness 06/05/2016   GERD (gastroesophageal reflux disease)    Head injury due to trauma    s/p attack - some memory loss    Headache(784.0)  Hx of colonic polyps    Hyperlipidemia    no meds    Hypertension    pt denies- on  meds    Thyroid disease    Past Surgical History:  Procedure Laterality Date   AXILLARY ABCESS IRRIGATION AND DEBRIDEMENT     cyst   bcca from eyelid     basal cell 1980s.    BREAST BIOPSY  2006   and 2017   CATARACT EXTRACTION, BILATERAL     COLONOSCOPY  08/17/06   NSVD  1971   x1   osseous implant     POLYPECTOMY     removal of moles and denmatoid     Family History  Problem Relation Age of Onset   Cancer Mother        lung- nonsmoker. in her 45s, perhaps related to job   Hypertension Mother    Lung cancer Mother    Liver disease Father    Alcohol abuse Father    Hypertension Father    Ovarian cancer Maternal  Grandmother    Hodgkin's lymphoma Maternal Grandfather    Dementia Paternal Grandmother    Tuberculosis Paternal Grandfather    Colon cancer Maternal Aunt    Colon cancer Maternal Uncle    Colon polyps Neg Hx    Esophageal cancer Neg Hx    Rectal cancer Neg Hx    Stomach cancer Neg Hx    Social History   Socioeconomic History   Marital status: Single    Spouse name: Not on file   Number of children: Not on file   Years of education: Not on file   Highest education level: Not on file  Occupational History    Comment: [employer] for 29 years and 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 1 son. 2 twin grandkids age 78 in 54- in [place].       Retired from [place] [employer].       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 handed   Drinks caffeine one story   Social Determinants of Health   Financial Resource Strain: Medium Risk (04/21/2022)   Overall Financial Resource Strain (CARDIA)    Difficulty of Paying Living Expenses: Somewhat hard  Food Insecurity: No Food Insecurity (04/21/2022)   Hunger Vital Sign    Worried About Running Out of Food in the Last Year: Never true    [place] in the Last Year: Never true  Transportation Needs: No Transportation Needs (04/21/2022)   PRAPARE - Hydrologist (Medical): No    Lack of Transportation (Non-Medical): No  Physical Activity: Inactive (04/21/2022)   Exercise Vital Sign    Days of Exercise per Week: 0 days    Minutes of Exercise per Session: 0 min  Stress: No Stress Concern Present (04/21/2022)   [place]    Feeling of Stress : Only a little  Social Connections: Moderately Isolated (04/21/2022)   Social Connection and Isolation  Panel [NHANES]    Frequency of Communication with Friends and Family: More than three times a week    Frequency of Social Gatherings with Friends and Family: More than three times a week    Attends Religious Services: Never    Marine scientist or Organizations: Yes    Attends Music therapist: 1 to 4 times per year    Marital Status: Never married    Tobacco Counseling Counseling given: Not Answered   Clinical Intake:  Pre-visit preparation completed: Yes  Pain : No/denies pain     BMI - recorded: 33.07 Nutritional Status: BMI > 30  Obese Nutritional Risks: None Diabetes: No  How often do you need to have someone help you when you read instructions, pamphlets, or other written materials from your doctor or pharmacy?: 1 - Never  Diabetic?no  Interpreter Needed?: No  Information entered by :: Charlott Rakes, LPN   Activities of Daily Living    04/21/2022    3:37 PM  In your present state of health, do you have any difficulty performing the following activities:  Hearing? 1  Comment wears hearing aids  Vision? 0  Difficulty concentrating or making decisions? 0  Walking or climbing stairs? 0  Dressing or bathing? 0  Doing errands, shopping? 0  Preparing Food and eating ? N  Using the Toilet? N  In the past six months, have you accidently leaked urine? Y  Comment weras a pad  Do you have problems with loss of bowel control? N  Managing your Medications? N  Managing your Finances? N  Housekeeping or managing your Housekeeping? N    Patient Care Team: Marin Olp, MD as PCP - General (Family Medicine) Calvert Cantor, MD as Consulting Physician (Ophthalmology) Burgin, Caffie Damme, MD as Consulting Physician (Obstetrics and Gynecology) Madelin Rear, [facility] as Pharmacist  (Pharmacist)  Indicate any recent Medical Services you may have received from other than [facility] providers in the past year (date may be approximate).     Assessment:   This is a routine wellness examination for [place].  Hearing/Vision screen Hearing Screening - Comments:: Pt wears hearing aids  Vision Screening - Comments:: Pt follows up with Dr Eulas [facility]   Dietary issues and exercise activities discussed: Current Exercise Habits: The patient does not participate in regular exercise at present   Goals Addressed             This Visit's Progress    Patient Stated       Lose weight        Depression Screen    04/21/2022    3:33 PM 04/15/2021    3:28 PM 12/13/2020    2:32 PM 04/25/2020    4:17 PM 10/21/2019    1:50 PM 09/02/2019   11:48 AM 08/25/2018    2:16 PM  PHQ 2/9 Scores  PHQ - 2 Score 0 0 0 0 0 0 0  PHQ- 9 Score     1      Fall Risk    04/21/2022    3:37 PM 06/25/2021    9:51 AM 04/15/2021    4:29 PM 04/15/2021    3:35 PM 01/04/2021    3:30 PM  [place] in the past year? 0 '1 1 1 1  '$ Number falls in past yr: 0 0 0 1 0  Injury with Fall? 0 '1 1 1 1  '$ Comment    injured back, ribs, shoulder   Risk  for fall due to : Impaired vision      Follow up Falls prevention discussed        FALL RISK PREVENTION PERTAINING TO THE HOME:  Any stairs in or around the home? No  If so, are there any without handrails? No  Home free of loose throw rugs in walkways, pet beds, electrical cords, etc? Yes  Adequate lighting in your home to reduce risk of falls? Yes   ASSISTIVE DEVICES UTILIZED TO PREVENT FALLS:  Life alert? No  Use of a cane, walker or w/c? No  Grab bars in the bathroom? Yes  Shower chair or bench in shower? Yes  Elevated toilet seat or a handicapped toilet? No   TIMED UP AND GO:  Was the test performed? Yes .  Length of time to ambulate 10 feet: 10 sec.   Gait steady and fast without use of assistive device  Cognitive Function:       06/25/2021   10:00 AM  Montreal Cognitive Assessment   Visuospatial/ Executive (0/5) 4  Naming (0/3) 3  Attention: Read list of digits (0/2) 2  Attention: Read list of letters (0/1) 1  Attention: Serial 7 subtraction starting at 100 (0/3) 3  Language: Repeat phrase (0/2) 2  Language : Fluency (0/1) 1  Abstraction (0/2) 2  Delayed Recall (0/5) 3  Orientation (0/6) 6  Total 27  Adjusted Score (based on education) 27      04/21/2022    3:38 PM 04/15/2021    3:40 PM  6CIT Screen  What Year? 0 points 0 points  What month? 0 points 0 points  What time? 0 points 0 points  Count back from 20 0 points 0 points  Months in reverse 0 points 0 points  Repeat phrase 0 points 0 points  Total Score 0 points 0 points    Immunizations Immunization History  Administered Date(s) Administered   Fluad Quad(high Dose 65+) 05/16/2019, 05/19/2020   Influenza Split 06/23/2011   Influenza Whole 08/31/2007, 06/27/2008, 06/21/2009, 06/21/2010, 07/31/2010   Influenza, High Dose Seasonal PF 06/16/2016, 06/11/2017, 07/13/2018, 05/16/2020, 05/30/2021   Influenza,inj,Quad PF,6+ Mos 06/14/2013, 06/28/2014, 06/28/2015   Moderna Covid-19 Vaccine Bivalent Booster 11yr & up 06/29/2021   Moderna Sars-Covid-2 Vaccination 10/18/2019, 11/17/2019, 07/20/2020, 01/09/2021   Pneumococcal Conjugate-13 02/21/2016   Pneumococcal Polysaccharide-23 09/14/2013   Td 09/15/2005, 11/24/2014   Zoster Recombinat (Shingrix) 06/14/2018, 10/11/2018   Zoster, Live 12/31/2007    TDAP status: Up to date  Flu Vaccine status: Up to date  Pneumococcal vaccine status: Up to date  Covid-19 vaccine status: Completed vaccines  Qualifies for Shingles Vaccine? Yes   Zostavax completed Yes   Shingrix Completed?: Yes  Screening Tests Health Maintenance  Topic Date Due   COVID-19 Vaccine (6 - Moderna series) 10/30/2021   INFLUENZA VACCINE  04/15/2022   COLONOSCOPY (Pts 45-421yrInsurance coverage will need to be confirmed)   06/29/2022   MAMMOGRAM  02/12/2024   TETANUS/TDAP  11/23/2024   Pneumonia Vaccine 6536Years old  Completed   DEXA SCAN  Completed   Hepatitis C Screening  Completed   Zoster Vaccines- Shingrix  Completed   HPV VACCINES  Aged Out    Health Maintenance  Health Maintenance Due  Topic Date Due   COVID-19 Vaccine (6 - Moderna series) 10/30/2021   INFLUENZA VACCINE  04/15/2022    Colorectal cancer screening: Type of screening: Colonoscopy. Completed 06/29/17. Repeat every 5 years  Mammogram status: Completed 02/11/22. Repeat every year  Bone Density status:  Completed 04/30/11. Results reflect: Bone density results: NORMAL. Repeat every 2 years.   Additional Screening:  Hepatitis C Screening:  Completed 02/21/16  Vision Screening: Recommended annual ophthalmology exams for early detection of glaucoma and other disorders of the eye. Is the patient up to date with their annual eye exam?  Yes  Who is the provider or what is the name of the office in which the patient attends annual eye exams? Dr Eulas Post If pt is not established with a provider, would they like to be referred to a provider to establish care? No .   Dental Screening: Recommended annual dental exams for proper oral hygiene  Community Resource Referral / Chronic Care Management: CRR required this visit?  No   CCM required this visit?  No      Plan:     I have personally reviewed and noted the following in the patient's chart:   Medical and social history Use of alcohol, tobacco or illicit drugs  Current medications and supplements including opioid prescriptions.  Functional ability and status Nutritional status Physical activity Advanced directives List of other physicians Hospitalizations, surgeries, and ER visits in previous 12 months Vitals Screenings to include cognitive, depression, and falls Referrals and appointments  In addition, I have reviewed and discussed with patient certain preventive protocols,  quality metrics, and best practice recommendations. A written personalized care plan for preventive services as well as general preventive health recommendations were provided to patient.     Willette Brace, LPN   12/19/2861   Nurse Notes: none

## 2022-04-21 NOTE — Patient Instructions (Signed)
[name] , Thank you for taking time to come for your Medicare Wellness Visit. I appreciate your ongoing commitment to your health goals. Please review the following plan we discussed and let me know if I can assist you in the future.   Screening recommendations/referrals: Colonoscopy: Done 06/29/17 repeat every 5 years  Mammogram: done 02/11/22 repeat every year  Bone Density: completed 04/30/11 Recommended yearly ophthalmology/optometry visit for glaucoma screening and checkup Recommended yearly dental visit for hygiene and checkup  Vaccinations: Influenza vaccine: done 05/30/21 repeat every years  Pneumococcal vaccine: Up to date Tdap vaccine: done 11/24/14 repeat every 10 years  Shingles vaccine: completed 06/14/18 & 10/11/18   Covid-19:completed 2/2, 3/4, 07/20/20 4/27, 06/29/21  Advanced directives: Please bring a copy of your health care power of attorney and living will to the office at your convenience.  Conditions/risks identified: lose weight   Next appointment: Follow up in one year for your annual wellness visit    Preventive Care 65 Years and Older, Female Preventive care refers to lifestyle choices and visits with your health care provider that can promote health and wellness. What does preventive care include? A yearly physical exam. This is also called an annual well check. Dental exams once or twice a year. Routine eye exams. Ask your health care provider how often you should have your eyes checked. Personal lifestyle choices, including: Daily care of your teeth and gums. Regular physical activity. Eating a healthy diet. Avoiding tobacco and drug use. Limiting alcohol use. Practicing safe sex. Taking low-dose aspirin every day. Taking vitamin and mineral supplements as recommended by your health care provider. What happens during an annual well check? The services and screenings done by your health care provider during your annual well check will depend on your  age, overall health, lifestyle risk factors, and family history of disease. Counseling  Your health care provider may ask you questions about your: Alcohol use. Tobacco use. Drug use. Emotional well-being. Home and relationship well-being. Sexual activity. Eating habits. History of falls. Memory and ability to understand (cognition). Work and work Statistician. Reproductive health. Screening  You may have the following tests or measurements: Height, weight, and BMI. Blood pressure. Lipid and cholesterol levels. These may be checked every 5 years, or more frequently if you are over 49 years old. Skin check. Lung cancer screening. You may have this screening every year starting at age 74 if you have a 30-pack-year history of smoking and currently smoke or have quit within the past 15 years. Fecal occult blood test (FOBT) of the stool. You may have this test every year starting at age 50. Flexible sigmoidoscopy or colonoscopy. You may have a sigmoidoscopy every 5 years or a colonoscopy every 10 years starting at age 53. Hepatitis C blood test. Hepatitis B blood test. Sexually transmitted disease (STD) testing. Diabetes screening. This is done by checking your blood sugar (glucose) after you have not eaten for a while (fasting). You may have this done every 1-3 years. Bone density scan. This is done to screen for osteoporosis. You may have this done starting at age 21. Mammogram. This may be done every 1-2 years. Talk to your health care provider about how often you should have regular mammograms. Talk with your health care provider about your test results, treatment options, and if necessary, the need for more tests. Vaccines  Your health care provider may recommend certain vaccines, such as: Influenza vaccine. This is recommended every year. Tetanus, diphtheria, and acellular pertussis (Tdap, Td)  vaccine. You may need a Td booster every 10 years. Zoster vaccine. You may need this after  age 33. Pneumococcal 13-valent conjugate (PCV13) vaccine. One dose is recommended after age 15. Pneumococcal polysaccharide (PPSV23) vaccine. One dose is recommended after age 66. Talk to your health care provider about which screenings and vaccines you need and how often you need them. This information is not intended to replace advice given to you by your health care provider. Make sure you discuss any questions you have with your health care provider. Document Released: 09/28/2015 Document Revised: 05/21/2016 Document Reviewed: 07/03/2015 Elsevier Interactive Patient Education  2017 [place] Prevention in the Home Falls can cause injuries. They can happen to people of all ages. There are many things you can do to make your home safe and to help prevent falls. What can I do on the outside of my home? Regularly fix the edges of walkways and driveways and fix any cracks. Remove anything that might make you trip as you walk through a door, such as a raised step or threshold. Trim any bushes or trees on the path to your home. Use bright outdoor lighting. Clear any walking paths of anything that might make someone trip, such as rocks or tools. Regularly check to see if handrails are loose or broken. Make sure that both sides of any steps have handrails. Any raised decks and porches should have guardrails on the edges. Have any leaves, snow, or ice cleared regularly. Use sand or salt on walking paths during winter. Clean up any spills in your garage right away. This includes oil or grease spills. What can I do in the bathroom? Use night lights. Install grab bars by the toilet and in the tub and shower. Do not use towel bars as grab bars. Use non-skid mats or decals in the tub or shower. If you need to sit down in the shower, use a plastic, non-slip stool. Keep the floor dry. Clean up any water that spills on the floor as soon as it happens. Remove soap buildup in the tub or shower  regularly. Attach bath mats securely with double-sided non-slip rug tape. Do not have throw rugs and other things on the floor that can make you trip. What can I do in the bedroom? Use night lights. Make sure that you have a light by your bed that is easy to reach. Do not use any sheets or blankets that are too big for your bed. They should not hang down onto the floor. Have a firm chair that has side arms. You can use this for support while you get dressed. Do not have throw rugs and other things on the floor that can make you trip. What can I do in the kitchen? Clean up any spills right away. Avoid walking on wet floors. Keep items that you use a lot in easy-to-reach places. If you need to reach something above you, use a strong step stool that has a grab bar. Keep electrical cords out of the way. Do not use floor polish or wax that makes floors slippery. If you must use wax, use non-skid floor wax. Do not have throw rugs and other things on the floor that can make you trip. What can I do with my stairs? Do not leave any items on the stairs. Make sure that there are handrails on both sides of the stairs and use them. Fix handrails that are broken or loose. Make sure that handrails are as long  as the stairways. Check any carpeting to make sure that it is firmly attached to the stairs. Fix any carpet that is loose or worn. Avoid having throw rugs at the top or bottom of the stairs. If you do have throw rugs, attach them to the floor with carpet tape. Make sure that you have a light switch at the top of the stairs and the bottom of the stairs. If you do not have them, ask someone to add them for you. What else can I do to help prevent falls? Wear shoes that: Do not have high heels. Have rubber bottoms. Are comfortable and fit you well. Are closed at the toe. Do not wear sandals. If you use a stepladder: Make sure that it is fully opened. Do not climb a closed stepladder. Make sure that  both sides of the stepladder are locked into place. Ask someone to hold it for you, if possible. Clearly mark and make sure that you can see: Any grab bars or handrails. First and last steps. Where the edge of each step is. Use tools that help you move around (mobility aids) if they are needed. These include: Canes. Walkers. Scooters. Crutches. Turn on the lights when you go into a dark area. Replace any light bulbs as soon as they burn out. Set up your furniture so you have a clear path. Avoid moving your furniture around. If any of your floors are uneven, fix them. If there are any pets around you, be aware of where they are. Review your medicines with your doctor. Some medicines can make you feel dizzy. This can increase your chance of falling. Ask your doctor what other things that you can do to help prevent falls. This information is not intended to replace advice given to you by your health care provider. Make sure you discuss any questions you have with your health care provider. Document Released: 06/28/2009 Document Revised: 02/07/2016 Document Reviewed: 10/06/2014 Elsevier Interactive Patient Education  2017 [employer].

## 2022-05-09 ENCOUNTER — Telehealth: Payer: Self-pay | Admitting: Family Medicine

## 2022-05-09 NOTE — Telephone Encounter (Signed)
Patient called asking to make an appointment with Dr Tamala Julian for gel injections. She said that Dr Tamala Julian told her that when she started having problems, to call us and we would work her in even if it was the first day her knees started hurting. I told her that the appointment she already has scheduled (9/13) is sooner than I can offer and I would put her on the cancellation list. She said that Dr Tamala Julian told her he would work her in and for me to check with him.  Please advise on working in?

## 2022-05-09 NOTE — Telephone Encounter (Signed)
Sent message to let her know we have her on cancellation list and she can also consider coming in on another providers schedule.

## 2022-05-13 NOTE — Progress Notes (Signed)
[place] Phone: [phone] Subjective:   [name], am serving as a scribe for Dr. Hulan Saas.   I'm seeing this patient by the request  of:  Marin Olp, MD  CC: bilateral knee pain   [id number]  03/12/2022 Discussed with patient again at great length.  Has been 4 months since we have done the viscosupplementation.  Discussed with patient about the icing regimen, home exercises.  Can consider the possibility of steroid injections or the viscosupplementation.  Patient would be due again in August if we wanted to do this and I think could be we will try to get approval.  Follow-up with me again in 2 months otherwise. Total time reviewing patient's chart as well as discussing with patient 31 minutes  Updated 05/14/2022 Kelly Frazier is a 76 y.o. female coming in with complaint of bilateral knee pain. Patient states that she is wearing more supportive shoes and sandals around the house. Pain in back of R knee and the medial aspect of both knees.   Also R hip and lumbar spine pain.        Past Medical History:  Diagnosis Date   Allergy    Arthritis    OA   Cancer ([place]) 1980's   basal cell carcinoma   Cataract    bilateral 2 yrs ago    Diverticulosis    Excessive daytime sleepiness 06/05/2016   GERD (gastroesophageal reflux disease)    Head injury due to trauma    s/p attack - some memory loss    Headache(784.0)    Hx of colonic polyps    Hyperlipidemia    no meds    Hypertension    pt denies- on  meds    Thyroid disease    Past Surgical History:  Procedure Laterality Date   AXILLARY ABCESS IRRIGATION AND DEBRIDEMENT     cyst   bcca from eyelid     basal cell 1980s.    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 1 son. 2 twin grandkids age 73 in 4- in [place].       Retired from [place] [employer].       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    Left handed   Drinks caffeine one story   Social Determinants of Health   Financial Resource Strain: Medium Risk (04/21/2022)  Overall Financial Resource Strain (CARDIA)    Difficulty of Paying Living Expenses: Somewhat hard  Food Insecurity: No Food Insecurity (04/21/2022)   Hunger Vital Sign    Worried About Running Out of Food in the Last Year: Never true    Ran Out of Food in the Last Year: Never true  Transportation Needs: No Transportation Needs (04/21/2022)   PRAPARE - Hydrologist (Medical): No    Lack of Transportation (Non-Medical): No  Physical Activity: Inactive (04/21/2022)   Exercise Vital Sign    Days of Exercise per Week: 0 days    Minutes of Exercise per Session: 0 min  Stress: No Stress Concern Present (04/21/2022)   [place]    Feeling of Stress : Only a little  Social Connections: Moderately Isolated (04/21/2022)   Social Connection and Isolation Panel [NHANES]    Frequency of Communication with Friends and Family: More than three times a week    Frequency of Social Gatherings with Friends and Family: More  than three times a week    Attends Religious Services: Never    Marine scientist or Organizations: Yes    Attends Archivist Meetings: 1 to 4 times per year    Marital Status: Never married   Allergies  Allergen Reactions   Naprosyn [Naproxen] Swelling   Terbinafine Hcl Swelling    Lamisil    Family History  Problem Relation Age of Onset   Cancer Mother        lung- nonsmoker. in her 44s, perhaps related to job   Hypertension Mother    Lung cancer Mother    Liver disease Father    Alcohol abuse Father    Hypertension Father    Ovarian cancer Maternal Grandmother    Hodgkin's lymphoma Maternal Grandfather    Dementia Paternal Grandmother    Tuberculosis Paternal Grandfather    Colon cancer Maternal Aunt    Colon cancer Maternal Uncle    Colon polyps Neg Hx    Esophageal cancer Neg Hx    Rectal cancer Neg Hx    Stomach cancer Neg Hx     Current Outpatient Medications (Endocrine & Metabolic):    levothyroxine (SYNTHROID) 75 MCG tablet, TAKE 1 TABLET BY MOUTH EVERY DAY  Current Outpatient Medications (Cardiovascular):    atorvastatin (LIPITOR) 10 MG tablet, TAKE 1 TABLET BY MOUTH EVERY DAY   bisoprolol-hydrochlorothiazide (ZIAC) 2.5-6.25 MG tablet, Take 1 tablet by mouth daily.   EPINEPHrine (EPIPEN JR) 0.15 MG/0.3ML injection, Inject 0.15 mg into the muscle as needed.    Current Outpatient Medications (Hematological):    vitamin B-12 (CYANOCOBALAMIN) 1000 MCG tablet, Take 1,000 mcg by mouth daily.  Current Outpatient Medications (Other):    Ascorbic Acid (VITAMIN C WITH ROSE HIPS) 500 MG tablet, Take 500 mg by mouth daily.   Biotin 10 MG CHEW, Chew by mouth.   CALCIUM-MAGNESIUM-ZINC PO, Take by mouth.   Cholecalciferol (VITAMIN D3) 10 MCG (400 UNIT) CAPS, Take 10 mcg by mouth.   Clobetasol Prop Emollient Base (CLOBETASOL PROPIONATE E) 0.05 % emollient cream, Apply 1 application topically at bedtime.   co-enzyme Q-10 30 MG capsule, Take 200 mg by mouth  daily.   diclofenac sodium (VOLTAREN) 1 % GEL, Apply topically to affected area qid   hydrocortisone 2.5 % cream, Apply topically in the morning and at bedtime.   Multiple Vitamins-Minerals (WOMENS 50+ ADVANCED PO), Take 2 tablets by mouth daily.  omeprazole (PRILOSEC) 20 MG capsule, TAKE 1-2 CAPSULES BY MOUTH EVERY DAY   Saccharomyces boulardii (PROBIOTIC) 250 MG CAPS, Take by mouth.   Reviewed prior external information including notes and imaging from  primary care provider As well as notes that were available from care everywhere and other healthcare systems.  Past medical history, social, surgical and family history all reviewed in electronic medical record.  No pertanent information unless stated regarding to the chief complaint.   Review of Systems:  No headache, visual changes, nausea, vomiting, diarrhea, constipation, dizziness, abdominal pain, skin rash, fevers, chills, night sweats, weight loss, swollen lymph nodes, body aches, chest pain, shortness of breath, mood changes. POSITIVE muscle aches, joint swelling  Objective  Blood pressure 138/74, pulse 62, height '5\' 2"'$  (1.575 m), weight 178 lb (80.7 kg), SpO2 97 %.   General: No apparent distress alert and oriented x3 mood and affect normal, dressed appropriately.  HEENT: Pupils equal, extraocular movements intact  Respiratory: Patient's speak in full sentences and does not appear short of breath  Cardiovascular: No lower extremity edema, non tender, no erythema  Knee exam does show the patient does have crepitus noted with lateral tracking of the knee at times.  Mild instability noted with valgus and varus force.   After informed written and verbal consent, patient was seated on exam table. Right knee was prepped with alcohol swab and utilizing anterolateral approach, patient's right knee space was injected with 48 mg per 3 mL of Monovisc (sodium hyaluronate) in a prefilled syringe was injected easily into the knee through a  22-gauge needle..Patient tolerated the procedure well without immediate complications.  After informed written and verbal consent, patient was seated on exam table. Left knee was prepped with alcohol swab and utilizing anterolateral approach, patient's left knee space was injected with 48 mg per 3 mL of Monovisc (sodium hyaluronate) in a prefilled syringe was injected easily into the knee through a 22-gauge needle..Patient tolerated the procedure well without immediate complications.   Impression and Recommendations:     The above documentation has been reviewed and is accurate and complete Lyndal Pulley, DO

## 2022-05-14 ENCOUNTER — Ambulatory Visit (INDEPENDENT_AMBULATORY_CARE_PROVIDER_SITE_OTHER): Payer: Medicare Other | Admitting: Family Medicine

## 2022-05-14 VITALS — BP 138/74 | HR 62 | Ht 62.0 in | Wt 178.0 lb

## 2022-05-14 DIAGNOSIS — M170 Bilateral primary osteoarthritis of knee: Secondary | ICD-10-CM

## 2022-05-14 DIAGNOSIS — M25561 Pain in right knee: Secondary | ICD-10-CM

## 2022-05-14 DIAGNOSIS — G8929 Other chronic pain: Secondary | ICD-10-CM

## 2022-05-14 DIAGNOSIS — M25562 Pain in left knee: Secondary | ICD-10-CM | POA: Diagnosis not present

## 2022-05-14 MED ORDER — HYALURONAN 88 MG/4ML IX SOSY
176.0000 mg | PREFILLED_SYRINGE | Freq: Once | INTRA_ARTICULAR | Status: AC
  Administered 2022-05-14: 176 mg via INTRA_ARTICULAR

## 2022-05-14 NOTE — Patient Instructions (Signed)
Good to see you Can repeat every 6 months You know where we are in you need it sooner

## 2022-05-14 NOTE — Assessment & Plan Note (Signed)
Viscosupplementation given again today.  Chronic problem with worsening symptoms.  Still wants to avoid any surgical intervention.  Follow-up again in 6 to 8 weeks.

## 2022-05-22 DIAGNOSIS — Z23 Encounter for immunization: Secondary | ICD-10-CM | POA: Diagnosis not present

## 2022-05-28 ENCOUNTER — Ambulatory Visit: Payer: Medicare Other | Admitting: Family Medicine

## 2022-06-14 ENCOUNTER — Other Ambulatory Visit: Payer: Self-pay | Admitting: Family Medicine

## 2022-06-21 DIAGNOSIS — Z23 Encounter for immunization: Secondary | ICD-10-CM | POA: Diagnosis not present

## 2022-06-26 DIAGNOSIS — Z85828 Personal history of other malignant neoplasm of skin: Secondary | ICD-10-CM | POA: Diagnosis not present

## 2022-06-26 DIAGNOSIS — D692 Other nonthrombocytopenic purpura: Secondary | ICD-10-CM | POA: Diagnosis not present

## 2022-06-26 DIAGNOSIS — D485 Neoplasm of uncertain behavior of skin: Secondary | ICD-10-CM | POA: Diagnosis not present

## 2022-06-26 DIAGNOSIS — L820 Inflamed seborrheic keratosis: Secondary | ICD-10-CM | POA: Diagnosis not present

## 2022-06-26 DIAGNOSIS — E669 Obesity, unspecified: Secondary | ICD-10-CM | POA: Diagnosis not present

## 2022-06-28 ENCOUNTER — Other Ambulatory Visit: Payer: Self-pay | Admitting: Family Medicine

## 2022-07-08 ENCOUNTER — Telehealth: Payer: Self-pay | Admitting: Family Medicine

## 2022-07-08 NOTE — Telephone Encounter (Signed)
Pt States: -10/23 was exposed to COVID-19 (2+ hours with person in close quarters). -Has not taken a test yet. -Is not experiencing symptoms -Has not had COVID-19 yet.   Pt acknowledged that follow up is triggered by a positive test or the development of symptoms.   Pt requests: -Call back with information about what to do at this time and if she does or does not test positive for COVID-19.

## 2022-07-09 NOTE — Telephone Encounter (Signed)
From cdc (assuming no covid in last 90 days). Home tess are ok. If she tests positive call for a visit and begin isolation at home (go ahead and isolate even just for symptoms)  Should I stay home? You do not need to stay home unless you develop symptoms.  But, you should get tested on or after July 13, 2022 if you do not develop symptoms. And, you should wear a high-quality mask around other people through July 17, 2022.

## 2022-07-10 NOTE — Telephone Encounter (Signed)
Called and lm on pt vm with below message. 

## 2022-09-18 ENCOUNTER — Encounter: Payer: Self-pay | Admitting: Obstetrics and Gynecology

## 2022-09-18 ENCOUNTER — Ambulatory Visit (INDEPENDENT_AMBULATORY_CARE_PROVIDER_SITE_OTHER): Payer: Medicare Other | Admitting: Obstetrics and Gynecology

## 2022-09-18 VITALS — BP 137/78 | HR 67

## 2022-09-18 DIAGNOSIS — N3281 Overactive bladder: Secondary | ICD-10-CM

## 2022-09-18 DIAGNOSIS — L280 Lichen simplex chronicus: Secondary | ICD-10-CM | POA: Diagnosis not present

## 2022-09-18 DIAGNOSIS — R350 Frequency of micturition: Secondary | ICD-10-CM | POA: Diagnosis not present

## 2022-09-18 MED ORDER — CLOBETASOL PROP EMOLLIENT BASE 0.05 % EX CREA
1.0000 | TOPICAL_CREAM | Freq: Every evening | CUTANEOUS | 11 refills | Status: DC
Start: 2022-09-18 — End: 2023-10-29

## 2022-09-18 NOTE — Progress Notes (Signed)
[facility] Return Visit  SUBJECTIVE  History of Present Illness: Kelly Frazier is a 77 y.o. female seen in follow-up for OAB. Plan at last visit was start [facility] and consider SNM options. She is doing well with the clobetasol cream for her Lichens. She reports she does not have time currently to do the SNM as she knows there would be a recovery period but that she is still considering this for the future.      Past Medical History: Patient  has a past medical history of Allergy, Arthritis, Cancer ([place]) (1980's), Cataract, Diverticulosis, Excessive daytime sleepiness (06/05/2016), GERD (gastroesophageal reflux disease), Head injury due to trauma, Headache(784.0), colonic polyps, Hyperlipidemia, Hypertension, and Thyroid disease.   Past Surgical History: She  has a past surgical history that includes bcca from eyelid; Colonoscopy (08/17/06); Axillary abcess irrigation and debridement; NSVD (1971); Cataract extraction, bilateral; Polypectomy; removal of moles and denmatoid; osseous implant; and Breast biopsy (2006).   Medications: She has a current medication list which includes the following prescription(s): vitamin c with rose hips, atorvastatin, biotin, bisoprolol-hydrochlorothiazide, calcium-magnesium-zinc, vitamin d3, co-enzyme q-10, diclofenac sodium, epinephrine, hydrocortisone, levothyroxine, multiple vitamins-minerals, omeprazole, probiotic, cyanocobalamin, and clobetasol prop emollient base.   Allergies: Patient is allergic to naprosyn [naproxen] and terbinafine hcl.   Social History: Patient  reports that she has never smoked. She has never used smokeless tobacco. She reports current alcohol use of about 2.0 standard drinks of alcohol per week. She reports that she does not use drugs.      OBJECTIVE     Physical Exam: Vitals:   09/18/22 1040  BP: 137/78  Pulse: 67   Gen: No apparent distress, A&O x 3.  Detailed Urogynecologic Evaluation:  Deferred. Prior  exam showed:    ASSESSMENT AND PLAN    Kelly Frazier is a 77 y.o. with:  1. Urinary frequency   2. Overactive bladder   3. Lichen simplex chronicus    1.Considering SNM in the future when her schedule lightens up. She reports she still has the information from when she and Kelly Frazier spoke last visit. We discussed that we are now doing the first stage in office and she reports she will take that into consideration.  2. Patient was on Gemtesa and had some mild relief. She is not eligible for Myrbetriq due to her blood pressure. She is also not a good candidate for anticholinergic medications as she has had memory problems in the past. She is going to forego medication management at this time as the [place] [facility] is too expensive for her to comfortably afford.  3. Patient to continue using Clobetasol cream for Lichens. Refills sent today.

## 2022-09-23 NOTE — Progress Notes (Signed)
[place] Phone: [phone] Subjective:   [name], am serving as a scribe for Dr. Hulan Saas.  I'm seeing this patient by the request  of:  Marin Olp, MD  CC: Foot pain right-sided  [id number]  Kelly Frazier is a 77 y.o. female coming in with complaint of foot pain. Last seen in August for knee pain. Patient states that one month ago she noticed the sensation that she had a bruise on bottom of her heel. Has been rolling her foot out. Pain worse in mornings that improves throughout the day but still remains achy. Hx of plantar fasciitis 16 years ago. Did get orthotics made from podiatrist at that time.       Past Medical History:  Diagnosis Date   Allergy    Arthritis    OA   Cancer ([place]) 1980's   basal cell carcinoma   Cataract    bilateral 2 yrs ago    Diverticulosis    Excessive daytime sleepiness 06/05/2016   GERD (gastroesophageal reflux disease)    Head injury due to trauma    s/p attack - some memory loss    Headache(784.0)    Hx of colonic polyps    Hyperlipidemia    no meds    Hypertension    pt denies- on  meds    Thyroid disease    Past Surgical History:  Procedure Laterality Date   AXILLARY ABCESS IRRIGATION AND DEBRIDEMENT     cyst   bcca from eyelid     basal cell 1980s.    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 1 son. 2 twin grandkids age 70 in 34- in [place].       Retired from [place].       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 handed   Drinks caffeine one story   Social Determinants of Health   Financial Resource Strain: Medium Risk (04/21/2022)   Overall Financial Resource Strain (CARDIA)    Difficulty of Paying Living Expenses: Somewhat hard  Food Insecurity: No Food Insecurity (04/21/2022)   Hunger Vital Sign    Worried About Running Out of Food in the Last Year: Never true    Ran Out of Food in the Last Year: Never true  Transportation Needs: No Transportation Needs (04/21/2022)   PRAPARE -  Hydrologist (Medical): No    Lack of Transportation (Non-Medical): No  Physical Activity: Inactive (04/21/2022)   Exercise Vital Sign    Days of Exercise per Week: 0 days    Minutes of Exercise per Session: 0 min  Stress: No Stress Concern Present (04/21/2022)   [place]    Feeling of Stress : Only a little  Social Connections: Moderately Isolated (04/21/2022)   Social Connection and Isolation Panel [NHANES]    Frequency of Communication with Friends and Family: More than three times a week    Frequency of Social Gatherings with Friends and Family: More than three times a week    Attends Religious Services: Never    Marine scientist or Organizations: Yes    Attends Archivist Meetings: 1 to 4 times per year    Marital Status: Never married   Allergies  Allergen Reactions   Naprosyn [Naproxen] Swelling   Terbinafine Hcl Swelling    Lamisil    Family History  Problem Relation Age of Onset   Cancer Mother         lung- nonsmoker. in her 81s, perhaps related to job   Hypertension Mother    Lung cancer Mother    Liver disease Father    Alcohol abuse Father    Hypertension Father    Ovarian cancer Maternal Grandmother    Hodgkin's lymphoma Maternal Grandfather    Dementia Paternal Grandmother    Tuberculosis Paternal Grandfather    Colon cancer Maternal Aunt    Colon cancer Maternal Uncle    Colon polyps Neg Hx    Esophageal cancer Neg Hx    Rectal cancer Neg Hx    Stomach cancer Neg Hx     Current Outpatient Medications (Endocrine & Metabolic):    levothyroxine (SYNTHROID) 75 MCG tablet, TAKE 1 TABLET BY MOUTH EVERY DAY  Current Outpatient Medications (Cardiovascular):    atorvastatin (LIPITOR) 10 MG tablet, TAKE 1 TABLET BY MOUTH EVERY DAY   bisoprolol-hydrochlorothiazide (ZIAC) 2.5-6.25 MG tablet, Take 1 tablet by mouth daily.   EPINEPHrine (EPIPEN JR) 0.15 MG/0.3ML injection, Inject 0.15 mg into the muscle as needed.    Current Outpatient Medications (Hematological):    vitamin B-12 (CYANOCOBALAMIN) 1000 MCG tablet, Take 1,000 mcg by mouth daily.  Current Outpatient Medications (Other):    Ascorbic Acid (VITAMIN C WITH ROSE HIPS) 500 MG tablet, Take 500 mg by mouth daily.   Biotin 10 MG CHEW, Chew by mouth.   CALCIUM-MAGNESIUM-ZINC PO, Take by mouth.   Cholecalciferol (VITAMIN D3) 10 MCG (400 UNIT) CAPS, Take 10 mcg by mouth.   Clobetasol Prop Emollient Base (CLOBETASOL PROPIONATE E) 0.05 % emollient cream, Apply 1 Application topically at bedtime.   co-enzyme Q-10 30 MG capsule, Take 200 mg by mouth daily.   diclofenac sodium (VOLTAREN) 1 % GEL, Apply topically to affected area qid   hydrocortisone 2.5 % cream, Apply topically in the morning and at bedtime.   Multiple Vitamins-Minerals (WOMENS 50+ ADVANCED PO), Take 2 tablets by mouth daily.   omeprazole (PRILOSEC) 20 MG capsule, TAKE 1-2 CAPSULES BY MOUTH EVERY DAY   Saccharomyces boulardii (PROBIOTIC) 250 MG CAPS, Take by  mouth.   Reviewed prior external information including notes and imaging from  primary care provider As well as notes that were available from care everywhere and other healthcare systems.  Past medical history, social, surgical and family history all reviewed in electronic  medical record.  No pertanent information unless stated regarding to the chief complaint.   Review of Systems:  No headache, visual changes, nausea, vomiting, diarrhea, constipation, dizziness, abdominal pain, skin rash, fevers, chills, night sweats, weight loss, swollen lymph nodes, body aches, joint swelling, chest pain, shortness of breath, mood changes. POSITIVE muscle aches  Objective  Blood pressure 122/84, pulse 62, height '5\' 2"'$  (1.575 m), weight 179 lb (81.2 kg), SpO2 98 %.   General: No apparent distress alert and oriented x3 mood and affect normal, dressed appropriately.  HEENT: Pupils equal, extraocular movements intact  Respiratory: Patient's speak in full sentences and does not appear short of breath  Cardiovascular: No lower extremity edema, non tender, no erythema  Right foot exam shows the patient does have a breakdown of the longitudinal arch noted.  The patient does have some tenderness to palpation over the medial calcaneal area.   Limited muscular skeletal ultrasound was performed and interpreted by Hulan Saas, M   Limited ultrasound shows the patient does have some mild thickening noted of the plantar fascia no cortical deformity  Impression: mild plantar fascitis   [id number]; 15 additional minutes spent for Therapeutic exercises as stated in above notes.  This included exercises focusing on stretching, strengthening, with significant focus on eccentric aspects.   Long term goals include an improvement in range of motion, strength, endurance as well as avoiding reinjury. Patient's frequency would include in 1-2 times a day, 3-5 times a week for a duration of 6-12 weeks.  Exercises for the foot  include:  Stretches to help lengthen the lower leg and plantar fascia areas Theraband exercises for the lower leg and ankle to help strengthen the surrounding area- dorsiflexion, plantarflexion, inversion, eversion Massage rolling on the plantar surface of the foot with a frozen bottle, tennis ball or golf ball Towel or marble pick-ups to strengthen the plantar surface of the foot Weight bearing exercises to increase balance and overall stability  Proper technique shown and discussed handout in great detail with ATC.  All questions were discussed and answered.      Impression and Recommendations:    The above documentation has been reviewed and is accurate and complete Lyndal Pulley, DO

## 2022-09-26 ENCOUNTER — Encounter: Payer: Self-pay | Admitting: Family Medicine

## 2022-09-26 ENCOUNTER — Ambulatory Visit (INDEPENDENT_AMBULATORY_CARE_PROVIDER_SITE_OTHER): Payer: Medicare Other | Admitting: Family Medicine

## 2022-09-26 ENCOUNTER — Ambulatory Visit: Payer: Medicare Other

## 2022-09-26 VITALS — BP 122/84 | HR 62 | Ht 62.0 in | Wt 179.0 lb

## 2022-09-26 DIAGNOSIS — M722 Plantar fascial fibromatosis: Secondary | ICD-10-CM | POA: Diagnosis not present

## 2022-09-26 DIAGNOSIS — M79672 Pain in left foot: Secondary | ICD-10-CM | POA: Diagnosis not present

## 2022-09-26 NOTE — Assessment & Plan Note (Signed)
We reviewed that stretching is critically important to the treatment of PF.  Reviewed footwear. Rigid soles have been shown to help with PF. Night splints can sometimes help. Reviewed rehab of stretching and calf raises.   Could benefit from a corticosteroid injection, orthotics, or other measures if conservative treatment fails. Discussed with patient about proper shoes and avoiding being barefoot will follow-up with me again in 6 weeks

## 2022-09-26 NOTE — Patient Instructions (Signed)
Wear supportive shoe in house See me in 6 weeks

## 2022-09-30 ENCOUNTER — Encounter: Payer: Self-pay | Admitting: Gastroenterology

## 2022-10-15 ENCOUNTER — Other Ambulatory Visit: Payer: Self-pay | Admitting: Family Medicine

## 2022-11-03 ENCOUNTER — Encounter: Payer: Self-pay | Admitting: *Deleted
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
## 2022-11-13 ENCOUNTER — Ambulatory Visit (INDEPENDENT_AMBULATORY_CARE_PROVIDER_SITE_OTHER): Payer: Medicare Other | Admitting: Family Medicine

## 2022-11-13 VITALS — BP 124/82 | HR 64 | Ht 62.0 in | Wt 174.0 lb

## 2022-11-13 DIAGNOSIS — M170 Bilateral primary osteoarthritis of knee: Secondary | ICD-10-CM

## 2022-11-13 DIAGNOSIS — M722 Plantar fascial fibromatosis: Secondary | ICD-10-CM | POA: Diagnosis not present

## 2022-11-13 NOTE — Assessment & Plan Note (Signed)
Improvement noted with the home exercises and better shoes. No changes in management

## 2022-11-13 NOTE — Assessment & Plan Note (Signed)
At this moment we do not have the approval for the viscosupplementation though we will try to get it again.  Discussed with patient to continue to work on the home exercises, icing regimen, patient was busy because of his boating season, encouraged her to continue to work on her weight but she has lost 5 pounds since previous exam.  This will also help her knees and foot.  Follow-up with me again as stated as soon as we know if viscosupplementation is an option or worsening pain.

## 2022-11-13 NOTE — Patient Instructions (Signed)
We will send you a MyChart message when we get approved
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
## 2022-11-21 ENCOUNTER — Ambulatory Visit (INDEPENDENT_AMBULATORY_CARE_PROVIDER_SITE_OTHER): Payer: Medicare Other | Admitting: Family Medicine

## 2022-11-21 ENCOUNTER — Other Ambulatory Visit: Payer: Self-pay | Admitting: Family Medicine

## 2022-11-21 ENCOUNTER — Encounter: Payer: Self-pay | Admitting: Family Medicine

## 2022-11-21 VITALS — BP 112/78 | HR 65 | Ht 62.0 in | Wt 174.0 lb

## 2022-11-21 DIAGNOSIS — M7062 Trochanteric bursitis, left hip: Secondary | ICD-10-CM

## 2022-11-21 DIAGNOSIS — M170 Bilateral primary osteoarthritis of knee: Secondary | ICD-10-CM

## 2022-11-21 MED ORDER — HYALURONAN 88 MG/4ML IX SOSY
176.0000 mg | PREFILLED_SYRINGE | Freq: Once | INTRA_ARTICULAR | Status: AC
  Administered 2022-11-21: 176 mg via INTRA_ARTICULAR

## 2022-11-21 NOTE — Assessment & Plan Note (Signed)
Chronic problem with worsening symptoms, and continuing to try to avoid surgical intervention.  Has responded to viscosupplementation in the past and hopeful that this will happen again.  Discussed icing regimen and home exercises.  Follow-up with me again in 6 months otherwise

## 2022-11-21 NOTE — Assessment & Plan Note (Signed)
Injection given today and tolerated the procedure well, discussed icing regimen and home exercises, increase activity slowly over the course of next several weeks.  Follow-up with me again in 6 to 8 weeks if this problem does not really resolve

## 2022-11-21 NOTE — Patient Instructions (Signed)
Monovisc for both knees Steroid for GT pain See me again in

## 2022-12-23 ENCOUNTER — Encounter: Payer: Self-pay | Admitting: Family Medicine

## 2022-12-23 ENCOUNTER — Ambulatory Visit (INDEPENDENT_AMBULATORY_CARE_PROVIDER_SITE_OTHER): Payer: Medicare Other | Admitting: Family Medicine

## 2022-12-23 ENCOUNTER — Ambulatory Visit (INDEPENDENT_AMBULATORY_CARE_PROVIDER_SITE_OTHER): Payer: Medicare Other

## 2022-12-23 VITALS — BP 120/82 | HR 60 | Ht 62.0 in | Wt 174.0 lb

## 2022-12-23 DIAGNOSIS — M25512 Pain in left shoulder: Secondary | ICD-10-CM

## 2022-12-23 DIAGNOSIS — S46202A Unspecified injury of muscle, fascia and tendon of other parts of biceps, left arm, initial encounter: Secondary | ICD-10-CM

## 2022-12-23 DIAGNOSIS — M19012 Primary osteoarthritis, left shoulder: Secondary | ICD-10-CM | POA: Diagnosis not present

## 2022-12-23 DIAGNOSIS — S46209A Unspecified injury of muscle, fascia and tendon of other parts of biceps, unspecified arm, initial encounter: Secondary | ICD-10-CM

## 2022-12-23 NOTE — Progress Notes (Signed)
Tawana ScaleZach Mariellen Blaney D.O. [facility] [address] [place] [id number] Phone: [phone] Subjective:   Kelly Frazier, Kelly Frazier, am serving as a scribe for Dr. Antoine PrimasZachary Edyth Glomb.  I'm seeing this patient by the request  of:  Shelva MajesticHunter, Stephen O, MD  CC: shoulder pain   BJY:NWGNFAOZHYHPI:Subjective  Kelly ElliotCarolyn W Frazier is a 77 y.o. female coming in with complaint of shoulder popped last night now having shoulder pain that radiates to the elbow. Patient states that she was not doing anything strenuous but she heard a pop in the left shoulder it felt like a rubber band. Patient pain to the top of left shoulder. Patient tried heat last night no ice because she was cold and took ibuprofen this morning.       Past Medical History:  Diagnosis Date   Allergy    Arthritis    OA   Cancer (HCC) 1980's   basal cell carcinoma   Cataract    bilateral 2 yrs ago    Diverticulosis    Excessive daytime sleepiness 06/05/2016   GERD (gastroesophageal reflux disease)    Head injury due to trauma    s/p attack - some memory loss    Headache(784.0)    Hx of colonic polyps    Hyperlipidemia    no meds    Hypertension    pt denies- on  meds    Thyroid disease    Past Surgical History:  Procedure Laterality Date   AXILLARY ABCESS IRRIGATION AND DEBRIDEMENT     cyst   bcca from eyelid     basal cell 1980s.    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 1 son. 2 twin grandkids age 106 in 652015- in [place].       Retired from [employer].       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    Left handed   Drinks caffeine one story   Social Determinants of Health   Financial Resource Strain: Medium Risk (04/21/2022)   Overall Financial Resource Strain (CARDIA)    Difficulty of Paying Living Expenses: Somewhat hard  Food Insecurity: No Food Insecurity (04/21/2022)   Hunger Vital Sign    Worried About Running Out of Food in the Last Year: Never true    Ran Out of Food in the Last Year: Never true  Transportation Needs: No Transportation Needs (04/21/2022)   PRAPARE - Transportation  Lack of Transportation (Medical): No    Lack of Transportation (Non-Medical): No  Physical Activity: Inactive (04/21/2022)   Exercise Vital Sign    Days of Exercise per Week: 0 days    Minutes of Exercise per Session: 0 min  Stress: No Stress Concern Present (04/21/2022)   [employer] of Occupational Health - Occupational Stress Questionnaire    Feeling of Stress : Only a little  Social Connections: Moderately Isolated (04/21/2022)   Social Connection and Isolation Panel [NHANES]    Frequency of Communication with Friends and Family: More than three times a week    Frequency of Social Gatherings with Friends and Family: More than three times a week    Attends Religious Services: Never    Database administrator or Organizations: Yes    Attends Banker Meetings: 1 to 4 times per year    Marital Status: Never married   Allergies  Allergen Reactions   Naprosyn [Naproxen] Swelling   Terbinafine Hcl Swelling    Lamisil    Family History  Problem Relation Age of Onset   Cancer Mother        lung- nonsmoker.  in her 70s, perhaps related to job   Hypertension Mother    Lung cancer Mother    Liver disease Father    Alcohol abuse Father    Hypertension Father    Ovarian cancer Maternal Grandmother    Hodgkin's lymphoma Maternal Grandfather    Dementia Paternal Grandmother    Tuberculosis Paternal Grandfather    Colon cancer Maternal Aunt    Colon cancer Maternal Uncle    Colon polyps Neg Hx    Esophageal cancer Neg Hx    Rectal cancer Neg Hx    Stomach cancer Neg Hx     Current Outpatient Medications (Endocrine & Metabolic):    levothyroxine (SYNTHROID) 75 MCG tablet, TAKE 1 TABLET BY MOUTH EVERY DAY  Current Outpatient Medications (Cardiovascular):    atorvastatin (LIPITOR) 10 MG tablet, TAKE 1 TABLET BY MOUTH EVERY DAY   bisoprolol-hydrochlorothiazide (ZIAC) 2.5-6.25 MG tablet, TAKE 1 TABLET BY MOUTH EVERY DAY   EPINEPHrine (EPIPEN JR) 0.15 MG/0.3ML injection, Inject 0.15 mg into the muscle as needed.    Current Outpatient Medications (Hematological):    vitamin B-12 (CYANOCOBALAMIN) 1000 MCG tablet, Take 1,000 mcg by mouth daily.  Current Outpatient Medications (Other):    Ascorbic Acid (VITAMIN C WITH ROSE HIPS) 500 MG tablet, Take 500 mg by mouth daily.   Biotin 10 MG CHEW, Chew by mouth.   CALCIUM-MAGNESIUM-ZINC PO, Take by mouth.   Cholecalciferol (VITAMIN D3) 10 MCG (400 UNIT) CAPS, Take 10 mcg by mouth.   Clobetasol Prop Emollient Base (CLOBETASOL PROPIONATE E) 0.05 % emollient cream, Apply 1 Application topically at bedtime.   co-enzyme Q-10 30 MG capsule, Take 200 mg by mouth daily.   diclofenac sodium (VOLTAREN) 1 % GEL, Apply topically to affected area qid   hydrocortisone 2.5 % cream, Apply topically in the morning and at bedtime.   Multiple Vitamins-Minerals (WOMENS 50+ ADVANCED PO), Take 2 tablets by mouth daily.   omeprazole (PRILOSEC) 20 MG capsule, TAKE 1-2 CAPSULES BY MOUTH EVERY DAY   Saccharomyces boulardii (PROBIOTIC) 250 MG CAPS, Take by mouth.   Reviewed  prior external information including notes and imaging from  primary care provider As well as notes that were available from care everywhere and other healthcare systems.  Past medical history, social, surgical and family history all reviewed in electronic medical record.  No pertanent information unless stated regarding to the chief complaint.   Review of Systems:  No headache, visual changes, nausea, vomiting, diarrhea, constipation, dizziness, abdominal pain, skin rash, fevers, chills, night sweats, weight loss, swollen lymph nodes, body aches, joint swelling, chest pain, shortness of breath, mood changes. POSITIVE muscle aches  Objective  Blood pressure 120/82, pulse 60, height 5\' 2"  (1.575 m), weight 174 lb (78.9 kg), SpO2 95 %.   General: No apparent distress alert and oriented x3 mood and affect normal, dressed appropriately.  HEENT: Pupils equal, extraocular movements intact  Respiratory: Patient's speak in full sentences and does not appear short of breath  Cardiovascular: No lower extremity edema, non tender, no erythema  Left shoulder positive speeds positive impingement tender to palpation on the anterior aspect of the bicep tendon.  Good strength still noted of the rotator cuff.  Mild pain over the acromioclavicular joint.  [id number]; 15 additional minutes spent for Therapeutic exercises as stated in above notes.  This included exercises focusing on stretching, strengthening, with significant focus on eccentric aspects.   Long term goals include an improvement in range of motion, strength, endurance as well as avoiding reinjury. Patient's frequency would include in 1-2 times a day, 3-5 times a week for a duration of 6-12 weeks. Shoulder Exercises that included:  Basic scapular stabilization to include adduction and depression of scapula Scaption, focusing on proper movement and good control Internal and External rotation utilizing a theraband, with elbow tucked at side entire time Rows  with theraband    Proper technique shown and discussed handout in great detail with ATC.  All questions were discussed and answered.      Impression and Recommendations:     The above documentation has been reviewed and is accurate and complete Judi Saa, DO

## 2022-12-23 NOTE — Patient Instructions (Addendum)
Arm compress daily for 2 weeks Exercise don't start until Monday Ice every 4 hours while awake for next 72 hours See you again in 4 weeks

## 2022-12-23 NOTE — Assessment & Plan Note (Signed)
Will need to monitor with patient having difficulty with this in the past and we started compensating for the bicep tendon.

## 2022-12-23 NOTE — Assessment & Plan Note (Signed)
Injury noted.  Patient likely has a partial tear noted.  Held on any type of imaging today.  Discussed the potential for x-rays, ultrasound and formal physical therapy.  Patient will start with home exercises and compression sleeve.  Worsening pain to come see [place] sooner.  Patient does not have chest pain and not concerning for any other abnormality that would be too concerning at the moment.  Follow-up again in 6 to 8 weeks

## 2023-01-13 ENCOUNTER — Ambulatory Visit (INDEPENDENT_AMBULATORY_CARE_PROVIDER_SITE_OTHER): Payer: Medicare Other | Admitting: Family Medicine

## 2023-01-13 ENCOUNTER — Other Ambulatory Visit: Payer: Self-pay | Admitting: Family Medicine

## 2023-01-13 VITALS — BP 128/82 | HR 70 | Temp 97.2°F | Ht 62.0 in | Wt 179.6 lb

## 2023-01-13 DIAGNOSIS — R1032 Left lower quadrant pain: Secondary | ICD-10-CM | POA: Diagnosis not present

## 2023-01-13 DIAGNOSIS — I700 Atherosclerosis of aorta: Secondary | ICD-10-CM | POA: Diagnosis not present

## 2023-01-13 DIAGNOSIS — R739 Hyperglycemia, unspecified: Secondary | ICD-10-CM

## 2023-01-13 DIAGNOSIS — E039 Hypothyroidism, unspecified: Secondary | ICD-10-CM | POA: Diagnosis not present

## 2023-01-13 DIAGNOSIS — E785 Hyperlipidemia, unspecified: Secondary | ICD-10-CM

## 2023-01-13 DIAGNOSIS — R1310 Dysphagia, unspecified: Secondary | ICD-10-CM

## 2023-01-13 DIAGNOSIS — Z86010 Personal history of colonic polyps: Secondary | ICD-10-CM | POA: Diagnosis not present

## 2023-01-13 DIAGNOSIS — R635 Abnormal weight gain: Secondary | ICD-10-CM | POA: Diagnosis not present

## 2023-01-13 DIAGNOSIS — Z131 Encounter for screening for diabetes mellitus: Secondary | ICD-10-CM | POA: Diagnosis not present

## 2023-01-13 DIAGNOSIS — E538 Deficiency of other specified B group vitamins: Secondary | ICD-10-CM | POA: Diagnosis not present

## 2023-01-13 MED ORDER — BISOPROLOL-HYDROCHLOROTHIAZIDE 2.5-6.25 MG PO TABS: 2.0000 | ORAL_TABLET | Freq: Every day | ORAL | 0 refills | Status: DC

## 2023-01-13 NOTE — Progress Notes (Signed)
Phone [phone] In person visit   Subjective:   Kelly Frazier is a 77 y.o. year old very pleasant female patient who presents for/with See problem oriented charting Chief Complaint  Patient presents with   Referral    Pt would like gi referral   Abdominal Pain    Pt c/o llq pain with yellow stool x3-[redacted] weeks along with being bloated and excess flatulence. Denies n/v just feels achy through abdominal area an heating pad hleps some    Past Medical History-  Patient Active Problem List   Diagnosis Date Noted   Aortic atherosclerosis (HCC) 04/27/2021    Priority: Medium    Vitamin B 12 deficiency 09/22/2018    Priority: Medium    Solitary pulmonary nodule 01/10/2017    Priority: Medium    Migraine 08/17/2014    Priority: Medium    Hyperlipidemia 05/03/2007    Priority: Medium    Essential hypertension 05/03/2007    Priority: Medium    Hypothyroidism 04/12/2007    Priority: Medium    Degenerative arthritis of knee, bilateral 02/18/2019    Priority: Low   Arthralgia of both knees 11/13/2017    Priority: Low   Trigger finger, left 08/03/2017    Priority: Low   Esophageal reflux 09/03/2010    Priority: Low   Angioedema 06/21/2010    Priority: Low   History of colonic polyps 09/27/2007    Priority: Low   Osteoarthritis 04/12/2007    Priority: Low   Injury of tendon of biceps 12/23/2022    Priority: 1.   Greater trochanteric bursitis of left hip 11/21/2022    Priority: 1.   Plantar fasciitis 09/26/2022    Priority: 1.   Arthritis of carpometacarpal [facility]) joint of both thumbs 10/28/2021    Priority: 1.   Left shoulder pain 02/06/2021    Priority: 1.   AC (acromioclavicular) arthritis 02/06/2021    Priority: 1.   Hematoma of left hip 12/26/2020    Priority: 1.   Snoring 06/05/2016   Excessive daytime sleepiness 06/05/2016   Shortness of breath 03/11/2016    Medications- reviewed and updated Current Outpatient Medications  Medication Sig Dispense Refill    Ascorbic Acid (VITAMIN C WITH ROSE HIPS) 500 MG tablet Take 500 mg by mouth daily.     atorvastatin (LIPITOR) 10 MG tablet TAKE 1 TABLET BY MOUTH EVERY DAY 90 tablet 2   Biotin 10 MG CHEW Chew by mouth.     CALCIUM-MAGNESIUM-ZINC PO Take by mouth.     Cholecalciferol (VITAMIN D3) 10 MCG (400 UNIT) CAPS Take 10 mcg by mouth.     Clobetasol Prop Emollient Base (CLOBETASOL PROPIONATE E) 0.05 % emollient cream Apply 1 Application topically at bedtime. 30 g 11   co-enzyme Q-10 30 MG capsule Take 200 mg by mouth daily.     diclofenac sodium (VOLTAREN) 1 % GEL Apply topically to affected area qid 100 g 1   EPINEPHrine (EPIPEN JR) 0.15 MG/0.3ML injection Inject 0.15 mg into the muscle as needed.     hydrocortisone 2.5 % cream Apply topically in the morning and at bedtime.     levothyroxine (SYNTHROID) 75 MCG tablet TAKE 1 TABLET BY MOUTH EVERY DAY 90 tablet 2   Multiple Vitamins-Minerals (WOMENS 50+ ADVANCED PO) Take 2 tablets by mouth daily.     omeprazole (PRILOSEC) 20 MG capsule TAKE 1-2 CAPSULES BY MOUTH EVERY DAY 180 capsule 3   Saccharomyces boulardii (PROBIOTIC) 250 MG CAPS Take by mouth.     vitamin  B-12 (CYANOCOBALAMIN) 1000 MCG tablet Take 1,000 mcg by mouth daily.     bisoprolol-hydrochlorothiazide (ZIAC) 2.5-6.25 MG tablet Take 2 tablets by mouth daily. Has extra at home- is taking 2 until she runs out and then we can send in new rx 1 tablet 0   No current facility-administered medications for this visit.     Objective:  BP 128/82   Pulse 70   Temp (!) 97.2 F (36.2 C)   Ht 5\' 2"  (1.575 m)   Wt 179 lb 9.6 oz (81.5 kg)   SpO2 97%   BMI 32.85 kg/m  Gen: NAD, resting comfortably CV: RRR no murmurs rubs or gallops Lungs: CTAB no crackles, wheeze, rhonchi Abdomen: soft but diffusely tender mildly over her entire abdomen-mild to moderate pain with deep palpation left lower quadrant-very slight rebound reported left lower quadrant Ext: no edema Skin: warm, dry     Assessment and  Plan   # Bloating/LLQ pain/weight gain/change in stool habits S: Has noted about 10 lbs up in a month without change of exercise or diet.  Also has sensation of being bloated as well as excess flatulence.  No nausea or vomiting reported. Usually has 2 bowel movements each day but up 4 a day right now. This is associated with yellow stool for 3 to 4 weeks- also diarrhea this morning on 4th bowel movement this morning. More fatigue also noted- had cold that ended around christmas week but just never felt like she bounced back. Also for 3-4 months sensation of something getting stuck in esophagus- sometimes even swallowing doesn't feel great- warm water works better. No bright red blood per rectum or melena.   Patient reports left lower quadrant pain up to a 6/10 pain  for a week but around 2-3/10 right now.  Also has achiness throughout the abdomen-heating pad helps some.   -Of note patient's last colonoscopy was 06/29/2017 with history of sessile serrated polyp and plan for 5-year follow-up-she was mailed a letter in January to call to schedule colonoscopy with Dr. Russella Dar A/P: 77 year old female with 10 pound weight gain, bloating, change in stool habits over the last month along with dysphagia - Update blood work as below - Referred to GI for their opinion on endoscopy and she is certainly due for colonoscopy -We discussed if it takes an extended period of time to get into GI but I would like to consider CT of the abdomen pelvis primarily to rule out mass though no palpable abnormality on exam today -Diverticulitis would also be in the differential but would not explain weight gain issues and left lower quadrant pain is not substantial on exam though does report mild rebound -No urinary symptoms but added a urine (may have been added after she was in lab)  #hyperlipidemia/aortic atherosclerosis-LDL goal under 70 S: Medication: Atorvastatin 10 mg Lab Results  Component Value Date   CHOL 134  01/04/2021   HDL 43 (L) 01/04/2021   LDLCALC 74 01/04/2021   LDLDIRECT 88.0 10/18/2021   TRIG 91 01/04/2021   CHOLHDL 3.1 01/04/2021   A/P: Not recently checked but has been close to ideal goal of 70 or less in the past-update with lipid panel today  #hypothyroidism S: compliant On thyroid medication- Synthroid   Lab Results  Component Value Date   TSH 1.76 10/18/2021  A/P: Hopefuly stable or improved- update TSH with labs today. Continue current meds for now.  #hypertension S: medication: Ziac 2.5-6.25Mg  - takes two each day Home readings #s:  no recent checks BP Readings from Last 3 Encounters:  01/13/23 128/82  12/23/22 120/82  11/21/22 112/78  A/P: Controlled. Continue current medications.  # B12 deficiency S: Current treatment/medication (oral vs. IM): Vitamin b12 Lab Results  Component Value Date   VITAMINB12 858 10/18/2021  A/P: Hopefuly stable  update B12 with labs today. Continue current meds for now.  Could be a sign of malabsorption if level is low  # Hyperglycemia/insulin resistance/prediabetes S:  Medication: None Lab Results  Component Value Date   HGBA1C 5.7 02/21/2016    A/P: Noted prior prediabetes on review of labs today-update A1c with labs   Recommended follow up: Return for as needed for new, worsening, persistent symptoms. Future Appointments  Date Time Provider Department Center  01/21/2023 10:00 AM Judi Saa, DO [facility] None  02/25/2023 12:45 PM Judi Saa, DO [facility] None  04/27/2023  3:15 PM [facility] ANNUAL WELLNESS VISIT 1 [facility]    Lab/Order associations: last meal 10 am- labs 6.5 hours after   ICD-10-CM   1. LLQ pain  R10.32 Ambulatory referral to Gastroenterology    Comprehensive metabolic panel    CBC with Differential/Platelet    Urinalysis, Routine w reflex microscopic    2. Weight gain  R63.5 TSH    3. Dysphagia, unspecified type  R13.10 Ambulatory referral to Gastroenterology    4. History of  colon polyps  Z86.010 Ambulatory referral to Gastroenterology    5. Aortic atherosclerosis (HCC)  I70.0     6. Hypothyroidism, unspecified type  E03.9     7. Hyperlipidemia, unspecified hyperlipidemia type  E78.5 Lipid panel    8. Vitamin B 12 deficiency  E53.8 Vitamin B12    9. Screening for diabetes mellitus  Z13.1 HgB A1c    10. Hyperglycemia  R73.9 HgB A1c      Meds ordered this encounter  Medications   bisoprolol-hydrochlorothiazide (ZIAC) 2.5-6.25 MG tablet    Sig: Take 2 tablets by mouth daily. Has extra at home- is taking 2 until she runs out and then we can send in new rx    Dispense:  1 tablet    Refill:  0    Return precautions advised.  Tana Conch, MD

## 2023-01-13 NOTE — Patient Instructions (Addendum)
[facility] contact Please call to schedule visit and/or procedure Address: [address] [place], [place], [place] [address] Phone: [phone]   If it takes extended period of time to get into gastroenterology lets also consider CT of abdomen and pelvis. New or worsening symptoms please let me know as soon as possible   Get Mammogram scheduled  Please stop by lab before you go If you have mychart- we will send your results within 3 business days of [place] receiving them.  If you do not have mychart- we will call you about results within 5 business days of [place] receiving them.  *please also note that you will see labs on mychart as soon as they post. I will later go in and write notes on them- will say "notes from Dr. Durene Cal"

## 2023-01-14 ENCOUNTER — Ambulatory Visit (HOSPITAL_BASED_OUTPATIENT_CLINIC_OR_DEPARTMENT_OTHER)
Admission: RE | Admit: 2023-01-14 | Discharge: 2023-01-14 | Disposition: A | Discharge status: Home / Self Care | Payer: Medicare Other | Source: Ambulatory Visit | Attending: Family Medicine | Admitting: Family Medicine

## 2023-01-14 ENCOUNTER — Other Ambulatory Visit: Payer: Self-pay | Admitting: Family Medicine

## 2023-01-14 ENCOUNTER — Encounter: Payer: Self-pay | Admitting: Family Medicine

## 2023-01-14 ENCOUNTER — Other Ambulatory Visit (HOSPITAL_BASED_OUTPATIENT_CLINIC_OR_DEPARTMENT_OTHER): Payer: Self-pay | Admitting: Family Medicine

## 2023-01-14 ENCOUNTER — Telehealth: Payer: Self-pay | Admitting: Family Medicine

## 2023-01-14 ENCOUNTER — Encounter: Payer: Self-pay | Admitting: Nurse Practitioner

## 2023-01-14 DIAGNOSIS — R1032 Left lower quadrant pain: Secondary | ICD-10-CM | POA: Diagnosis not present

## 2023-01-14 DIAGNOSIS — D72829 Elevated white blood cell count, unspecified: Secondary | ICD-10-CM | POA: Diagnosis not present

## 2023-01-14 DIAGNOSIS — Z1231 Encounter for screening mammogram for malignant neoplasm of breast: Secondary | ICD-10-CM

## 2023-01-14 LAB — COMPREHENSIVE METABOLIC PANEL
ALT: 22 U/L (ref 0–35)
AST: 24 U/L (ref 0–37)
Albumin: 4.1 g/dL (ref 3.5–5.2)
Alkaline Phosphatase: 69 U/L (ref 39–117)
BUN: 12 mg/dL (ref 6–23)
CO2: 26 mEq/L (ref 19–32)
Calcium: 9.3 mg/dL (ref 8.4–10.5)
Chloride: 103 mEq/L (ref 96–112)
Creatinine, Ser: 0.74 mg/dL (ref 0.40–1.20)
GFR: 79.25 mL/min (ref >60.00)
Glucose, Bld: 78 mg/dL (ref 70–99)
Potassium: 3.8 mEq/L (ref 3.5–5.1)
Sodium: 138 mEq/L (ref 135–145)
Total Bilirubin: 0.4 mg/dL (ref 0.2–1.2)
Total Protein: 7.1 g/dL (ref 6.0–8.3)

## 2023-01-14 LAB — CBC WITH DIFFERENTIAL/PLATELET
Basophils Absolute: 0.1 10*3/uL (ref 0.0–0.1)
Basophils Relative: 0.7 % (ref 0.0–3.0)
Eosinophils Absolute: 0.1 10*3/uL (ref 0.0–0.7)
Eosinophils Relative: 1 % (ref 0.0–5.0)
HCT: 42.8 % (ref 36.0–46.0)
Hemoglobin: 14.4 g/dL (ref 12.0–15.0)
Lymphocytes Relative: 19.3 % (ref 12.0–46.0)
Lymphs Abs: 2.7 10*3/uL (ref 0.7–4.0)
MCHC: 33.6 g/dL (ref 30.0–36.0)
MCV: 90.6 fl (ref 78.0–100.0)
Monocytes Absolute: 0.6 10*3/uL (ref 0.1–1.0)
Monocytes Relative: 4.3 % (ref 3.0–12.0)
Neutro Abs: 10.6 10*3/uL — ABNORMAL HIGH (ref 1.4–7.7)
Neutrophils Relative %: 74.7 % (ref 43.0–77.0)
Platelets: 294 10*3/uL (ref 150.0–400.0)
RBC: 4.72 Mil/uL (ref 3.87–5.11)
RDW: 14.9 % (ref 11.5–15.5)
WBC: 14.2 10*3/uL — ABNORMAL HIGH (ref 4.0–10.5)

## 2023-01-14 LAB — VITAMIN B12: Vitamin B-12: 1396 pg/mL — ABNORMAL HIGH (ref 211–911)

## 2023-01-14 LAB — LIPID PANEL
Cholesterol: 133 mg/dL (ref 0–200)
HDL: 44.6 mg/dL (ref >39.00)
LDL Cholesterol: 70 mg/dL (ref 0–99)
NonHDL: 88.31
Total CHOL/HDL Ratio: 3
Triglycerides: 91 mg/dL (ref 0.0–149.0)
VLDL: 18.2 mg/dL (ref 0.0–40.0)

## 2023-01-14 LAB — TSH: TSH: 1.16 u[IU]/mL (ref 0.35–5.50)

## 2023-01-14 LAB — HEMOGLOBIN A1C: Hgb A1c MFr Bld: 5.8 % (ref 4.6–6.5)

## 2023-01-14 MED ORDER — IOHEXOL 300 MG/ML  SOLN
100.0000 mL | Freq: Once | INTRAMUSCULAR | Status: AC | PRN
  Administered 2023-01-14: 100 mL via INTRAVENOUS

## 2023-01-14 NOTE — Telephone Encounter (Signed)
FYI

## 2023-01-14 NOTE — Telephone Encounter (Signed)
Patient states that PCP informed her to let him know if symptoms worsen.   Patient states:  - Now experiencing sharp pains that are traveling from LLQ to the right side and under left breast  - Pain kept her awake all night  - Gastroenterology stated she would need to see either Dr. Russella Dar or their PA prior to deciding if needed. Next opening 03/24/23.   Due to worsening symptoms pt has been transferred to triage.

## 2023-01-14 NOTE — Telephone Encounter (Signed)
Final outcome: Call PCP Now.  Patient Name: Kelly Frazier Gender: Female DOB: Jan 24, 1946 Age: 77 Y 2 M 22 D Return Phone Number: [phone] (Primary) Address: City/ State/ Zip: [place]  [address] Client [facility] - [facility], [facility] Provider Tana Conch- MD Contact Type Call Who Is Calling Patient / Member / Family / Caregiver Call Type Triage / Clinical Relationship To Patient Self Return Phone Number [phone] (Primary) Chief Complaint SEVERE ABDOMINAL PAIN - Severe pain in abdomen Reason for Call Symptomatic / Request for Health Information Initial Comment Office transferred caller. Caller states she was seen yesterday for lower left quadrant pain and was advised to call back today if still having pain. She is still having the pain and its going around to her upper and lower abdominal pain with some stabbing pain under her left breast. She had a bowel movement that looked like jelly and it wasnt very much. Additional Comment She has been using a heating pad. Translation No Nurse Assessment Nurse: [place], RN, Marchelle Folks Date/Time (Eastern Time): 01/14/2023 11:29:03 AM Confirm and document reason for call. If symptomatic, describe symptoms. ---Caller states she was seen yesterday for lower left quadrant pain. was told to call GI and get appt. was told if it took too long to get in she would need to have a CT. still having pain today and now worse. was the LLQ. going on for a couple weeks. yesterday was worse. last night she at a banana and apple sauce and could not sleep due to the pain. has spread to the right side and had stabbing pain under the left breast. no BM this morning but a little mucus discharge Does the patient have any new or worsening symptoms? ---Yes Will a triage be completed? ---Yes Related visit to physician within the last 2 weeks? ---Yes Does the PT have  any chronic conditions? (i.e. diabetes, asthma, this includes High risk factors for pregnancy, etc.) ---Yes List chronic conditions. ---OA, incontinence, hemangioma PLEASE NOTE: All timestamps contained within this report are represented as [place] Standard Time. CONFIDENTIALTY NOTICE: This fax transmission is intended only for the addressee. It contains information that is legally privileged, confidential or otherwise protected from use or disclosure. If you are not the intended recipient, you are strictly prohibited from reviewing, disclosing, copying using or disseminating any of this information or taking any action in reliance on or regarding this information. If you have received this fax in error, please notify us immediately by telephone so that we can arrange for its return to [place]. Phone: [phone], Toll-Free: [phone], Fax: [phone] Page: 2 of 2 Call Id: [id number] Nurse Assessment Is this a behavioral health or substance abuse call? ---No Guidelines Guideline Title Affirmed Question Affirmed Notes Nurse Date/Time Lamount Cohen Time) Recent Medical Visit for Illness Follow-up Call [1] Recent medical visit within 24 hours AND [2] condition / symptoms WORSE Humfleet, RN, Marchelle Folks 01/14/2023 11:32:45 AM Disp. Time Lamount Cohen Time) Disposition Final User 01/14/2023 11:27:00 AM Send to Urgent Queue Baker Pierini 01/14/2023 11:40:14 AM Call PCP Now Yes Humfleet, RN, Marchelle Folks Final Disposition 01/14/2023 11:40:14 AM Call PCP Now Yes Humfleet, RN, Earnestine Leys Disagree/Comply Comply Caller Understands Yes PreDisposition Did not know what to do Care Advice Given Per Guideline CALL PCP NOW: * You need to discuss this with your doctor (or NP/PA). * I'll page the on-call provider now. If you haven't heard from the provider (or me) within 30 minutes,  call again. CARE ADVICE given per Recent Medical Visit for Illness: Follow-Up Call (Adult) guideline. * You become worse CALL BACK  IF: Comments User: Jaclynn Major, RN Date/Time Lamount Cohen Time): 01/14/2023 11:34:30 AM says when she voids her pain level goes down User: Jaclynn Major, RN Date/Time Lamount Cohen Time): 01/14/2023 11:40:05 AM patient wants to go to [facility] - ED

## 2023-01-14 NOTE — Telephone Encounter (Signed)
See result note.  

## 2023-01-14 NOTE — Telephone Encounter (Signed)
Access nurse states: - Final outcome is to call PCP due to being seen for issue yesterday  - Current pain level is 4/10; pain still present on left side but radiated to right side of abdomen  - No other symptoms  - Pt informed her that PCP mentioned possibly getting a CT scan done   Please Advise.

## 2023-01-15 ENCOUNTER — Other Ambulatory Visit: Payer: Self-pay | Admitting: Family Medicine

## 2023-01-15 MED ORDER — AMOXICILLIN-POT CLAVULANATE 875-125 MG PO TABS: 1.0000 | ORAL_TABLET | Freq: Two times a day (BID) | ORAL | 0 refills | Status: AC

## 2023-01-15 NOTE — Telephone Encounter (Signed)
CT scheduled and pt made aware per [facility].

## 2023-01-21 ENCOUNTER — Ambulatory Visit: Payer: Medicare Other | Admitting: Family Medicine

## 2023-01-23 DIAGNOSIS — D329 Benign neoplasm of meninges, unspecified: Secondary | ICD-10-CM | POA: Diagnosis not present

## 2023-01-23 DIAGNOSIS — D320 Benign neoplasm of cerebral meninges: Secondary | ICD-10-CM | POA: Diagnosis not present

## 2023-02-12 DIAGNOSIS — L304 Erythema intertrigo: Secondary | ICD-10-CM | POA: Diagnosis not present

## 2023-02-16 ENCOUNTER — Encounter (HOSPITAL_BASED_OUTPATIENT_CLINIC_OR_DEPARTMENT_OTHER): Payer: Self-pay

## 2023-02-16 ENCOUNTER — Ambulatory Visit (HOSPITAL_BASED_OUTPATIENT_CLINIC_OR_DEPARTMENT_OTHER)
Admission: RE | Admit: 2023-02-16 | Discharge: 2023-02-16 | Disposition: A | Discharge status: Home / Self Care | Payer: Medicare Other | Source: Ambulatory Visit | Attending: Family Medicine | Admitting: Family Medicine

## 2023-02-16 DIAGNOSIS — Z1231 Encounter for screening mammogram for malignant neoplasm of breast: Secondary | ICD-10-CM | POA: Insufficient documentation

## 2023-02-24 NOTE — Progress Notes (Addendum)
[name] [address] [place] [address] Phone: [phone] Subjective:   [name], am serving as a scribe for Dr. Antoine Primas.  I'm seeing this patient by the request  of:  Shelva Majestic, MD  CC: Shoulder and arm pain as well as knee pain follow-up  [id number]  12/23/2022 Will need to monitor with patient having difficulty with this in the past and we started compensating for the bicep tendon.     Injury noted.  Patient likely has a partial tear noted.  Held on any type of imaging today.  Discussed the potential for x-rays, ultrasound and formal physical therapy.  Patient will start with home exercises and compression sleeve.  Worsening pain to come see [place] sooner.  Patient does not have chest pain and not concerning for any other abnormality that would be too concerning at the moment.  Follow-up again in 6 to 8 weeks     Updated 02/25/2023 ANEESAH HERNAN is a 77 y.o. female coming in with complaint of shoulder and arm pain. Pain in top of shoulder with flexion.   Knee pain has been increasing. Would like injections today.   Also c/o R piriformis pain. Pain after pulling out yard furniture and gardening.       Past Medical History:  Diagnosis Date   Allergy    Arthritis    OA   Cancer (HCC) 1980's   basal cell carcinoma   Cataract    bilateral 2 yrs ago    Diverticulosis    Excessive daytime sleepiness 06/05/2016   GERD (gastroesophageal reflux disease)    Head injury due to trauma    s/p attack - some memory loss    Headache(784.0)    Hx of colonic polyps    Hyperlipidemia    no meds    Hypertension    pt denies- on  meds    Thyroid disease    Past Surgical History:  Procedure Laterality Date   AXILLARY ABCESS IRRIGATION AND DEBRIDEMENT     cyst   bcca from eyelid     basal cell 1980s.    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 1 son. 2 twin grandkids age 18 in 2- in [place].       Retired from [employer].       Works with Psychologist, sport and exercise for [employer].  Left handed   Drinks caffeine one story   Social Determinants of Health   Financial Resource Strain: Medium Risk (01/13/2023)   Overall Financial Resource Strain (CARDIA)    Difficulty of Paying Living Expenses: Somewhat hard  Food Insecurity: No Food Insecurity (01/13/2023)   Hunger Vital Sign    Worried About Running Out of Food in the Last Year: Never true    Ran Out of Food in the Last Year: Never true  Transportation Needs: No Transportation Needs (01/13/2023)   PRAPARE - Administrator, Civil Service (Medical): No    Lack of Transportation (Non-Medical): No  Physical Activity: Inactive (01/13/2023)   Exercise Vital Sign    Days of Exercise per Week: 0 days    Minutes of Exercise per Session: 0 min  Stress: No Stress Concern Present (01/13/2023)   [employer] of Occupational Health - Occupational Stress Questionnaire    Feeling of Stress : Only a little  Social Connections: Moderately Integrated (01/13/2023)   Social Connection and Isolation Panel [NHANES]    Frequency of  Communication with Friends and Family: More than three times a week    Frequency of Social Gatherings with Friends and Family: More than three times a week    Attends Religious Services: 1 to 4 times per year    Active Member of [employer] or Organizations: Yes    Attends Engineer, structural: More than 4 times per year    Marital Status: Divorced   Allergies  Allergen Reactions   Naprosyn [Naproxen] Swelling   Terbinafine Hcl Swelling    Lamisil    Family History  Problem Relation Age of Onset   Cancer Mother        lung- nonsmoker. in her 43s, perhaps related to job   Hypertension Mother    Lung cancer Mother    Liver disease Father    Alcohol abuse Father    Hypertension Father    Ovarian cancer Maternal Grandmother    Hodgkin's lymphoma Maternal Grandfather    Dementia Paternal Grandmother    Tuberculosis Paternal Grandfather    Colon cancer Maternal Aunt    Colon cancer Maternal Uncle    Colon polyps Neg Hx    Esophageal cancer Neg Hx    Rectal cancer Neg Hx    Stomach cancer Neg Hx     Current Outpatient Medications (Endocrine & Metabolic):    levothyroxine (SYNTHROID) 75 MCG tablet, TAKE 1 TABLET BY MOUTH EVERY DAY  Current Outpatient Medications (Cardiovascular):    atorvastatin (LIPITOR) 10 MG tablet, TAKE 1 TABLET BY MOUTH EVERY DAY   bisoprolol-hydrochlorothiazide (ZIAC) 2.5-6.25 MG tablet, Take 2 tablets by mouth daily. Has extra at home- is taking 2 until she runs out and then we can send in new rx   EPINEPHrine (EPIPEN JR) 0.15 MG/0.3ML injection, Inject 0.15 mg into the muscle as needed.    Current Outpatient Medications (Hematological):    vitamin B-12 (CYANOCOBALAMIN) 1000 MCG tablet, Take 1,000 mcg by mouth daily.  Current Outpatient Medications (Other):    Ascorbic Acid (VITAMIN C WITH ROSE HIPS) 500 MG tablet, Take 500 mg by mouth daily.   Biotin 10 MG CHEW, Chew by mouth.   CALCIUM-MAGNESIUM-ZINC PO, Take by mouth.   Cholecalciferol (VITAMIN  D3) 10 MCG (400 UNIT) CAPS, Take 10 mcg by mouth.   Clobetasol Prop Emollient Base (CLOBETASOL PROPIONATE E) 0.05 % emollient cream, Apply 1 Application topically at bedtime.   clotrimazole (LOTRIMIN) 1 % cream, Apply 1  Application topically 2 (two) times daily.   co-enzyme Q-10 30 MG capsule, Take 200 mg by mouth daily.   diclofenac sodium (VOLTAREN) 1 % GEL, Apply topically to affected area qid   hydrocortisone 2.5 % cream, Apply topically in the morning and at bedtime.   Multiple Vitamins-Minerals (WOMENS 50+ ADVANCED PO), Take 2 tablets by mouth daily.   omeprazole (PRILOSEC) 20 MG capsule, TAKE 1-2 CAPSULES BY MOUTH EVERY DAY   Probiotic Product (CULTURELLE PROBIOTICS PO), Take by mouth.   Saccharomyces boulardii (PROBIOTIC) 250 MG CAPS, Take by mouth.   Reviewed prior external information including notes and imaging from  primary care provider As well as notes that were available from care everywhere and other healthcare systems.  Past medical history, social, surgical and family history all reviewed in electronic medical record.  No pertanent information unless stated regarding to the chief complaint.   Review of Systems:  No headache, visual changes, nausea, vomiting, diarrhea, constipation, dizziness, abdominal pain, skin rash, fevers, chills, night sweats, weight loss, swollen lymph nodes, body aches, joint swelling, chest pain, shortness of breath, mood changes. POSITIVE muscle aches  Objective  Blood pressure 118/72, pulse 63, height 5\' 2"  (1.575 m), weight 174 lb (78.9 kg), SpO2 96 %.   General: No apparent distress alert and oriented x3 mood and affect normal, dressed appropriately.  HEENT: Pupils equal, extraocular movements intact  Respiratory: Patient's speak in full sentences and does not appear short of breath  Cardiovascular: No lower extremity edema, non tender, no erythema  Left shoulder exam shows improvement noted today.  Patient does have still mild impingement  noted at this time. Patient's bilateral knees do have crepitus noted.  Trace effusion noted of the knees bilaterally.  Limited muscular skeletal ultrasound was performed and interpreted by Antoine Primas, M  Limited ultrasound of patient's shoulder shows that there is some scar tissue formation noted on supraspinatus that we saw previously.  Decrease in hypoechoic changes noted as well. Impression: Supraspinatus with interval improvement  After informed written and verbal consent, patient was seated on exam table. Right knee was prepped with alcohol swab and utilizing anterolateral approach, patient's right knee space was injected with 4:1  marcaine 0.5%: Kenalog 40mg /dL. Patient tolerated the procedure well without immediate complications.  After informed written and verbal consent, patient was seated on exam table. Left knee was prepped with alcohol swab and utilizing anterolateral approach, patient's left knee space was injected with 4:1  marcaine 0.5%: Kenalog 40mg /dL. Patient tolerated the procedure well without immediate complications.   Impression and Recommendations:     The above documentation has been reviewed and is accurate and complete Judi Saa, DO

## 2023-02-25 ENCOUNTER — Other Ambulatory Visit: Payer: Self-pay

## 2023-02-25 ENCOUNTER — Ambulatory Visit (INDEPENDENT_AMBULATORY_CARE_PROVIDER_SITE_OTHER): Payer: Medicare Other | Admitting: Family Medicine

## 2023-02-25 ENCOUNTER — Encounter: Payer: Self-pay | Admitting: Family Medicine

## 2023-02-25 VITALS — BP 118/72 | HR 63 | Ht 62.0 in | Wt 174.0 lb

## 2023-02-25 DIAGNOSIS — M170 Bilateral primary osteoarthritis of knee: Secondary | ICD-10-CM | POA: Diagnosis not present

## 2023-02-25 DIAGNOSIS — M25512 Pain in left shoulder: Secondary | ICD-10-CM

## 2023-02-25 DIAGNOSIS — G8929 Other chronic pain: Secondary | ICD-10-CM | POA: Diagnosis not present

## 2023-02-25 NOTE — Assessment & Plan Note (Signed)
Patient does have healing noted.  We discussed with patient about which activities to do and which ones to avoid.  Do believe that she is progressing appropriately.  Follow-up again in 2 months

## 2023-02-25 NOTE — Assessment & Plan Note (Signed)
Chronic problem with worsening symptoms.  Has responded well to injections previously.  Discussed which activities to do and which ones to avoid.  Hopefully this does make some improvement for patient.  Could help her hip pain that I think is secondary to compensation as well.  Follow-up with me again in 2 months

## 2023-02-25 NOTE — Patient Instructions (Addendum)
Injected both knees today Shoulder is making improvements See you again in 2 months

## 2023-03-10 ENCOUNTER — Encounter: Payer: Self-pay | Admitting: Family

## 2023-03-10 ENCOUNTER — Telehealth (INDEPENDENT_AMBULATORY_CARE_PROVIDER_SITE_OTHER): Payer: Medicare Other | Admitting: Family

## 2023-03-10 VITALS — Ht 62.0 in | Wt 172.0 lb

## 2023-03-10 DIAGNOSIS — U071 COVID-19: Secondary | ICD-10-CM | POA: Diagnosis not present

## 2023-03-10 MED ORDER — NIRMATRELVIR/RITONAVIR (PAXLOVID)TABLET
3.0000 | ORAL_TABLET | Freq: Two times a day (BID) | ORAL | 0 refills | Status: AC
Start: 2023-03-10 — End: 2023-03-15

## 2023-03-10 NOTE — Progress Notes (Signed)
MyChart Video Visit    Virtual Visit via Video Note   This format is felt to be most appropriate for this patient at this time. Physical exam was limited by quality of the video and audio technology used for the visit. CMA was able to get the patient set up on a video visit.  Patient location: Home. Patient and provider in visit Provider location: Office  I discussed the limitations of evaluation and management by telemedicine and the availability of in person appointments. The patient expressed understanding and agreed to proceed.  Visit Date: 03/10/2023  Today's healthcare provider: Dulce Sellar, NP     Subjective:   Patient ID: Kelly Frazier, female    DOB: [birth year], 77 y.o.   MRN: [id number]  Chief Complaint  Patient presents with   Covid Positive    sx for 4d     HPI Covid positive: Pt tested for covid positive at home yesterday, SX include nasal drip/congestion, sore throat, headache, cough with yellow, fatigue and body aches Denies fever. Had tried rest, liquids and tylenol which did help headache.  Assessment & Plan:  COVID-19 - Sending Paxlovid pt advised of FDA label approval for use, how to take, & SE. Pt advised to hold her Lipitor for the next 6 days, then resume. Advised of CDC guidelines for masking if out in public. OK to continue taking OTC sinus or pain meds. Encouraged to monitor & notify office of any worsening symptoms: increased shortness of breath, weakness, and signs of dehydration. Instructed to rest and hydrate well.   -     nirmatrelvir/ritonavir; Take 3 tablets by mouth 2 (two) times daily for 5 days. (Take nirmatrelvir 150 mg two tablets twice daily for 5 days and ritonavir 100 mg one tablet twice daily for 5 days) Patient GFR is 78.  Dispense: 30 tablet; Refill: 0   Past Medical History:  Diagnosis Date   Allergy    Arthritis    OA   Cancer (HCC) 1980's   basal cell carcinoma   Cataract    bilateral 2 yrs ago     Diverticulosis    Excessive daytime sleepiness 06/05/2016   GERD (gastroesophageal reflux disease)    Head injury due to trauma    s/p attack - some memory loss    Headache(784.0)    Hx of colonic polyps    Hyperlipidemia    no meds    Hypertension    pt denies- on  meds    Thyroid disease     Past Surgical History:  Procedure Laterality Date   AXILLARY ABCESS IRRIGATION AND DEBRIDEMENT     cyst   bcca from eyelid     basal cell 1980s.    BREAST BIOPSY  2006   and 2017   CATARACT EXTRACTION, BILATERAL     COLONOSCOPY  08/17/06   NSVD  1971   x1   osseous implant     POLYPECTOMY     removal of moles and denmatoid      Outpatient Medications Prior to Visit  Medication Sig Dispense Refill   Ascorbic Acid (VITAMIN C WITH ROSE HIPS) 500 MG tablet Take 500 mg by mouth daily.     atorvastatin (LIPITOR) 10 MG tablet TAKE 1 TABLET BY MOUTH EVERY DAY 90 tablet 2   Biotin 10 MG CHEW Chew by mouth.     bisoprolol-hydrochlorothiazide (ZIAC) 2.5-6.25 MG tablet Take 2 tablets by mouth daily. Has extra at home- is taking 2 until she  runs out and then we can send in new rx 1 tablet 0   CALCIUM-MAGNESIUM-ZINC PO Take by mouth.     Cholecalciferol (VITAMIN D3) 10 MCG (400 UNIT) CAPS Take 10 mcg by mouth.     Clobetasol Prop Emollient Base (CLOBETASOL PROPIONATE E) 0.05 % emollient cream Apply 1 Application topically at bedtime. 30 g 11   clotrimazole (LOTRIMIN) 1 % cream Apply 1 Application topically 2 (two) times daily.     co-enzyme Q-10 30 MG capsule Take 200 mg by mouth daily.     diclofenac sodium (VOLTAREN) 1 % GEL Apply topically to affected area qid 100 g 1   EPINEPHrine (EPIPEN JR) 0.15 MG/0.3ML injection Inject 0.15 mg into the muscle as needed.     hydrocortisone 2.5 % cream Apply topically in the morning and at bedtime.     levothyroxine (SYNTHROID) 75 MCG tablet TAKE 1 TABLET BY MOUTH EVERY DAY 90 tablet 2   Multiple Vitamins-Minerals (WOMENS 50+ ADVANCED PO) Take 2 tablets by  mouth daily.     omeprazole (PRILOSEC) 20 MG capsule TAKE 1-2 CAPSULES BY MOUTH EVERY DAY 180 capsule 3   Probiotic Product (CULTURELLE PROBIOTICS PO) Take by mouth.     Saccharomyces boulardii (PROBIOTIC) 250 MG CAPS Take by mouth.     vitamin B-12 (CYANOCOBALAMIN) 1000 MCG tablet Take 1,000 mcg by mouth daily.     No facility-administered medications prior to visit.    Allergies  Allergen Reactions   Naprosyn [Naproxen] Swelling   Terbinafine Hcl Swelling    Lamisil        Objective:   Physical Exam Vitals and nursing note reviewed.  Constitutional:      General: Pt is not in acute distress.    Appearance: Normal appearance.  HENT:     Head: Normocephalic.  Pulmonary:     Effort: No respiratory distress.  Musculoskeletal:     Cervical back: Normal range of motion.  Skin:    General: Skin is dry.     Coloration: Skin is not pale.  Neurological:     Mental Status: Pt is alert and oriented to person, place, and time.  Psychiatric:        Mood and Affect: Mood normal.   Ht 5\' 2"  (1.575 m)   Wt 172 lb (78 kg)   BMI 31.46 kg/m   Wt Readings from Last 3 Encounters:  03/10/23 172 lb (78 kg)  02/25/23 174 lb (78.9 kg)  01/13/23 179 lb 9.6 oz (81.5 kg)       I discussed the assessment and treatment plan with the patient. The patient was provided an opportunity to ask questions and all were answered. The patient agreed with the plan and demonstrated an understanding of the instructions.   The patient was advised to call back or seek an in-person evaluation if the symptoms worsen or if the condition fails to improve as anticipated.  Dulce Sellar, NP [facility] [place] [phone] (phone) [phone] (fax)  [facility]

## 2023-03-24 ENCOUNTER — Encounter: Payer: Self-pay | Admitting: Nurse Practitioner

## 2023-03-24 ENCOUNTER — Ambulatory Visit (INDEPENDENT_AMBULATORY_CARE_PROVIDER_SITE_OTHER): Payer: Medicare Other | Admitting: Nurse Practitioner

## 2023-03-24 VITALS — BP 128/60 | HR 66 | Ht 62.0 in | Wt 170.0 lb

## 2023-03-24 DIAGNOSIS — Z86010 Personal history of colonic polyps: Secondary | ICD-10-CM

## 2023-03-24 DIAGNOSIS — R1310 Dysphagia, unspecified: Secondary | ICD-10-CM

## 2023-03-24 DIAGNOSIS — K5792 Diverticulitis of intestine, part unspecified, without perforation or abscess without bleeding: Secondary | ICD-10-CM

## 2023-03-24 MED ORDER — NA SULFATE-K SULFATE-MG SULF 17.5-3.13-1.6 GM/177ML PO SOLN: ORAL | 0 refills | Status: DC
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
## 2023-03-24 NOTE — Patient Instructions (Addendum)
You have been scheduled for an endoscopy and colonoscopy. Please follow the written instructions given to you at your visit today.  Please pick up your prep supplies at the pharmacy within the next 1-3 days.  If you use inhalers (even only as needed), please bring them with you on the day of your procedure.  DO NOT TAKE 7 DAYS PRIOR TO TEST- Trulicity (dulaglutide) Ozempic, Wegovy (semaglutide) Mounjaro (tirzepatide) Bydureon Bcise (exanatide extended release)  DO NOT TAKE 1 DAY PRIOR TO YOUR TEST Rybelsus (semaglutide) Adlyxin (lixisenatide) Victoza (liraglutide) Byetta (exanatide) ___________________________________________________________________________  Miralax- every night as needed to avoid constipation  Contact our office if your lower abdominal pain recurs.  Due to recent changes in healthcare laws, you may see the results of your imaging and laboratory studies on MyChart before your provider has had a chance to review them.  We understand that in some cases there may be results that are confusing or concerning to you. Not all laboratory results come back in the same time frame and the provider may be waiting for multiple results in order to interpret others.  Please give [place] 48 hours in order for your provider to thoroughly review all the results before contacting the office for clarification of your results.   Thank you for trusting me with your gastrointestinal care!   Alcide Evener, CRNP

## 2023-04-27 ENCOUNTER — Ambulatory Visit (INDEPENDENT_AMBULATORY_CARE_PROVIDER_SITE_OTHER): Payer: Medicare Other

## 2023-04-27 VITALS — BP 124/80 | HR 68 | Temp 98.0°F | Wt 172.6 lb

## 2023-04-27 DIAGNOSIS — Z0000 Encounter for general adult medical examination without abnormal findings: Secondary | ICD-10-CM | POA: Diagnosis not present
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
## 2023-04-27 NOTE — Patient Instructions (Signed)
Kelly Frazier , Thank you for taking time to come for your Medicare Wellness Visit. I appreciate your ongoing commitment to your health goals. Please review the following plan we discussed and let me know if I can assist you in the future.   Referrals/Orders/Follow-Ups/Clinician Recommendations: lose weight and exercise   This is a list of the screening recommended for you and due dates:  Health Maintenance  Topic Date Due   Colon Cancer Screening  06/29/2022   COVID-19 Vaccine (7 - 2023-24 season) 08/16/2022   Flu Shot  04/16/2023   Medicare Annual Wellness Visit  04/26/2024   DTaP/Tdap/Td vaccine (3 - Tdap) 11/23/2024   Pneumonia Vaccine  Completed   DEXA scan (bone density measurement)  Completed   Hepatitis C Screening  Completed   Zoster (Shingles) Vaccine  Completed   HPV Vaccine  Aged Out    Advanced directives: (Declined) Advance directive discussed with you today. Even though you declined this today, please call our office should you change your mind, and we can give you the proper paperwork for you to fill out.  Next Medicare Annual Wellness Visit scheduled for next year: Yes  Preventive Care 2 Years and Older, Female Preventive care refers to lifestyle choices and visits with your health care provider that can promote health and wellness. What does preventive care include? A yearly physical exam. This is also called an annual well check. Dental exams once or twice a year. Routine eye exams. Ask your health care provider how often you should have your eyes checked. Personal lifestyle choices, including: Daily care of your teeth and gums. Regular physical activity. Eating a healthy diet. Avoiding tobacco and drug use. Limiting alcohol use. Practicing safe sex. Taking low-dose aspirin every day. Taking vitamin and mineral supplements as recommended by your health care provider. What happens during an annual well check? The services and screenings done by your health care  provider during your annual well check will depend on your age, overall health, lifestyle risk factors, and family history of disease. Counseling  Your health care provider may ask you questions about your: Alcohol use. Tobacco use. Drug use. Emotional well-being. Home and relationship well-being. Sexual activity. Eating habits. History of falls. Memory and ability to understand (cognition). Work and work Astronomer. Reproductive health. Screening   You may have the following tests or measurements: Height, weight, and BMI. Blood pressure. Lipid and cholesterol levels. These may be checked every 5 years, or more frequently if you are over 41 years old. Skin check. Lung cancer screening. You may have this screening every year starting at age 34 if you have a 30-pack-year history of smoking and currently smoke or have quit within the past 15 years. Fecal occult blood test (FOBT) of the stool. You may have this test every year starting at age 64. Flexible sigmoidoscopy or colonoscopy. You may have a sigmoidoscopy every 5 years or a colonoscopy every 10 years starting at age 67. Hepatitis C blood test. Hepatitis B blood test. Sexually transmitted disease (STD) testing. Diabetes screening. This is done by checking your blood sugar (glucose) after you have not eaten for a while (fasting). You may have this done every 1-3 years. Bone density scan. This is done to screen for osteoporosis. You may have this done starting at age 42. Mammogram. This may be done every 1-2 years. Talk to your health care provider about how often you should have regular mammograms. Talk with your health care provider about your test results, treatment options,  and if necessary, the need for more tests. Vaccines  Your health care provider may recommend certain vaccines, such as: Influenza vaccine. This is recommended every year. Tetanus, diphtheria, and acellular pertussis (Tdap, Td) vaccine. You may need a Td  booster every 10 years. Zoster vaccine. You may need this after age 67. Pneumococcal 13-valent conjugate (PCV13) vaccine. One dose is recommended after age 41. Pneumococcal polysaccharide (PPSV23) vaccine. One dose is recommended after age 92. Talk to your health care provider about which screenings and vaccines you need and how often you need them. This information is not intended to replace advice given to you by your health care provider. Make sure you discuss any questions you have with your health care provider. Document Released: 09/28/2015 Document Revised: 05/21/2016 Document Reviewed: 07/03/2015 Elsevier Interactive Patient Education  2017 [employer].  Fall Prevention in the Home Falls can cause injuries. They can happen to people of all ages. There are many things you can do to make your home safe and to help prevent falls. What can I do on the outside of my home? Regularly fix the edges of walkways and driveways and fix any cracks. Remove anything that might make you trip as you walk through a door, such as a raised step or threshold. Trim any bushes or trees on the path to your home. Use bright outdoor lighting. Clear any walking paths of anything that might make someone trip, such as rocks or tools. Regularly check to see if handrails are loose or broken. Make sure that both sides of any steps have handrails. Any raised decks and porches should have guardrails on the edges. Have any leaves, snow, or ice cleared regularly. Use sand or salt on walking paths during winter. Clean up any spills in your garage right away. This includes oil or grease spills. What can I do in the bathroom? Use night lights. Install grab bars by the toilet and in the tub and shower. Do not use towel bars as grab bars. Use non-skid mats or decals in the tub or shower. If you need to sit down in the shower, use a plastic, non-slip stool. Keep the floor dry. Clean up any water that spills on the floor  as soon as it happens. Remove soap buildup in the tub or shower regularly. Attach bath mats securely with double-sided non-slip rug tape. Do not have throw rugs and other things on the floor that can make you trip. What can I do in the bedroom? Use night lights. Make sure that you have a light by your bed that is easy to reach. Do not use any sheets or blankets that are too big for your bed. They should not hang down onto the floor. Have a firm chair that has side arms. You can use this for support while you get dressed. Do not have throw rugs and other things on the floor that can make you trip. What can I do in the kitchen? Clean up any spills right away. Avoid walking on wet floors. Keep items that you use a lot in easy-to-reach places. If you need to reach something above you, use a strong step stool that has a grab bar. Keep electrical cords out of the way. Do not use floor polish or wax that makes floors slippery. If you must use wax, use non-skid floor wax. Do not have throw rugs and other things on the floor that can make you trip. What can I do with my stairs? Do not leave  any items on the stairs. Make sure that there are handrails on both sides of the stairs and use them. Fix handrails that are broken or loose. Make sure that handrails are as long as the stairways. Check any carpeting to make sure that it is firmly attached to the stairs. Fix any carpet that is loose or worn. Avoid having throw rugs at the top or bottom of the stairs. If you do have throw rugs, attach them to the floor with carpet tape. Make sure that you have a light switch at the top of the stairs and the bottom of the stairs. If you do not have them, ask someone to add them for you. What else can I do to help prevent falls? Wear shoes that: Do not have high heels. Have rubber bottoms. Are comfortable and fit you well. Are closed at the toe. Do not wear sandals. If you use a stepladder: Make sure that it is  fully opened. Do not climb a closed stepladder. Make sure that both sides of the stepladder are locked into place. Ask someone to hold it for you, if possible. Clearly mark and make sure that you can see: Any grab bars or handrails. First and last steps. Where the edge of each step is. Use tools that help you move around (mobility aids) if they are needed. These include: Canes. Walkers. Scooters. Crutches. Turn on the lights when you go into a dark area. Replace any light bulbs as soon as they burn out. Set up your furniture so you have a clear path. Avoid moving your furniture around. If any of your floors are uneven, fix them. If there are any pets around you, be aware of where they are. Review your medicines with your doctor. Some medicines can make you feel dizzy. This can increase your chance of falling. Ask your doctor what other things that you can do to help prevent falls. This information is not intended to replace advice given to you by your health care provider. Make sure you discuss any questions you have with your health care provider. Document Released: 06/28/2009 Document Revised: 02/07/2016 Document Reviewed: 10/06/2014 Elsevier Interactive Patient Education  2017 [employer].

## 2023-04-28 NOTE — Progress Notes (Signed)
[name] [address] [place] [address] Phone: [phone] Subjective:   Kelly Frazier, am serving as a scribe for Dr. Antoine Primas.  I'm seeing this patient by the request  of:  Shelva Majestic, MD  CC: Left shoulder pain follow-up  [id number]  02/25/2023 Chronic problem with worsening symptoms. Has responded well to injections previously. Discussed which activities to do and which ones to avoid. Hopefully this does make some improvement for patient. Could help her hip pain that I think is secondary to compensation as well. Follow-up with me again in 2 months   Patient does have healing noted.  We discussed with patient about which activities to do and which ones to avoid.  Do believe that she is progressing appropriately.  Follow-up again in 2 months     Updated 04/29/2023 Kelly Frazier is a 77 y.o. female coming in with complaint of shoulder and knee pain, patient was seen previously and given injections of the knees bilaterally.  Patient did have interval improvement of the supraspinatus with scar tissue formation noted on ultrasound.  Patient states that she would like injections in B knees.   B shoulders ache from time to time. No worse than last visit.       Past Medical History:  Diagnosis Date   Allergy    Arthritis    OA   Cancer (HCC) 1980's   basal cell carcinoma   Cataract    bilateral 2 yrs ago    Diverticulosis    Excessive daytime sleepiness 06/05/2016   GERD (gastroesophageal reflux disease)    Head injury due to trauma    s/p attack - some memory loss    Headache(784.0)    Hx of colonic polyps    Hyperlipidemia    no meds    Hypertension    pt denies- on  meds    Thyroid disease    Past Surgical History:  Procedure Laterality Date   AXILLARY ABCESS IRRIGATION AND DEBRIDEMENT     cyst   bcca from eyelid     basal cell 1980s.    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 1 son. 2 twin grandkids age 54 in 53- in [place].       Retired from [employer].       Works with Psychologist, sport and exercise for [employer].    Left  handed   Drinks caffeine one story   Social Determinants of Health   Financial Resource Strain: Medium Risk (04/23/2023)   Overall Financial Resource Strain (CARDIA)    Difficulty of Paying Living Expenses: Somewhat hard  Food Insecurity: No Food Insecurity (04/23/2023)   Hunger Vital Sign    Worried About Running Out of Food in the Last Year: Never true    Ran Out of Food in the Last Year: Never true  Transportation Needs: No Transportation Needs (04/23/2023)   PRAPARE - Administrator, Civil Service (Medical): No    Lack of Transportation (Non-Medical): No  Physical Activity: Inactive (04/23/2023)   Exercise Vital Sign    Days of Exercise per Week: 0 days    Minutes of Exercise per Session: 0 min  Stress: Stress Concern Present (04/23/2023)   [employer] of Occupational Health - Occupational Stress Questionnaire    Feeling of Stress : To some extent  Social Connections: Moderately Integrated (04/23/2023)   Social Connection and Isolation  Panel [NHANES]    Frequency of Communication with Friends and Family: More than three times a week    Frequency of Social Gatherings with Friends and Family: More than three times a week    Attends Religious Services: 1 to 4 times per year    Active Member of [employer] or Organizations: Yes    Attends Engineer, structural: More than 4 times per year    Marital Status: Divorced   Allergies  Allergen Reactions   Naprosyn [Naproxen] Swelling   Terbinafine Hcl Swelling    Lamisil    Family History  Problem Relation Age of Onset   Cancer - Lung Mother        lung- nonsmoker. in her 65s, perhaps related to job   Hypertension Mother    Lung cancer Mother    Liver disease Father    Alcohol abuse Father    Hypertension Father    Ovarian cancer Maternal Grandmother    Hodgkin's lymphoma Maternal Grandfather    Dementia Paternal Grandmother    Tuberculosis Paternal Grandfather    Colon cancer Maternal Aunt    Colon cancer Maternal Uncle    Colon polyps Neg Hx    Esophageal cancer Neg Hx    Rectal cancer Neg Hx    Stomach cancer Neg Hx    Pancreatic cancer Neg Hx     Current Outpatient Medications (Endocrine & Metabolic):    levothyroxine (SYNTHROID) 75 MCG tablet, TAKE 1 TABLET BY MOUTH EVERY DAY  Current Outpatient Medications (Cardiovascular):    atorvastatin (LIPITOR) 10 MG tablet, TAKE 1 TABLET BY MOUTH EVERY DAY   bisoprolol-hydrochlorothiazide (ZIAC) 2.5-6.25 MG tablet, Take 2 tablets by mouth daily. Has extra at home- is taking 2 until she runs out and then we can send in new rx   EPINEPHrine (EPIPEN JR) 0.15 MG/0.3ML injection, Inject 0.15 mg into the muscle as needed.    Current Outpatient Medications (Hematological):    vitamin B-12 (CYANOCOBALAMIN) 1000 MCG tablet, Take 1,000 mcg by mouth daily.  Current Outpatient Medications (Other):    Ascorbic Acid (VITAMIN C WITH ROSE HIPS) 500 MG tablet, Take 500 mg by mouth daily.   Biotin 10 MG CHEW, Chew by mouth.    CALCIUM-MAGNESIUM-ZINC PO, Take by mouth.   Cholecalciferol (VITAMIN D3) 10 MCG (400 UNIT) CAPS, Take 10 mcg by mouth.   Clobetasol Prop Emollient Base (CLOBETASOL PROPIONATE E) 0.05 % emollient cream, Apply 1 Application topically at bedtime.  clotrimazole (LOTRIMIN) 1 % cream, Apply 1 Application topically 2 (two) times daily.   co-enzyme Q-10 30 MG capsule, Take 200 mg by mouth daily.   diclofenac sodium (VOLTAREN) 1 % GEL, Apply topically to affected area qid   hydrocortisone 2.5 % cream, Apply topically in the morning and at bedtime.   Multiple Vitamins-Minerals (WOMENS 50+ ADVANCED PO), Take 2 tablets by mouth daily.   Na Sulfate-K Sulfate-Mg Sulf 17.5-3.13-1.6 GM/177ML SOLN, Use as directed; may use generic; goodrx card if insurance will not cover generic   omeprazole (PRILOSEC) 20 MG capsule, TAKE 1-2 CAPSULES BY MOUTH EVERY DAY   Probiotic Product (CULTURELLE PROBIOTICS PO), Take by mouth. Garden of life   Probiotic Product (PROBIOTIC DAILY PO), Take by mouth. Garden of life women over 50 raw probiotics   Reviewed prior external information including notes and imaging from  primary care provider As well as notes that were available from care everywhere and other healthcare systems.  Past medical history, social, surgical and family history all reviewed in electronic medical record.  No pertanent information unless stated regarding to the chief complaint.   Review of Systems:  No headache, visual changes, nausea, vomiting, diarrhea, constipation, dizziness, abdominal pain, skin rash, fevers, chills, night sweats, weight loss, swollen lymph nodes, body aches, joint swelling, chest pain, shortness of breath, mood changes. POSITIVE muscle aches  Objective  Blood pressure 112/72, pulse 69, height 5\' 2"  (1.575 m), weight 170 lb (77.1 kg), SpO2 100%.   General: No apparent distress alert and oriented x3 mood and affect normal, dressed appropriately.  HEENT: Pupils equal, extraocular  movements intact  Respiratory: Patient's speak in full sentences and does not appear short of breath  Cardiovascular: No lower extremity edema, non tender, no erythema  Patient does have an antalgic gait noted.  Patient does have some crepitus noted.  Bilateral shoulders to have mild impingement noted.  Patient is tender to palpation in the soft tissues of the shoulders, elbows, knees  After informed written and verbal consent, patient was seated on exam table. Right knee was prepped with alcohol swab and utilizing anterolateral approach, patient's right knee space was injected with 4:1  marcaine 0.5%: Kenalog 40mg /dL. Patient tolerated the procedure well without immediate complications.  After informed written and verbal consent, patient was seated on exam table. Left knee was prepped with alcohol swab and utilizing anterolateral approach, patient's left knee space was injected with 4:1  marcaine 0.5%: Kenalog 40mg /dL. Patient tolerated the procedure well without immediate complications.    Impression and Recommendations:    The above documentation has been reviewed and is accurate and complete Judi Saa, DO

## 2023-04-29 ENCOUNTER — Other Ambulatory Visit: Payer: Self-pay

## 2023-04-29 ENCOUNTER — Ambulatory Visit (INDEPENDENT_AMBULATORY_CARE_PROVIDER_SITE_OTHER): Payer: Medicare Other | Admitting: Family Medicine

## 2023-04-29 ENCOUNTER — Encounter: Payer: Self-pay | Admitting: Family Medicine

## 2023-04-29 VITALS — BP 112/72 | HR 69 | Ht 62.0 in | Wt 170.0 lb

## 2023-04-29 DIAGNOSIS — M170 Bilateral primary osteoarthritis of knee: Secondary | ICD-10-CM | POA: Diagnosis not present

## 2023-04-29 DIAGNOSIS — M25512 Pain in left shoulder: Secondary | ICD-10-CM

## 2023-04-29 DIAGNOSIS — E559 Vitamin D deficiency, unspecified: Secondary | ICD-10-CM

## 2023-04-29 DIAGNOSIS — M2550 Pain in unspecified joint: Secondary | ICD-10-CM

## 2023-04-29 DIAGNOSIS — G8929 Other chronic pain: Secondary | ICD-10-CM | POA: Diagnosis not present

## 2023-04-29 DIAGNOSIS — E038 Other specified hypothyroidism: Secondary | ICD-10-CM | POA: Diagnosis not present

## 2023-04-29 LAB — C-REACTIVE PROTEIN: CRP: <1 mg/dL (ref 0.5–20.0)

## 2023-04-29 LAB — IBC PANEL
Iron: 87 ug/dL (ref 42–145)
Saturation Ratios: 22.4 % (ref 20.0–50.0)
TIBC: 387.8 ug/dL (ref 250.0–450.0)
Transferrin: 277 mg/dL (ref 212.0–360.0)

## 2023-04-29 LAB — CBC WITH DIFFERENTIAL/PLATELET
Basophils Absolute: 0.1 10*3/uL (ref 0.0–0.1)
Basophils Relative: 0.5 % (ref 0.0–3.0)
Eosinophils Absolute: 0.1 10*3/uL (ref 0.0–0.7)
Eosinophils Relative: 0.9 % (ref 0.0–5.0)
HCT: 42.8 % (ref 36.0–46.0)
Hemoglobin: 14 g/dL (ref 12.0–15.0)
Lymphocytes Relative: 20.7 % (ref 12.0–46.0)
Lymphs Abs: 2.2 10*3/uL (ref 0.7–4.0)
MCHC: 32.8 g/dL (ref 30.0–36.0)
MCV: 92.4 fl (ref 78.0–100.0)
Monocytes Absolute: 0.7 10*3/uL (ref 0.1–1.0)
Monocytes Relative: 6.6 % (ref 3.0–12.0)
Neutro Abs: 7.7 10*3/uL (ref 1.4–7.7)
Neutrophils Relative %: 71.3 % (ref 43.0–77.0)
Platelets: 254 10*3/uL (ref 150.0–400.0)
RBC: 4.63 Mil/uL (ref 3.87–5.11)
RDW: 15.7 % — ABNORMAL HIGH (ref 11.5–15.5)
WBC: 10.8 10*3/uL — ABNORMAL HIGH (ref 4.0–10.5)

## 2023-04-29 LAB — VITAMIN D 25 HYDROXY (VIT D DEFICIENCY, FRACTURES): VITD: 55.68 ng/mL (ref 30.00–100.00)

## 2023-04-29 LAB — COMPREHENSIVE METABOLIC PANEL
ALT: 22 U/L (ref 0–35)
AST: 25 U/L (ref 0–37)
Albumin: 4.2 g/dL (ref 3.5–5.2)
Alkaline Phosphatase: 62 U/L (ref 39–117)
BUN: 11 mg/dL (ref 6–23)
CO2: 27 mEq/L (ref 19–32)
Calcium: 9.5 mg/dL (ref 8.4–10.5)
Chloride: 104 mEq/L (ref 96–112)
Creatinine, Ser: 0.66 mg/dL (ref 0.40–1.20)
GFR: 85.76 mL/min (ref >60.00)
Glucose, Bld: 109 mg/dL — ABNORMAL HIGH (ref 70–99)
Potassium: 3.4 mEq/L — ABNORMAL LOW (ref 3.5–5.1)
Sodium: 139 mEq/L (ref 135–145)
Total Bilirubin: 0.6 mg/dL (ref 0.2–1.2)
Total Protein: 6.9 g/dL (ref 6.0–8.3)

## 2023-04-29 LAB — VITAMIN B12: Vitamin B-12: 952 pg/mL — ABNORMAL HIGH (ref 211–911)

## 2023-04-29 LAB — TSH: TSH: 0.78 u[IU]/mL (ref 0.35–5.50)

## 2023-04-29 LAB — SEDIMENTATION RATE: Sed Rate: 23 mm/hr (ref 0–30)

## 2023-04-29 LAB — URIC ACID: Uric Acid, Serum: 5.6 mg/dL (ref 2.4–7.0)

## 2023-04-29 LAB — FERRITIN: Ferritin: 50.7 ng/mL (ref 10.0–291.0)

## 2023-04-29 NOTE — Patient Instructions (Signed)
See me in 6 weeks for shoulders Labs today

## 2023-04-29 NOTE — Assessment & Plan Note (Signed)
Continues to have left shoulder pain but nothing seems to be on the other side.  Does have some polyarthralgia like pain.  No family history of significant arthritic changes as well.  Will get autoimmune labs as well as inflammatory markers to make sure there is no polymyalgia rheumatica that could be potentially contributing.  Discussed which activities to do and which ones to avoid.  Follow-up again in 6 to 8 weeks otherwise.

## 2023-04-29 NOTE — Assessment & Plan Note (Signed)
bilateral injections April 29, 2023 patient was given injections and tolerated the procedure well, discussed icing regimen and home exercises, discussed with patient to continue to work on weight which she is doing very well with.  Continue to stay active.  Worsening pain can consider other injections follow-up with me again in 6 to 12 weeks otherwise.

## 2023-05-02 LAB — PTH, INTACT AND CALCIUM
Calcium: 11.2 mg/dL — ABNORMAL HIGH (ref 8.6–10.4)
PTH: 28 pg/mL (ref 16–77)

## 2023-05-02 LAB — ANA: Anti Nuclear Antibody (ANA): NEGATIVE

## 2023-05-02 LAB — CYCLIC CITRUL PEPTIDE ANTIBODY, IGG: Cyclic Citrullin Peptide Ab: <16 U

## 2023-05-02 LAB — CALCIUM, IONIZED: Calcium, Ion: 5 mg/dL (ref 4.7–5.5)

## 2023-05-02 LAB — ANGIOTENSIN CONVERTING ENZYME: Angiotensin-Converting Enzyme: 25 U/L (ref 9–67)

## 2023-05-02 LAB — RHEUMATOID FACTOR: Rheumatoid fact SerPl-aCnc: <10 [IU]/mL (ref <14)

## 2023-05-19 ENCOUNTER — Encounter: Payer: Self-pay | Admitting: Family Medicine

## 2023-05-20 ENCOUNTER — Encounter: Payer: Self-pay | Admitting: Gastroenterology

## 2023-05-20 ENCOUNTER — Ambulatory Visit (AMBULATORY_SURGERY_CENTER): Payer: Medicare Other | Admitting: Gastroenterology

## 2023-05-20 VITALS — BP 107/52 | HR 80 | Temp 97.3°F | Resp 23 | Ht 62.0 in | Wt 170.0 lb

## 2023-05-20 DIAGNOSIS — K219 Gastro-esophageal reflux disease without esophagitis: Secondary | ICD-10-CM

## 2023-05-20 DIAGNOSIS — I10 Essential (primary) hypertension: Secondary | ICD-10-CM | POA: Diagnosis not present

## 2023-05-20 DIAGNOSIS — K317 Polyp of stomach and duodenum: Secondary | ICD-10-CM

## 2023-05-20 DIAGNOSIS — K449 Diaphragmatic hernia without obstruction or gangrene: Secondary | ICD-10-CM

## 2023-05-20 DIAGNOSIS — R1310 Dysphagia, unspecified: Secondary | ICD-10-CM | POA: Diagnosis not present

## 2023-05-20 DIAGNOSIS — K635 Polyp of colon: Secondary | ICD-10-CM | POA: Diagnosis not present

## 2023-05-20 DIAGNOSIS — E785 Hyperlipidemia, unspecified: Secondary | ICD-10-CM | POA: Diagnosis not present

## 2023-05-20 DIAGNOSIS — Z86010 Personal history of colonic polyps: Secondary | ICD-10-CM | POA: Diagnosis not present

## 2023-05-20 DIAGNOSIS — Z09 Encounter for follow-up examination after completed treatment for conditions other than malignant neoplasm: Secondary | ICD-10-CM | POA: Diagnosis not present

## 2023-05-20 DIAGNOSIS — D123 Benign neoplasm of transverse colon: Secondary | ICD-10-CM | POA: Diagnosis not present

## 2023-05-20 MED ORDER — SODIUM CHLORIDE 0.9 % IV SOLN: 500.0000 mL | Freq: Once | INTRAVENOUS | Status: DC

## 2023-05-20 NOTE — Patient Instructions (Addendum)
- Clear liquid diet for 2 hours, then advance as tolerated to soft diet today. - Resume prior diet tomorrow. - Follow antireflux measures. - Continue present medications. - Await pathology results.  - Resume previous diet adding increased fiber. - Continue present medications. - Await pathology results.  YOU HAD AN ENDOSCOPIC PROCEDURE TODAY AT THE [facility]:   Refer to the procedure report that was given to you for any specific questions about what was found during the examination.  If the procedure report does not answer your questions, please call your gastroenterologist to clarify.  If you requested that your care partner not be given the details of your procedure findings, then the procedure report has been included in a sealed envelope for you to review at your convenience later.  YOU SHOULD EXPECT: Some feelings of bloating in the abdomen. Passage of more gas than usual.  Walking can help get rid of the air that was put into your GI tract during the procedure and reduce the bloating. If you had a lower endoscopy (such as a colonoscopy or flexible sigmoidoscopy) you may notice spotting of blood in your stool or on the toilet paper. If you underwent a bowel prep for your procedure, you may not have a normal bowel movement for a few days.  Please Note:  You might notice some irritation and congestion in your nose or some drainage.  This is from the oxygen used during your procedure.  There is no need for concern and it should clear up in a day or so.  SYMPTOMS TO REPORT IMMEDIATELY:  Following lower endoscopy (colonoscopy or flexible sigmoidoscopy):  Excessive amounts of blood in the stool  Significant tenderness or worsening of abdominal pains  Swelling of the abdomen that is new, acute  Fever of 100F or higher  Following upper endoscopy (EGD)  Vomiting of blood or coffee ground material  New chest pain or pain under the shoulder blades  Painful or persistently  difficult swallowing  New shortness of breath  Fever of 100F or higher  Black, tarry-looking stools  For urgent or emergent issues, a gastroenterologist can be reached at any hour by calling [phone]. Do not use MyChart messaging for urgent concerns.    DIET:  We do recommend a small meal at first, but then you may proceed to your regular diet.  Drink plenty of fluids but you should avoid alcoholic beverages for 24 hours.  ACTIVITY:  You should plan to take it easy for the rest of today and you should NOT DRIVE or use heavy machinery until tomorrow (because of the sedation medicines used during the test).    FOLLOW UP: Our staff will call the number listed on your records the next business day following your procedure.  We will call around 7:15- 8:00 am to check on you and address any questions or concerns that you may have regarding the information given to you following your procedure. If we do not reach you, we will leave a message.     If any biopsies were taken you will be contacted by phone or by letter within the next 1-3 weeks.  Please call us at [phone] if you have not heard about the biopsies in 3 weeks.    SIGNATURES/CONFIDENTIALITY: You and/or your care partner have signed paperwork which will be entered into your electronic medical record.  These signatures attest to the fact that that the information above on your After Visit Summary has been reviewed and  is understood.  Full responsibility of the confidentiality of this discharge information lies with you and/or your care-partner.

## 2023-05-20 NOTE — Progress Notes (Signed)
History & Physical  Primary Care Physician:  Shelva Majestic, MD Primary Gastroenterologist: Claudette Head, MD  Impression / Plan:  Personal history of adenomatous and sessile serrated colon polyps, recently resolved sigmoid colon diverticulitis, GERD and dysphagia for colonoscopy and EGD.  CHIEF COMPLAINT: Dysphagia, GERD, personal history of colon polyps, recently resolved sigmoid colon diverticulitis  HPI: Kelly Frazier is a 77 y.o. female Personal history of adenomatous and sessile serrated colon polyps, recently resolved sigmoid colon diverticulitis, GERD and dysphagia for colonoscopy and EGD.   Past Medical History:  Diagnosis Date   Allergy    Arthritis    OA   Cancer (HCC) 1980's   basal cell carcinoma   Cataract    bilateral 2 yrs ago    Diverticulosis    Excessive daytime sleepiness 06/05/2016   GERD (gastroesophageal reflux disease)    Head injury due to trauma    s/p attack - some memory loss    Headache(784.0)    Hx of colonic polyps    Hyperlipidemia    no meds    Hypertension    pt denies- on  meds    Thyroid disease     Past Surgical History:  Procedure Laterality Date   AXILLARY ABCESS IRRIGATION AND DEBRIDEMENT     cyst   bcca from eyelid     basal cell 1980s.    BREAST BIOPSY  2006   and 2017   CATARACT EXTRACTION, BILATERAL     COLONOSCOPY  08/17/06   NSVD  1971   x1   osseous implant     POLYPECTOMY     removal of moles and denmatoid      Prior to Admission medications   Medication Sig Start Date End Date Taking? Authorizing Provider  Ascorbic Acid (VITAMIN C WITH ROSE HIPS) 500 MG tablet Take 500 mg by mouth daily.   Yes [provider]  atorvastatin (LIPITOR) 10 MG tablet TAKE 1 TABLET BY MOUTH EVERY DAY 01/13/23  Yes Shelva Majestic, MD  Biotin 10 MG CHEW Chew by mouth.   Yes [provider]  bisoprolol-hydrochlorothiazide (ZIAC) 2.5-6.25 MG tablet Take 2 tablets by mouth daily. Has extra at home- is taking 2  until she runs out and then we can send in new rx 01/13/23  Yes Shelva Majestic, MD  CALCIUM-MAGNESIUM-ZINC PO Take by mouth.   Yes [provider]  Cholecalciferol (VITAMIN D3) 10 MCG (400 UNIT) CAPS Take 10 mcg by mouth.   Yes [provider]  Clobetasol Prop Emollient Base (CLOBETASOL PROPIONATE E) 0.05 % emollient cream Apply 1 Application topically at bedtime. 09/18/22  Yes Selmer Dominion, NP  clotrimazole (LOTRIMIN) 1 % cream Apply 1 Application topically 2 (two) times daily.   Yes [provider]  co-enzyme Q-10 30 MG capsule Take 200 mg by mouth daily.   Yes [provider]  hydrocortisone 2.5 % cream Apply topically in the morning and at bedtime. 06/08/20  Yes [provider]  levothyroxine (SYNTHROID) 75 MCG tablet TAKE 1 TABLET BY MOUTH EVERY DAY 01/13/23  Yes Shelva Majestic, MD  Multiple Vitamins-Minerals (WOMENS 50+ ADVANCED PO) Take 2 tablets by mouth daily.   Yes [provider]  omeprazole (PRILOSEC) 20 MG capsule TAKE 1-2 CAPSULES BY MOUTH EVERY DAY 11/21/22  Yes Shelva Majestic, MD  Probiotic Product (CULTURELLE PROBIOTICS PO) Take by mouth. Garden of life   Yes [provider]  Probiotic Product (PROBIOTIC DAILY PO) Take by mouth.  Garden of life women over 50 raw probiotics   Yes [provider]  vitamin B-12 (CYANOCOBALAMIN) 1000 MCG tablet Take 1,000 mcg by mouth daily.   Yes [provider]  diclofenac sodium (VOLTAREN) 1 % GEL Apply topically to affected area qid 03/23/19   Shelva Majestic, MD  EPINEPHrine [facility] JR) 0.15 MG/0.3ML injection Inject 0.15 mg into the muscle as needed. Patient not taking: Reported on 05/20/2023    [provider]    Current Outpatient Medications  Medication Sig Dispense Refill   Ascorbic Acid (VITAMIN C WITH ROSE HIPS) 500 MG tablet Take 500 mg by mouth daily.     atorvastatin (LIPITOR) 10 MG tablet TAKE 1 TABLET BY MOUTH EVERY DAY 90 tablet 2    Biotin 10 MG CHEW Chew by mouth.     bisoprolol-hydrochlorothiazide (ZIAC) 2.5-6.25 MG tablet Take 2 tablets by mouth daily. Has extra at home- is taking 2 until she runs out and then we can send in new rx 1 tablet 0   CALCIUM-MAGNESIUM-ZINC PO Take by mouth.     Cholecalciferol (VITAMIN D3) 10 MCG (400 UNIT) CAPS Take 10 mcg by mouth.     Clobetasol Prop Emollient Base (CLOBETASOL PROPIONATE E) 0.05 % emollient cream Apply 1 Application topically at bedtime. 30 g 11   clotrimazole (LOTRIMIN) 1 % cream Apply 1 Application topically 2 (two) times daily.     co-enzyme Q-10 30 MG capsule Take 200 mg by mouth daily.     hydrocortisone 2.5 % cream Apply topically in the morning and at bedtime.     levothyroxine (SYNTHROID) 75 MCG tablet TAKE 1 TABLET BY MOUTH EVERY DAY 90 tablet 2   Multiple Vitamins-Minerals (WOMENS 50+ ADVANCED PO) Take 2 tablets by mouth daily.     omeprazole (PRILOSEC) 20 MG capsule TAKE 1-2 CAPSULES BY MOUTH EVERY DAY 180 capsule 3   Probiotic Product (CULTURELLE PROBIOTICS PO) Take by mouth. Garden of life     Probiotic Product (PROBIOTIC DAILY PO) Take by mouth. Garden of life women over 50 raw probiotics     vitamin B-12 (CYANOCOBALAMIN) 1000 MCG tablet Take 1,000 mcg by mouth daily.     diclofenac sodium (VOLTAREN) 1 % GEL Apply topically to affected area qid 100 g 1   EPINEPHrine (EPIPEN JR) 0.15 MG/0.3ML injection Inject 0.15 mg into the muscle as needed. (Patient not taking: Reported on 05/20/2023)     Current Facility-Administered Medications  Medication Dose Route Frequency Provider Last Rate Last Admin   0.9 %  sodium chloride infusion  500 mL Intravenous Once Meryl Dare, MD        Allergies as of 05/20/2023 - Review Complete 05/20/2023  Allergen Reaction Noted   Naprosyn [naproxen] Swelling 03/24/2011   Terbinafine hcl Swelling     Family History  Problem Relation Age of Onset   Cancer - Lung Mother        lung- nonsmoker. in her 75s, perhaps related to  job   Hypertension Mother    Lung cancer Mother    Liver disease Father    Alcohol abuse Father    Hypertension Father    Ovarian cancer Maternal Grandmother    Hodgkin's lymphoma Maternal Grandfather    Dementia Paternal Grandmother    Tuberculosis Paternal Grandfather    Colon cancer Maternal Aunt    Colon cancer Maternal Uncle    Colon polyps Neg Hx    Esophageal cancer Neg Hx    Rectal cancer Neg Hx  Stomach cancer Neg Hx    Pancreatic cancer Neg Hx     Social History   Socioeconomic History   Marital status: Single    Spouse name: Not on file   Number of children: Not on file   Years of education: Not on file   Highest education level: Bachelor's degree (e.g., BA, AB, BS)  Occupational History    Comment: [employer] 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 1 son. 2 twin grandkids age 14 in 55- in [place].   Retired from [employer].   Works with Psychologist, sport and exercise for [employer].  Left handed Drinks caffeine one story   Social Determinants of Health   Financial Resource Strain: Medium Risk (04/23/2023)   Overall Financial Resource Strain (CARDIA)    Difficulty of Paying Living Expenses: Somewhat hard  Food Insecurity: No Food Insecurity (04/23/2023)   Hunger Vital Sign    Worried About Running Out of Food in the Last Year: Never true    Ran Out of Food in the Last Year: Never true  Transportation Needs: No Transportation Needs (04/23/2023)   PRAPARE - Administrator, Civil Service (Medical): No    Lack of Transportation (Non-Medical): No  Physical  Activity: Inactive (04/23/2023)   Exercise Vital Sign    Days of Exercise per Week: 0 days    Minutes of Exercise per Session: 0 min  Stress: Stress Concern Present (04/23/2023)   [employer] of Occupational Health - Occupational Stress Questionnaire    Feeling of Stress : To some extent  Social Connections: Moderately Integrated (04/23/2023)   Social Connection and Isolation Panel [NHANES]    Frequency of Communication with Friends and Family: More than three times a week    Frequency of Social Gatherings with Friends and Family: More than three times a week    Attends Religious Services: 1 to 4 times per year    Active Member of [employer] or Organizations: Yes    Attends Engineer, structural: More than 4 times per year    Marital Status: Divorced  Intimate Partner Violence: Not At Risk (04/27/2023)   Humiliation, Afraid, Rape, and Kick questionnaire    Fear of Current or Ex-Partner: No    Emotionally Abused: No    Physically Abused: No    Sexually Abused: No    Review of Systems:  All systems reviewed were negative except where noted in HPI.   Physical Exam:  General:  Alert, well-developed, in NAD Head:  Normocephalic and atraumatic. Eyes:  Sclera clear, no icterus.   Conjunctiva pink. Ears:  Normal auditory acuity. Mouth:  No deformity or lesions.  Neck:  Supple; no masses. Lungs:  Clear throughout to auscultation.   No wheezes, crackles, or rhonchi.  Heart:  Regular rate and rhythm; no murmurs. Abdomen:  Soft, nondistended, nontender. No masses, hepatomegaly. No palpable masses.  Normal bowel sounds.    Rectal:  Deferred   Msk:  Symmetrical  without gross deformities. Extremities:  Without edema. Neurologic:  Alert and  oriented x 4; grossly normal neurologically. Skin:  Intact without significant lesions or rashes. Psych:  Alert and cooperative. Normal mood and affect.   Venita Lick. Russella Dar  05/20/2023, 2:11 PM See Loretha Stapler, [facility], to contact our on call  provider

## 2023-05-20 NOTE — Op Note (Signed)
[facility] Patient Name: Kelly Frazier Procedure Date: 05/20/2023 2:14 PM MRN: [id number] Endoscopist: Meryl Dare , MD, [phone] Age: 77 Referring MD:  Date of Birth: 1946/06/09 Gender: Female Account #: [id number] Procedure:                Colonoscopy Indications:              Surveillance: Personal history of adenomatous and                            sessile serrated polyps on last colonoscopy > 5                            years ago, Recently resolved diverticulitis Medicines:                Monitored Anesthesia Care Procedure:                Pre-Anesthesia Assessment:                           - Prior to the procedure, a History and Physical                            was performed, and patient medications and                            allergies were reviewed. The patient's tolerance of                            previous anesthesia was also reviewed. The risks                            and benefits of the procedure and the sedation                            options and risks were discussed with the patient.                            All questions were answered, and informed consent                            was obtained. Prior Anticoagulants: The patient has                            taken no anticoagulant or antiplatelet agents. ASA                            Grade Assessment: II - A patient with mild systemic                            disease. After reviewing the risks and benefits,                            the patient was deemed in satisfactory condition to  undergo the procedure.                           After obtaining informed consent, the colonoscope                            was passed under direct vision. Throughout the                            procedure, the patient's blood pressure, pulse, and                            oxygen saturations were monitored continuously. The                            Olympus Scope  SN: [id number] was introduced through                            the anus and advanced to the the cecum, identified                            by appendiceal orifice and ileocecal valve. The                            ileocecal valve, appendiceal orifice, and rectum                            were photographed. The quality of the bowel                            preparation was good. The colonoscopy was performed                            without difficulty. The patient tolerated the                            procedure well. Scope In: 2:25:44 PM Scope Out: 2:39:24 PM Scope Withdrawal Time: 0 hours 10 minutes 47 seconds  Total Procedure Duration: 0 hours 13 minutes 40 seconds  Findings:                 The perianal and digital rectal examinations were                            normal.                           A 6 mm polyp was found in the transverse colon. The                            polyp was sessile. The polyp was removed with a                            cold snare. Resection and retrieval were complete.  Multiple medium-mouthed and small-mouthed                            diverticula were found in the left colon. There was                            narrowing of the colon in association with the                            diverticular opening. There was no evidence of                            diverticular bleeding.                           External and internal hemorrhoids were found during                            retroflexion. The hemorrhoids were small and Grade                            I (internal hemorrhoids that do not prolapse).                           The exam was otherwise without abnormality on                            direct and retroflexion views. Complications:            No immediate complications. Estimated blood loss:                            None. Estimated Blood Loss:     Estimated blood loss: none. Impression:                - One 6 mm polyp in the transverse colon, removed                            with a cold snare. Resected and retrieved.                           - Moderate diverticulosis in the left colon.                           - External and internal hemorrhoids.                           - The examination was otherwise normal on direct                            and retroflexion views. Recommendation:           - Patient has a contact number available for                            emergencies. The signs and symptoms of  potential                            delayed complications were discussed with the                            patient. Return to normal activities tomorrow.                            Written discharge instructions were provided to the                            patient.                           - Resume previous diet adding increased fiber.                           - Continue present medications.                           - Await pathology results. Meryl Dare, MD 05/20/2023 2:43:37 PM This report has been signed electronically.

## 2023-05-20 NOTE — Op Note (Signed)
[facility] Patient Name: Kelly Frazier Procedure Date: 05/20/2023 2:14 PM MRN: [id number] Endoscopist: Meryl Dare , MD, [phone] Age: 77 Referring MD:  Date of Birth: September 18, 1945 Gender: Female Account #: [id number] Procedure:                Upper GI endoscopy Indications:              Dysphagia, Gastroesophageal reflux disease Medicines:                Monitored Anesthesia Care Procedure:                Pre-Anesthesia Assessment:                           - Prior to the procedure, a History and Physical                            was performed, and patient medications and                            allergies were reviewed. The patient's tolerance of                            previous anesthesia was also reviewed. The risks                            and benefits of the procedure and the sedation                            options and risks were discussed with the patient.                            All questions were answered, and informed consent                            was obtained. Prior Anticoagulants: The patient has                            taken no anticoagulant or antiplatelet agents. ASA                            Grade Assessment: III - A patient with severe                            systemic disease. After reviewing the risks and                            benefits, the patient was deemed in satisfactory                            condition to undergo the procedure.                           After obtaining informed consent, the endoscope was  passed under direct vision. Throughout the                            procedure, the patient's blood pressure, pulse, and                            oxygen saturations were monitored continuously. The                            Olympus scope [id number] was introduced through the                            mouth, and advanced to the second part of duodenum.                            The  patient tolerated the procedure well. Scope In: Scope Out: Findings:                 No endoscopic abnormality was evident in the                            esophagus to explain the patient's complaint of                            dysphagia. It was decided, however, to proceed with                            dilation of the entire esophagus. A guidewire was                            placed and the scope was withdrawn. Dilation was                            performed with a Savary dilator with no resistance                            at 17 mm.                           A small hiatal hernia was present.                           Multiple 4 to 10 mm sessile polyps with no bleeding                            and no stigmata of recent bleeding were found in                            the gastric fundus and in the gastric body.                            Sampling biopsies of the larger polyps were taken  with a cold forceps for histology.                           A single 8 mm semi-pedunculated polyp with no                            bleeding and no stigmata of recent bleeding was                            found on the greater curvature of the gastric body.                            The polyp was removed with a cold snare. Resection                            and retrieval were complete.                           A single 6 mm sessile polyp with no bleeding and no                            stigmata of recent bleeding was found on the                            anterior wall of the gastric antrum. The polyp was                            removed with a cold snare. Resection and retrieval                            were complete.                           The exam of the stomach was otherwise normal.                           The duodenal bulb and second portion of the                            duodenum were normal. Complications:            No immediate  complications. Estimated Blood Loss:     Estimated blood loss was minimal. Impression:               - No endoscopic esophageal abnormality to explain                            patient's dysphagia. Esophagus dilated.                           - Small hiatal hernia.                           - Multiple gastric polyps. Biopsied.                           -  A single gastric body polyp. Resected and                            retrieved.                           - A single gastric antrum polyp. Resected and                            retrieved.                           - Normal duodenal bulb and second portion of the                            duodenum. Recommendation:           - Patient has a contact number available for                            emergencies. The signs and symptoms of potential                            delayed complications were discussed with the                            patient. Return to normal activities tomorrow.                            Written discharge instructions were provided to the                            patient.                           - Clear liquid diet for 2 hours, then advance as                            tolerated to soft diet today.                           - Resume prior diet tomorrow.                           - Follow antireflux measures.                           - Continue present medications.                           - Await pathology results. Meryl Dare, MD 05/20/2023 3:02:27 PM This report has been signed electronically.

## 2023-05-20 NOTE — Progress Notes (Signed)
Called to room to assist during endoscopic procedure.  Patient ID and intended procedure confirmed with present staff. Received instructions for my participation in the procedure from the performing physician.  

## 2023-05-21 ENCOUNTER — Telehealth: Payer: Self-pay | Admitting: *Deleted

## 2023-05-21 NOTE — Telephone Encounter (Signed)
Post procedure follow up call placed, no answer and left VM.  

## 2023-06-01 ENCOUNTER — Encounter: Payer: Self-pay | Admitting: Gastroenterology

## 2023-06-04 DIAGNOSIS — Z23 Encounter for immunization: Secondary | ICD-10-CM | POA: Diagnosis not present

## 2023-06-09 ENCOUNTER — Ambulatory Visit (INDEPENDENT_AMBULATORY_CARE_PROVIDER_SITE_OTHER): Payer: Medicare Other | Admitting: Family Medicine

## 2023-06-09 ENCOUNTER — Encounter: Payer: Self-pay | Admitting: Family Medicine

## 2023-06-09 VITALS — BP 130/72 | HR 62 | Temp 97.9°F | Ht 62.0 in | Wt 170.2 lb

## 2023-06-09 DIAGNOSIS — E785 Hyperlipidemia, unspecified: Secondary | ICD-10-CM | POA: Diagnosis not present

## 2023-06-09 DIAGNOSIS — I10 Essential (primary) hypertension: Secondary | ICD-10-CM | POA: Diagnosis not present

## 2023-06-09 DIAGNOSIS — E039 Hypothyroidism, unspecified: Secondary | ICD-10-CM | POA: Diagnosis not present
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
## 2023-06-09 NOTE — Progress Notes (Signed)
Phone [phone] In person visit   Subjective:   Kelly Frazier is a 77 y.o. year old very pleasant female patient who presents for/with See problem oriented charting Chief Complaint  Patient presents with   Medical Management of Chronic Issues    Has had recent colonoscopy and endoscopy. Still experiencing issues.   Hyperlipidemia   Hypertension   Past Medical History-  Patient Active Problem List   Diagnosis Date Noted   Aortic atherosclerosis (HCC) 04/27/2021    Priority: Medium    Vitamin B 12 deficiency 09/22/2018    Priority: Medium    Solitary pulmonary nodule 01/10/2017    Priority: Medium    Migraine 08/17/2014    Priority: Medium    Hyperlipidemia 05/03/2007    Priority: Medium    Essential hypertension 05/03/2007    Priority: Medium    Hypothyroidism 04/12/2007    Priority: Medium    Degenerative arthritis of knee, bilateral 02/18/2019    Priority: Low   Arthralgia of both knees 11/13/2017    Priority: Low   Trigger finger, left 08/03/2017    Priority: Low   Esophageal reflux 09/03/2010    Priority: Low   Angioedema 06/21/2010    Priority: Low   History of colonic polyps 09/27/2007    Priority: Low   Osteoarthritis 04/12/2007    Priority: Low   Injury of tendon of biceps 12/23/2022    Priority: 1.   Greater trochanteric bursitis of left hip 11/21/2022    Priority: 1.   Plantar fasciitis 09/26/2022    Priority: 1.   Arthritis of carpometacarpal [facility]) joint of both thumbs 10/28/2021    Priority: 1.   Left shoulder pain 02/06/2021    Priority: 1.   AC (acromioclavicular) arthritis 02/06/2021    Priority: 1.   Hematoma of left hip 12/26/2020    Priority: 1.   Snoring 06/05/2016   Excessive daytime sleepiness 06/05/2016   Shortness of breath 03/11/2016    Medications- reviewed and updated Current Outpatient Medications  Medication Sig Dispense Refill   Ascorbic Acid (VITAMIN C WITH ROSE HIPS) 500 MG tablet Take 500 mg by mouth daily.      atorvastatin (LIPITOR) 10 MG tablet TAKE 1 TABLET BY MOUTH EVERY DAY 90 tablet 2   Biotin 10 MG CHEW Chew by mouth.     bisoprolol-hydrochlorothiazide (ZIAC) 2.5-6.25 MG tablet Take 2 tablets by mouth daily. Has extra at home- is taking 2 until she runs out and then we can send in new rx 1 tablet 0   CALCIUM-MAGNESIUM-ZINC PO Take by mouth.     Cholecalciferol (VITAMIN D3) 10 MCG (400 UNIT) CAPS Take 10 mcg by mouth.     Clobetasol Prop Emollient Base (CLOBETASOL PROPIONATE E) 0.05 % emollient cream Apply 1 Application topically at bedtime. 30 g 11   clotrimazole (LOTRIMIN) 1 % cream Apply 1 Application topically 2 (two) times daily.     co-enzyme Q-10 30 MG capsule Take 200 mg by mouth daily.     diclofenac sodium (VOLTAREN) 1 % GEL Apply topically to affected area qid 100 g 1   EPINEPHrine (EPIPEN JR) 0.15 MG/0.3ML injection Inject 0.15 mg into the muscle as needed.     hydrocortisone 2.5 % cream Apply topically in the morning and at bedtime.     levothyroxine (SYNTHROID) 75 MCG tablet TAKE 1 TABLET BY MOUTH EVERY DAY 90 tablet 2   Multiple Vitamins-Minerals (WOMENS 50+ ADVANCED PO) Take 2 tablets by mouth daily.     omeprazole (PRILOSEC)  20 MG capsule TAKE 1-2 CAPSULES BY MOUTH EVERY DAY 180 capsule 3   Probiotic Product (CULTURELLE PROBIOTICS PO) Take by mouth. Garden of life     Probiotic Product (PROBIOTIC DAILY PO) Take by mouth. Garden of life women over 50 raw probiotics     vitamin B-12 (CYANOCOBALAMIN) 1000 MCG tablet Take 1,000 mcg by mouth daily.     No current facility-administered medications for this visit.     Objective:  BP 130/72   Pulse 62   Temp 97.9 F (36.6 C)   Ht 5\' 2"  (1.575 m)   Wt 170 lb 3.2 oz (77.2 kg)   SpO2 97%   BMI 31.13 kg/m  Gen: NAD, resting comfortably CV: RRR no murmurs rubs or gallops Lungs: CTAB no crackles, wheeze, rhonchi Abdomen: soft/nontender/nondistended/normal bowel sounds. No rebound or guarding.  Ext: no edema Skin: warm, dry     Assessment and Plan   # GI concerns including dysphagia (see note for 3024-bloating/left lower quadrant pain/change in stool habits but did have diverticulitis around that time and left lower quadrant pain at least improved with Augmentin)-recent colonoscopy and endoscopy with still having some issues - today reports occasional mild pain LLQ but nothing like before. Still having some bloating issues. Still mild trouble swallowing even with dilation of esophagus. Stools have gotten better.   #hyperlipidemia/aortic atherosclerosis-LDL goal under 70 S: Medication: Atorvastatin 10 mg Lab Results  Component Value Date   CHOL 133 01/13/2023   HDL 44.60 01/13/2023   LDLCALC 70 01/13/2023   LDLDIRECT 88.0 10/18/2021   TRIG 91.0 01/13/2023   CHOLHDL 3 01/13/2023  A/P: stable- continue current medicines   #hypothyroidism S: compliant On thyroid medication- Synthroid   Lab Results  Component Value Date   TSH 0.78 04/29/2023  A/P:stable- continue current medicines    #hypertension S: medication: Ziac 2.5-6.25Mg  (takes 2)  BP Readings from Last 3 Encounters:  06/09/23 130/72  05/20/23 (!) 107/52  04/29/23 112/72  A/P: stable- continue current medicines   # B12 deficiency S: Current treatment/medication (oral vs. IM): Vitamin b12 Lab Results  Component Value Date   VITAMINB12 952 (H) 04/29/2023  A/P: reasonable control- continue current medications    # Hyperglycemia/insulin resistance/prediabetes-A1c of 5.7 in 2017 S:  Medication: None Lab Results  Component Value Date   HGBA1C 5.8 01/13/2023   HGBA1C 5.7 02/21/2016  A/P: hopefully stable- recheck next visit  #mild white blood cell(s) elevation- repeat next visit. Years ago had similar and did pathologist smear review but improved on repeat  #Meningioma followed by [facility]-most recently 01/23/2023- about 6 month follow up planned but yearly after that likely  Recommended follow up: Return in about 6 months (around  12/07/2023) for followup or sooner if needed.Schedule b4 you leave. Future Appointments  Date Time Provider Department Center  06/10/2023  2:15 PM Judi Saa, DO [facility] None  05/02/2024  3:00 PM [facility] ANNUAL WELLNESS VISIT 1 [facility]    Lab/Order associations: No diagnosis found.  No orders of the defined types were placed in this encounter.   Return precautions advised.  Tana Conch, MD

## 2023-06-09 NOTE — Patient Instructions (Addendum)
Glad you are doing reasonably well. Thank you for all your service!   Recommended follow up: Return in about 6 months (around 12/07/2023) for followup or sooner if needed.Schedule b4 you leave.

## 2023-06-10 ENCOUNTER — Ambulatory Visit: Payer: Medicare Other | Admitting: Family Medicine

## 2023-06-10 ENCOUNTER — Encounter: Payer: Self-pay | Admitting: Family Medicine

## 2023-06-10 ENCOUNTER — Other Ambulatory Visit: Payer: Self-pay

## 2023-06-10 VITALS — BP 114/72 | HR 70 | Ht 62.0 in | Wt 169.0 lb

## 2023-06-10 DIAGNOSIS — G8929 Other chronic pain: Secondary | ICD-10-CM

## 2023-06-10 DIAGNOSIS — M7061 Trochanteric bursitis, right hip: Secondary | ICD-10-CM

## 2023-06-10 DIAGNOSIS — M25512 Pain in left shoulder: Secondary | ICD-10-CM | POA: Diagnosis not present

## 2023-06-10 NOTE — Patient Instructions (Addendum)
GT exercises See me again in 8 weeks

## 2023-06-10 NOTE — Assessment & Plan Note (Signed)
Patient given injection and tolerated the procedure well, discussed icing regimen and home exercises, discussed icing regimen hip abductor strengthening patient will be very active, discussed proper shoes.  Follow-up again in 6 to 8 weeks

## 2023-07-09 DIAGNOSIS — L814 Other melanin hyperpigmentation: Secondary | ICD-10-CM | POA: Diagnosis not present

## 2023-07-09 DIAGNOSIS — B351 Tinea unguium: Secondary | ICD-10-CM | POA: Diagnosis not present

## 2023-07-09 DIAGNOSIS — L821 Other seborrheic keratosis: Secondary | ICD-10-CM | POA: Diagnosis not present

## 2023-07-09 DIAGNOSIS — D1801 Hemangioma of skin and subcutaneous tissue: Secondary | ICD-10-CM | POA: Diagnosis not present

## 2023-07-09 DIAGNOSIS — Z129 Encounter for screening for malignant neoplasm, site unspecified: Secondary | ICD-10-CM | POA: Diagnosis not present

## 2023-07-09 DIAGNOSIS — Z85828 Personal history of other malignant neoplasm of skin: Secondary | ICD-10-CM | POA: Diagnosis not present
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
## 2023-08-05 ENCOUNTER — Ambulatory Visit (INDEPENDENT_AMBULATORY_CARE_PROVIDER_SITE_OTHER): Payer: Medicare Other | Admitting: Family Medicine

## 2023-08-05 ENCOUNTER — Ambulatory Visit (INDEPENDENT_AMBULATORY_CARE_PROVIDER_SITE_OTHER): Payer: Medicare Other

## 2023-08-05 VITALS — BP 112/82 | HR 72 | Ht 62.0 in | Wt 169.0 lb

## 2023-08-05 DIAGNOSIS — M545 Low back pain, unspecified: Secondary | ICD-10-CM | POA: Diagnosis not present

## 2023-08-05 DIAGNOSIS — M51362 Other intervertebral disc degeneration, lumbar region with discogenic back pain and lower extremity pain: Secondary | ICD-10-CM | POA: Diagnosis not present

## 2023-08-05 DIAGNOSIS — M48061 Spinal stenosis, lumbar region without neurogenic claudication: Secondary | ICD-10-CM | POA: Diagnosis not present

## 2023-08-05 DIAGNOSIS — M47816 Spondylosis without myelopathy or radiculopathy, lumbar region: Secondary | ICD-10-CM | POA: Diagnosis not present

## 2023-08-05 DIAGNOSIS — M419 Scoliosis, unspecified: Secondary | ICD-10-CM | POA: Diagnosis not present

## 2023-08-05 NOTE — Patient Instructions (Signed)
Back exercises Lumbar xray Think about gabapentin See me in 2 months

## 2023-08-06 ENCOUNTER — Encounter: Payer: Self-pay | Admitting: Family Medicine

## 2023-08-06 DIAGNOSIS — M51369 Other intervertebral disc degeneration, lumbar region without mention of lumbar back pain or lower extremity pain: Secondary | ICD-10-CM | POA: Insufficient documentation

## 2023-08-06 NOTE — Assessment & Plan Note (Signed)
I believe that some of the pain that patient is having in the hip is secondary to radiation to the back at this moment with patient not responding quite as well.  Discussed with patient about icing regimen and home exercises, which activities to do and which ones to avoid, increase activity slowly.  Follow-up again in 6 to 8 weeks.  At that time if worsening symptoms or weakness of the lower extremities we can consider the possibility advanced imaging.

## 2023-08-18 DIAGNOSIS — Z23 Encounter for immunization: Secondary | ICD-10-CM | POA: Diagnosis not present

## 2023-09-30 NOTE — Progress Notes (Signed)
[name] [address] [place] [address] Phone: [phone] Subjective:   [name], am serving as a scribe for Dr. Antoine Primas.  I'm seeing this patient by the request  of:  Shelva Majestic, MD  CC: Low back pain  [id number]  08/05/2023 I believe that some of the pain that patient is having in the hip is secondary to radiation to the back at this moment with patient not responding quite as well.  Discussed with patient about icing regimen and home exercises, which activities to do and which ones to avoid, increase activity slowly.  Follow-up again in 6 to 8 weeks.  At that time if worsening symptoms or weakness of the lower extremities we can consider the possibility advanced imaging.      Update 10/01/2023 Kelly Frazier is a 78 y.o. female coming in with complaint of lumbar spine pain. Back pain has been manageable.   Patient states that she continues to have problems with the R lateral hip.   Also c/o L knee weakness. Pain is over medial aspect.       Past Medical History:  Diagnosis Date   Allergy    Arthritis    OA   Cancer (HCC) 1980's   basal cell carcinoma   Cataract    bilateral 2 yrs ago    Diverticulosis    Excessive daytime sleepiness 06/05/2016   GERD (gastroesophageal reflux disease)    Head injury due to trauma    s/p attack - some memory loss    Headache(784.0)    Hx of colonic polyps    Hyperlipidemia    no meds    Hypertension    pt denies- on  meds    Thyroid disease    Past Surgical History:  Procedure Laterality Date   AXILLARY ABCESS IRRIGATION AND DEBRIDEMENT     cyst   bcca from eyelid     basal cell 1980s.    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 1 son. 2 twin grandkids age 60 in 56- in [place].   Retired from [place].   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  Left handed Drinks caffeine one story   Social Drivers of Health   Financial Resource Strain: Medium Risk (04/23/2023)   Overall Physicist, medical Strain (  CARDIA)    Difficulty of Paying Living Expenses: Somewhat hard  Food Insecurity: No Food Insecurity (04/23/2023)   Hunger Vital Sign    Worried About Running Out of Food in the Last Year: Never true    Ran Out of Food in the Last Year: Never true  Transportation Needs: No Transportation Needs (04/23/2023)   PRAPARE - Administrator, Civil Service (Medical): No    Lack of Transportation (Non-Medical): No  Physical Activity: Inactive (04/23/2023)   Exercise Vital Sign    Days of Exercise per Week: 0 days    Minutes of Exercise per Session: 0 min  Stress: Stress Concern Present (04/23/2023)   [employer] of Occupational Health - Occupational Stress Questionnaire    Feeling of Stress : To some extent  Social Connections: Moderately Integrated (04/23/2023)   Social Connection and Isolation Panel [NHANES]    Frequency of Communication with Friends and Family: More than three times a week    Frequency of Social Gatherings with Friends and Family: More than three times a week     Attends Religious Services: 1 to 4 times per year    Active Member of [employer] or Organizations: Yes    Attends Engineer, structural: More than 4 times per year    Marital Status: Divorced   Allergies  Allergen Reactions   Naprosyn [Naproxen] Swelling   Terbinafine Hcl Swelling    Lamisil    Family History  Problem Relation Age of Onset   Cancer - Lung Mother        lung- nonsmoker. in her 34s, perhaps related to job   Hypertension Mother    Lung cancer Mother    Liver disease Father    Alcohol abuse Father    Hypertension Father    Ovarian cancer Maternal Grandmother    Hodgkin's lymphoma Maternal Grandfather    Dementia Paternal Grandmother    Tuberculosis Paternal Grandfather    Colon cancer Maternal Aunt    Colon cancer Maternal Uncle    Colon polyps Neg Hx    Esophageal cancer Neg Hx    Rectal cancer Neg Hx    Stomach cancer Neg Hx    Pancreatic cancer Neg Hx     Current Outpatient Medications (Endocrine & Metabolic):    levothyroxine (SYNTHROID) 75 MCG tablet, TAKE 1 TABLET BY MOUTH EVERY DAY  Current Outpatient Medications (Cardiovascular):    atorvastatin (LIPITOR) 10 MG tablet, TAKE 1 TABLET BY MOUTH EVERY DAY   bisoprolol-hydrochlorothiazide (ZIAC) 2.5-6.25 MG tablet, Take 2 tablets by mouth daily. Has extra at home- is taking 2 until she runs out and then we can send in new rx    Current Outpatient Medications (Hematological):    vitamin B-12 (CYANOCOBALAMIN) 1000 MCG tablet, Take 1,000 mcg by mouth daily.  Current Outpatient Medications (Other):    Ascorbic Acid (VITAMIN C WITH ROSE HIPS) 500 MG tablet, Take 500 mg by mouth daily.   Biotin 10 MG CHEW, Chew by mouth.   CALCIUM-MAGNESIUM-ZINC PO, Take by mouth.   Cholecalciferol (VITAMIN D3) 10 MCG (400 UNIT) CAPS, Take 10 mcg by mouth.   Clobetasol Prop Emollient Base (CLOBETASOL PROPIONATE E) 0.05 % emollient cream, Apply 1 Application topically at bedtime.   clotrimazole (LOTRIMIN) 1 % cream,  Apply 1 Application topically 2 (two) times daily.   co-enzyme Q-10 30 MG capsule, Take 200 mg by mouth daily.   diclofenac sodium (VOLTAREN) 1 % GEL, Apply topically to affected area qid   hydrocortisone  2.5 % cream, Apply topically in the morning and at bedtime.   Multiple Vitamins-Minerals (WOMENS 50+ ADVANCED PO), Take 2 tablets by mouth daily.   omeprazole (PRILOSEC) 20 MG capsule, TAKE 1-2 CAPSULES BY MOUTH EVERY DAY   Probiotic Product (PROBIOTIC DAILY PO), Take by mouth. Garden of life women over 50 raw probiotics   Reviewed prior external information including notes and imaging from  primary care provider As well as notes that were available from care everywhere and other healthcare systems.  Past medical history, social, surgical and family history all reviewed in electronic medical record.  No pertanent information unless stated regarding to the chief complaint.   Review of Systems:  No headache, visual changes, nausea, vomiting, diarrhea, constipation, dizziness, abdominal pain, skin rash, fevers, chills, night sweats, weight loss, swollen lymph nodes, body aches, joint swelling, chest pain, shortness of breath, mood changes. POSITIVE muscle aches  Objective  Blood pressure 110/78, pulse 77, height 5\' 2"  (1.575 m), weight 170 lb (77.1 kg), SpO2 96%.   General: No apparent distress alert and oriented x3 mood and affect normal, dressed appropriately.  HEENT: Pupils equal, extraocular movements intact  Respiratory: Patient's speak in full sentences and does not appear short of breath  Cardiovascular: No lower extremity edema, non tender, no erythema   Low back exam shows mild loss lordosis noted.  Very minimal tenderness but severe tenderness noted over the greater trochanteric area.  Knee exam shows crepitus noted knees do have effusion noted bilaterally left greater than right.   After verbal consent patient was prepped with alcohol swab and with a 21-gauge 2 inch needle  injected into the right greater trochanteric area with 2 cc of 0.5% Marcaine and 1 cc of Kenalog 40 mg/mL.  No blood loss.  Band-Aid placed.  Postinjection instructions given  After informed written and verbal consent, patient was seated on exam table. Right knee was prepped with alcohol swab and utilizing anterolateral approach, patient's right knee space was injected with 4:1  marcaine 0.5%: Kenalog 40mg /dL. Patient tolerated the procedure well without immediate complications.  After informed written and verbal consent, patient was seated on exam table. Left knee was prepped with alcohol swab and utilizing anterolateral approach, patient's left knee space was injected with 4:1  marcaine 0.5%: Kenalog 40mg /dL. Patient tolerated the procedure well without immediate complications.   Impression and Recommendations:     The above documentation has been reviewed and is accurate and complete Judi Saa, DO

## 2023-10-01 ENCOUNTER — Ambulatory Visit: Payer: Medicare Other | Admitting: Family Medicine

## 2023-10-01 ENCOUNTER — Encounter: Payer: Self-pay | Admitting: Family Medicine

## 2023-10-01 VITALS — BP 110/78 | HR 77 | Ht 62.0 in | Wt 170.0 lb

## 2023-10-01 DIAGNOSIS — M170 Bilateral primary osteoarthritis of knee: Secondary | ICD-10-CM

## 2023-10-01 DIAGNOSIS — M7061 Trochanteric bursitis, right hip: Secondary | ICD-10-CM

## 2023-10-01 NOTE — Assessment & Plan Note (Signed)
Chronic problem with exacerbation.  Likely secondary to the compensation for the knee.  Hopefully patient responds.  Follow-up again in 6 to 8 weeks

## 2023-10-01 NOTE — Patient Instructions (Signed)
Good to see you  Injections in both knees Injection in right hip today Follow up in 3 months

## 2023-10-01 NOTE — Assessment & Plan Note (Signed)
Bilateral injection given today  Ice 20 minutes 2 times daily. Usually after activity and before bed. Discussed HEP  Increase activity as stated  Increase interval  Rtc in 8 week

## 2023-10-22 ENCOUNTER — Other Ambulatory Visit: Payer: Self-pay | Admitting: Family Medicine

## 2023-10-23 ENCOUNTER — Other Ambulatory Visit: Payer: Self-pay | Admitting: Obstetrics and Gynecology

## 2023-10-23 DIAGNOSIS — L280 Lichen simplex chronicus: Secondary | ICD-10-CM

## 2023-10-28 ENCOUNTER — Telehealth: Payer: Medicare Other | Admitting: Family Medicine

## 2023-10-28 ENCOUNTER — Encounter: Payer: Self-pay | Admitting: Family Medicine

## 2023-10-28 DIAGNOSIS — J101 Influenza due to other identified influenza virus with other respiratory manifestations: Secondary | ICD-10-CM

## 2023-10-28 MED ORDER — BENZONATATE 100 MG PO CAPS: 100.0000 mg | ORAL_CAPSULE | Freq: Three times a day (TID) | ORAL | 0 refills | Status: DC | PRN

## 2023-10-28 MED ORDER — OSELTAMIVIR PHOSPHATE 75 MG PO CAPS: 75.0000 mg | ORAL_CAPSULE | Freq: Two times a day (BID) | ORAL | 0 refills | Status: DC

## 2023-10-28 NOTE — Progress Notes (Signed)
MyChart Video Visit Virtual Visit via Video Note   This visit type was conducted w/patient consent. This format is felt to be most appropriate for this patient at this time. Physical exam was limited by quality of the video and audio technology used for the visit. CMA was able to get the patient set up on a video visit.  Patient location: Home. Patient and provider in visit Provider location: Office  I discussed the limitations of evaluation and management by telemedicine and the availability of in person appointments. The patient expressed understanding and agreed to proceed.  Visit Date: 10/28/2023  Today's healthcare provider: Angelena Sole, MD     Subjective:    Patient ID: Kelly Frazier, female    DOB: 08/18/1946, 78 y.o.   MRN: [id number]  Chief Complaint  Patient presents with   Cough    Hacking cough started Monday evening   Chills   Sore Throat    Started Monday morning   Nasal Congestion   Fatigue   Diarrhea    Negative Covid test about 30 minutes ago   Generalized Body Aches    Have been taking tylenol    HPI Scratchy throat Sunday noght Monday felt "horrible".  Achey, hacking cough, fatigue.  Tactile temps.  No sob.  Congested.  Got flu shot. Neg covid this am. Was w/friend in ER over weekend. Got RSV imm.    Past Medical History:  Diagnosis Date   Allergy    Arthritis    OA   Cancer (HCC) 1980's   basal cell carcinoma   Cataract    bilateral 2 yrs ago    Diverticulosis    Excessive daytime sleepiness 06/05/2016   GERD (gastroesophageal reflux disease)    Head injury due to trauma    s/p attack - some memory loss    Headache(784.0)    Hx of colonic polyps    Hyperlipidemia    no meds    Hypertension    pt denies- on  meds    Thyroid disease     Past Surgical History:  Procedure Laterality Date   AXILLARY ABCESS IRRIGATION AND DEBRIDEMENT     cyst   bcca from eyelid     basal cell 1980s.    BREAST BIOPSY  2006   and 2017   CATARACT  EXTRACTION, BILATERAL     COLONOSCOPY  08/17/06   NSVD  1971   x1   osseous implant     POLYPECTOMY     removal of moles and denmatoid      Outpatient Medications Prior to Visit  Medication Sig Dispense Refill   Ascorbic Acid (VITAMIN C WITH ROSE HIPS) 500 MG tablet Take 500 mg by mouth daily.     atorvastatin (LIPITOR) 10 MG tablet TAKE 1 TABLET BY MOUTH EVERY DAY 90 tablet 2   Biotin 10 MG CHEW Chew by mouth.     bisoprolol-hydrochlorothiazide (ZIAC) 2.5-6.25 MG tablet TAKE 1 TABLET BY MOUTH EVERY DAY 90 tablet 3   CALCIUM-MAGNESIUM-ZINC PO Take by mouth.     Cholecalciferol (VITAMIN D3) 10 MCG (400 UNIT) CAPS Take 10 mcg by mouth.     Clobetasol Prop Emollient Base (CLOBETASOL PROPIONATE E) 0.05 % emollient cream Apply 1 Application topically at bedtime. 30 g 11   clotrimazole (LOTRIMIN) 1 % cream Apply 1 Application topically 2 (two) times daily.     co-enzyme Q-10 30 MG capsule Take 200 mg by mouth daily.     diclofenac sodium (VOLTAREN)  1 % GEL Apply topically to affected area qid 100 g 1   hydrocortisone 2.5 % cream Apply topically in the morning and at bedtime.     levothyroxine (SYNTHROID) 75 MCG tablet TAKE 1 TABLET BY MOUTH EVERY DAY 90 tablet 2   Multiple Vitamins-Minerals (WOMENS 50+ ADVANCED PO) Take 2 tablets by mouth daily.     omeprazole (PRILOSEC) 20 MG capsule TAKE 1-2 CAPSULES BY MOUTH EVERY DAY 180 capsule 3   Probiotic Product (PROBIOTIC DAILY PO) Take by mouth. Garden of life women over 50 raw probiotics     vitamin B-12 (CYANOCOBALAMIN) 1000 MCG tablet Take 1,000 mcg by mouth daily.     No facility-administered medications prior to visit.    Allergies  Allergen Reactions   Naprosyn [Naproxen] Swelling   Terbinafine Hcl Swelling    Lamisil         Objective:     Physical Exam  Vitals and nursing note reviewed.  Constitutional:      General:  is not in acute distress.    Appearance: mildly ill.  congested  HENT:     Head: Normocephalic.   Pulmonary:     Effort: No respiratory distress.  Skin:    General: Skin is dry.     Coloration: Skin is not pale.  Neurological:     Mental Status: Pt is alert and oriented to person, place, and time.  Psychiatric:        Mood and Affect: Mood normal.   There were no vitals taken for this visit.  Wt Readings from Last 3 Encounters:  10/01/23 170 lb (77.1 kg)  08/05/23 169 lb (76.7 kg)  06/10/23 169 lb (76.7 kg)       Assessment & Plan:   Problem List Items Addressed This Visit   None Visit Diagnoses       Influenza A    -  Primary   Relevant Medications   oseltamivir (TAMIFLU) 75 MG capsule     By symptoms, presumtive flu.  Will do tamiflu 75mg  bid x 5 days.  Symptomatic tx.   Meds ordered this encounter  Medications   oseltamivir (TAMIFLU) 75 MG capsule    Sig: Take 1 capsule (75 mg total) by mouth 2 (two) times daily.    Dispense:  10 capsule    Refill:  0   benzonatate (TESSALON PERLES) 100 MG capsule    Sig: Take 1 capsule (100 mg total) by mouth 3 (three) times daily as needed.    Dispense:  20 capsule    Refill:  0    I discussed the assessment and treatment plan with the patient. The patient was provided an opportunity to ask questions and all were answered. The patient agreed with the plan and demonstrated an understanding of the instructions.   The patient was advised to call back or seek an in-person evaluation if the symptoms worsen or if the condition fails to improve as anticipated.  No follow-ups on file.  Angelena Sole, MD [facility] [phone] (phone) [phone] (fax)  [facility] [place] [facility]

## 2023-10-28 NOTE — Patient Instructions (Signed)
Meds sent

## 2023-10-29 NOTE — Telephone Encounter (Signed)
Patient has not been seen in a year but is scheduled for 2/25

## 2023-11-10 ENCOUNTER — Ambulatory Visit: Payer: Medicare Other | Admitting: Obstetrics and Gynecology

## 2023-11-22 ENCOUNTER — Other Ambulatory Visit: Payer: Self-pay | Admitting: Family Medicine

## 2023-11-23 ENCOUNTER — Other Ambulatory Visit: Payer: Self-pay | Admitting: Family Medicine

## 2023-11-25 DIAGNOSIS — Z961 Presence of intraocular lens: Secondary | ICD-10-CM | POA: Diagnosis not present

## 2023-11-25 DIAGNOSIS — H04123 Dry eye syndrome of bilateral lacrimal glands: Secondary | ICD-10-CM | POA: Diagnosis not present

## 2023-11-25 DIAGNOSIS — H53453 Other localized visual field defect, bilateral: Secondary | ICD-10-CM | POA: Diagnosis not present

## 2023-12-07 ENCOUNTER — Ambulatory Visit (INDEPENDENT_AMBULATORY_CARE_PROVIDER_SITE_OTHER): Payer: Medicare Other | Admitting: Family Medicine

## 2023-12-07 ENCOUNTER — Encounter: Payer: Self-pay | Admitting: Family Medicine

## 2023-12-07 VITALS — BP 118/64 | HR 64 | Temp 97.3°F | Ht 62.0 in | Wt 169.2 lb

## 2023-12-07 DIAGNOSIS — R739 Hyperglycemia, unspecified: Secondary | ICD-10-CM | POA: Diagnosis not present

## 2023-12-07 DIAGNOSIS — E039 Hypothyroidism, unspecified: Secondary | ICD-10-CM | POA: Diagnosis not present

## 2023-12-07 DIAGNOSIS — E538 Deficiency of other specified B group vitamins: Secondary | ICD-10-CM

## 2023-12-07 DIAGNOSIS — E785 Hyperlipidemia, unspecified: Secondary | ICD-10-CM

## 2023-12-07 DIAGNOSIS — Z131 Encounter for screening for diabetes mellitus: Secondary | ICD-10-CM | POA: Diagnosis not present

## 2023-12-07 DIAGNOSIS — I10 Essential (primary) hypertension: Secondary | ICD-10-CM

## 2023-12-07 NOTE — Progress Notes (Signed)
 Phone [phone] In person visit   Subjective:   Kelly Frazier is a 78 y.o. year old very pleasant female patient who presents for/with See problem oriented charting Chief Complaint  Patient presents with   Medical Management of Chronic Issues   Hyperlipidemia   Hypertension   Fatigue    Pt c/o continued fatigue after having the flu   Hip Pain    Pt c/o right hip pain that radiates to bra.    Past Medical History-  Patient Active Problem List   Diagnosis Date Noted   Aortic atherosclerosis (HCC) 04/27/2021    Priority: Medium    Vitamin B 12 deficiency 09/22/2018    Priority: Medium    Solitary pulmonary nodule 01/10/2017    Priority: Medium    Migraine 08/17/2014    Priority: Medium    Hyperlipidemia 05/03/2007    Priority: Medium    Essential hypertension 05/03/2007    Priority: Medium    Hypothyroidism 04/12/2007    Priority: Medium    Degenerative arthritis of knee, bilateral 02/18/2019    Priority: Low   Arthralgia of both knees 11/13/2017    Priority: Low   Trigger finger, left 08/03/2017    Priority: Low   Esophageal reflux 09/03/2010    Priority: Low   Angioedema 06/21/2010    Priority: Low   History of colonic polyps 09/27/2007    Priority: Low   Osteoarthritis 04/12/2007    Priority: Low   Injury of tendon of biceps 12/23/2022    Priority: 1.   Greater trochanteric bursitis of left hip 11/21/2022    Priority: 1.   Plantar fasciitis 09/26/2022    Priority: 1.   Arthritis of carpometacarpal [facility]) joint of both thumbs 10/28/2021    Priority: 1.   Left shoulder pain 02/06/2021    Priority: 1.   AC (acromioclavicular) arthritis 02/06/2021    Priority: 1.   Hematoma of left hip 12/26/2020    Priority: 1.   Degenerative disc disease, lumbar 08/06/2023   Greater trochanteric bursitis of right hip 06/10/2023   Snoring 06/05/2016   Excessive daytime sleepiness 06/05/2016   Shortness of breath 03/11/2016    Medications- reviewed and  updated Current Outpatient Medications  Medication Sig Dispense Refill   Ascorbic Acid (VITAMIN C WITH ROSE HIPS) 500 MG tablet Take 500 mg by mouth daily.     atorvastatin (LIPITOR) 10 MG tablet TAKE 1 TABLET BY MOUTH EVERY DAY 90 tablet 2   Biotin 10 MG CHEW Chew by mouth.     bisoprolol-hydrochlorothiazide (ZIAC) 2.5-6.25 MG tablet TAKE 1 TABLET BY MOUTH EVERY DAY 90 tablet 3   CALCIUM-MAGNESIUM-ZINC PO Take by mouth.     Cholecalciferol (VITAMIN D3) 10 MCG (400 UNIT) CAPS Take 10 mcg by mouth.     Clobetasol Prop Emollient Base (CLOBETASOL PROPIONATE E) 0.05 % emollient cream APPLY 1 APPLICATION TOPICALLY EVERY DAY AT BEDTIME 30 g 0   clotrimazole (LOTRIMIN) 1 % cream Apply 1 Application topically 2 (two) times daily.     co-enzyme Q-10 30 MG capsule Take 200 mg by mouth daily.     diclofenac sodium (VOLTAREN) 1 % GEL Apply topically to affected area qid 100 g 1   hydrocortisone 2.5 % cream Apply topically in the morning and at bedtime.     levothyroxine (SYNTHROID) 75 MCG tablet TAKE 1 TABLET BY MOUTH EVERY DAY 90 tablet 2   Multiple Vitamins-Minerals (WOMENS 50+ ADVANCED PO) Take 2 tablets by mouth daily.     omeprazole (  PRILOSEC) 20 MG capsule TAKE 1-2 CAPSULES BY MOUTH EVERY DAY 180 capsule 3   Probiotic Product (PROBIOTIC DAILY PO) Take by mouth. Garden of life women over 50 raw probiotics     vitamin B-12 (CYANOCOBALAMIN) 1000 MCG tablet Take 1,000 mcg by mouth daily.     No current facility-administered medications for this visit.     Objective:  BP 118/64   Pulse 64   Temp (!) 97.3 F (36.3 C)   Ht 5\' 2"  (1.575 m)   Wt 169 lb 3.2 oz (76.7 kg)   SpO2 97%   BMI 30.95 kg/m  Gen: NAD, resting comfortably CV: RRR no murmurs rubs or gallops Lungs: CTAB no crackles, wheeze, rhonchi Ext: no edema Skin: warm, dry     Assessment and Plan   #Fatigue- ongoing after influenza about 6 weeks ago - finally in last 2 days starting to get some energy back. Cough/congestion for 4  weeks.  Medicine helped but still lingered - ok with checkings labs  #hyperlipidemia/aortic atherosclerosis-LDL goal under 70 S: Medication: Atorvastatin 10 mg Lab Results  Component Value Date   CHOL 133 01/13/2023   HDL 44.60 01/13/2023   LDLCALC 70 01/13/2023   LDLDIRECT 88.0 10/18/2021   TRIG 91.0 01/13/2023   CHOLHDL 3 01/13/2023   A/P: lipids at goal- wants to wait for next visit to recheck- continue current medications   #hypothyroidism S: compliant On thyroid medication- levothyroxine  Lab Results  Component Value Date   TSH 0.78 04/29/2023  A/P:hopefully stable- update TSH  today. Continue current meds for now    #hypertension S: medication: Ziac 2.5-6.25Mg  daily  BP Readings from Last 3 Encounters:  12/07/23 118/64  10/01/23 110/78  08/05/23 112/82  A/P: well controlled continue current medications   # B12 deficiency S: Current treatment/medication (oral vs. IM): Vitamin b12 Lab Results  Component Value Date   VITAMINB12 952 (H) 04/29/2023  A/P: controlled last check- continue to monitor at least yearly   # Hyperglycemia/insulin resistance/prediabetes-A1c of 5.8 peak S:  Medication: None Lab Results  Component Value Date   HGBA1C 5.8 01/13/2023   HGBA1C 5.7 02/21/2016  A/P: hopefully stable- update a1c today. Continue without meds for now   # Bursitis issues- has rehab exercises and needs to be consistent  Recommended follow up: Return in about 6 months (around 06/08/2024) for followup or sooner if needed.Schedule b4 you leave. Future Appointments  Date Time Provider Department Center  01/07/2024  2:00 PM Judi Saa, DO [facility] None  05/02/2024  3:00 PM [facility] ANNUAL WELLNESS VISIT 1 [facility]    Lab/Order associations:   ICD-10-CM   1. Essential hypertension  I10 Comprehensive metabolic panel    CBC with Differential/Platelet    2. Hyperlipidemia, unspecified hyperlipidemia type  E78.5 Comprehensive metabolic panel    CBC  with Differential/Platelet    3. Hypothyroidism, unspecified type  E03.9 TSH    4. Vitamin B 12 deficiency  E53.8     5. Screening for diabetes mellitus  Z13.1 HgB A1c    6. Hyperglycemia  R73.9 HgB A1c      No orders of the defined types were placed in this encounter.   Return precautions advised.  Tana Conch, MD

## 2023-12-07 NOTE — Patient Instructions (Addendum)
 Please stop by lab before you go If you have mychart- we will send your results within 3 business days of [place] receiving them.  If you do not have mychart- we will call you about results within 5 business days of [place] receiving them.  *please also note that you will see labs on mychart as soon as they post. I will later go in and write notes on them- will say "notes from Dr. Durene Cal"   Recommended follow up: Return in about 6 months (around 06/08/2024) for followup or sooner if needed.Schedule b4 you leave.

## 2023-12-08 ENCOUNTER — Encounter: Payer: Self-pay | Admitting: Family Medicine

## 2023-12-08 LAB — CBC WITH DIFFERENTIAL/PLATELET
Basophils Absolute: 0.1 10*3/uL (ref 0.0–0.1)
Basophils Relative: 1.3 % (ref 0.0–3.0)
Eosinophils Absolute: 0.1 10*3/uL (ref 0.0–0.7)
Eosinophils Relative: 1.1 % (ref 0.0–5.0)
HCT: 42.8 % (ref 36.0–46.0)
Hemoglobin: 14.3 g/dL (ref 12.0–15.0)
Lymphocytes Relative: 21.7 % (ref 12.0–46.0)
Lymphs Abs: 2.1 10*3/uL (ref 0.7–4.0)
MCHC: 33.5 g/dL (ref 30.0–36.0)
MCV: 92.7 fl (ref 78.0–100.0)
Monocytes Absolute: 0.7 10*3/uL (ref 0.1–1.0)
Monocytes Relative: 6.7 % (ref 3.0–12.0)
Neutro Abs: 6.8 10*3/uL (ref 1.4–7.7)
Neutrophils Relative %: 69.2 % (ref 43.0–77.0)
Platelets: 296 10*3/uL (ref 150.0–400.0)
RBC: 4.61 Mil/uL (ref 3.87–5.11)
RDW: 15.7 % — ABNORMAL HIGH (ref 11.5–15.5)
WBC: 9.9 10*3/uL (ref 4.0–10.5)

## 2023-12-08 LAB — COMPREHENSIVE METABOLIC PANEL
ALT: 16 U/L (ref 0–35)
AST: 18 U/L (ref 0–37)
Albumin: 4.2 g/dL (ref 3.5–5.2)
Alkaline Phosphatase: 60 U/L (ref 39–117)
BUN: 11 mg/dL (ref 6–23)
CO2: 27 meq/L (ref 19–32)
Calcium: 9.2 mg/dL (ref 8.4–10.5)
Chloride: 105 meq/L (ref 96–112)
Creatinine, Ser: 0.62 mg/dL (ref 0.40–1.20)
GFR: 86.69 mL/min (ref >60.00)
Glucose, Bld: 74 mg/dL (ref 70–99)
Potassium: 3.5 meq/L (ref 3.5–5.1)
Sodium: 141 meq/L (ref 135–145)
Total Bilirubin: 0.5 mg/dL (ref 0.2–1.2)
Total Protein: 6.8 g/dL (ref 6.0–8.3)

## 2023-12-08 LAB — HEMOGLOBIN A1C: Hgb A1c MFr Bld: 5.8 % (ref 4.6–6.5)

## 2023-12-08 LAB — TSH: TSH: 0.97 u[IU]/mL (ref 0.35–5.50)

## 2023-12-18 ENCOUNTER — Other Ambulatory Visit: Payer: Self-pay | Admitting: Family Medicine

## 2024-01-06 NOTE — Progress Notes (Unsigned)
 [facility] [address] [place] [address] Phone: [phone] Subjective:   Kelly Frazier am a scribe for Dr. Felipe Horton.  I'm seeing this patient by the request  of:  Almira Jaeger, MD  CC: Bilateral knee and right hip pain  BMW:[id number]  10/01/2023 Chronic problem with exacerbation.  Likely secondary to the compensation for the knee.  Hopefully patient responds.  Follow-up again in 6 to 8 weeks   Bilateral injection given today  Ice 20 minutes 2 times daily. Usually after activity and before bed. Discussed HEP  Increase activity as stated  Increase interval  Rtc in 8 week      Updated 01/07/2024 Kelly Frazier is a 78 y.o. female coming in with complaint of R hip and B knee pain. Patient states R hip still having pain in it. Last month there was radiating pain from right knee up to upper back to the bra line. About 3 or 4 weeks ago was digging up weeds, thought there was a baker's cyst in the left knee and could not put pressure on the leg. Both knees hurt and the gel usually wares out from 3 to 4 months. The knees some times keep her up at night.        Past Medical History:  Diagnosis Date   Allergy    Arthritis    OA   Cancer (HCC) 1980's   basal cell carcinoma   Cataract    bilateral 2 yrs ago    Diverticulosis    Excessive daytime sleepiness 06/05/2016   GERD (gastroesophageal reflux disease)    Head injury due to trauma    s/p attack - some memory loss    Headache(784.0)    Hx of colonic polyps    Hyperlipidemia    no meds    Hypertension    pt denies- on  meds    Thyroid  disease    Past Surgical History:  Procedure Laterality Date   AXILLARY ABCESS IRRIGATION AND DEBRIDEMENT     cyst   bcca from eyelid     basal cell 1980s.    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 1 son. 2 twin grandkids age 69 in 81- in [place].   Retired from [employer].   Works with Psychologist, sport and exercise for [employer].  Left handed Drinks caffeine one story   Social Drivers of Health   Financial Resource Strain: Medium Risk (12/06/2023)   Overall Financial Resource Strain (CARDIA)    Difficulty of Paying Living Expenses: Somewhat hard  Food Insecurity: No Food Insecurity (12/06/2023)   Hunger Vital Sign    Worried About Running Out of Food in the Last Year: Never true    Ran Out of Food in the Last Year: Never true  Transportation Needs: No Transportation Needs (12/06/2023)   PRAPARE - Administrator, Civil Service (Medical): No    Lack of Transportation (Non-Medical): No  Physical Activity: Inactive (12/06/2023)   Exercise Vital Sign    Days of Exercise per Week: 0 days    Minutes of Exercise per Session: 0 min  Stress: Stress Concern Present (12/06/2023)   [employer] of Occupational Health - Occupational Stress Questionnaire    Feeling of Stress : To some extent  Social Connections: Moderately Integrated (12/06/2023)   Social Connection and Isolation Panel [NHANES]    Frequency of Communication with Friends and Family: More than three  times a week    Frequency of Social Gatherings with Friends and Family: More than three times a week    Attends Religious Services: 1 to 4 times per year    Active Member of [employer] or Organizations: Yes    Attends Engineer, structural: More than 4 times per year    Marital Status: Divorced   Allergies  Allergen Reactions   Naprosyn [Naproxen] Swelling   Terbinafine Hcl Swelling    Lamisil    Family History  Problem Relation Age of Onset   Cancer - Lung Mother        lung- nonsmoker. in her 82s, perhaps related to job   Hypertension Mother    Lung cancer Mother    Liver disease Father    Alcohol abuse Father    Hypertension Father    Ovarian cancer Maternal Grandmother    Hodgkin's lymphoma Maternal Grandfather    Dementia Paternal Grandmother    Tuberculosis Paternal Grandfather    Colon cancer Maternal Aunt    Colon cancer Maternal Uncle    Colon polyps Neg Hx    Esophageal cancer Neg Hx    Rectal cancer Neg Hx    Stomach cancer Neg Hx    Pancreatic cancer Neg Hx     Current Outpatient Medications (Endocrine & Metabolic):    levothyroxine  (SYNTHROID ) 75 MCG tablet, TAKE 1 TABLET BY MOUTH EVERY DAY  Current Outpatient Medications (Cardiovascular):    atorvastatin  (LIPITOR) 10 MG tablet, TAKE 1 TABLET BY MOUTH EVERY DAY   bisoprolol -hydrochlorothiazide  (ZIAC ) 2.5-6.25 MG tablet, TAKE 1 TABLET BY MOUTH EVERY DAY    Current Outpatient Medications (Hematological):    vitamin B-12 (CYANOCOBALAMIN ) 1000 MCG tablet, Take 1,000 mcg by mouth daily.  Current Outpatient Medications (Other):    Ascorbic Acid (VITAMIN C WITH ROSE HIPS) 500 MG tablet, Take 500 mg by mouth daily.   Biotin 10 MG CHEW, Chew by mouth.   CALCIUM -MAGNESIUM-ZINC PO, Take by mouth.   Cholecalciferol (VITAMIN D3) 10 MCG (400 UNIT) CAPS, Take 10 mcg by mouth.   Clobetasol  Prop Emollient Base (CLOBETASOL  PROPIONATE E) 0.05 % emollient cream, APPLY 1 APPLICATION TOPICALLY EVERY DAY AT BEDTIME    clotrimazole (LOTRIMIN) 1 % cream, Apply 1 Application topically 2 (two) times daily.   co-enzyme Q-10 30 MG capsule, Take 200 mg by mouth daily.   diclofenac  sodium (VOLTAREN ) 1 %  GEL, Apply topically to affected area qid   hydrocortisone 2.5 % cream, Apply topically in the morning and at bedtime.   Multiple Vitamins-Minerals (WOMENS 50+ ADVANCED PO), Take 2 tablets by mouth daily.   omeprazole  (PRILOSEC) 20 MG capsule, TAKE 1-2 CAPSULES BY MOUTH EVERY DAY   Probiotic Product (PROBIOTIC DAILY PO), Take by mouth. Garden of life women over 50 raw probiotics   Reviewed prior external information including notes and imaging from  primary care provider As well as notes that were available from care everywhere and other healthcare systems.  Past medical history, social, surgical and family history all reviewed in electronic medical record.  No pertanent information unless stated regarding to the chief complaint.   Review of Systems:  No headache, visual changes, nausea, vomiting, diarrhea, constipation, dizziness, abdominal pain, skin rash, fevers, chills, night sweats, weight loss, swollen lymph nodes, body aches, joint swelling, chest pain, shortness of breath, mood changes. POSITIVE muscle aches  Objective  Blood pressure (!) 142/70, pulse 64, height 5\' 2"  (1.575 m), weight 175 lb (79.4 kg), SpO2 96%.   General: No apparent distress alert and oriented x3 mood and affect normal, dressed appropriately.  HEENT: Pupils equal, extraocular movements intact  Respiratory: Patient's speak in full sentences and does not appear short of breath  Cardiovascular: No lower extremity edema, non tender, no erythema  Knee exam shows patient does have some arthritic changes noted as well.  Does have some crepitus noted.  Instability with valgus and varus force Tenderness over the right greater trochanteric area.   After informed written and verbal consent, patient was seated on exam table. Right knee was  prepped with alcohol swab and utilizing anterolateral approach, patient's right knee space was injected with 4:1  marcaine 0.5%: Kenalog  40mg /dL. Patient tolerated the procedure well without immediate complications.  After informed written and verbal consent, patient was seated on exam table. Left knee was prepped with alcohol swab and utilizing anterolateral approach, patient's left knee space was injected with 4:1  marcaine 0.5%: Kenalog  40mg /dL. Patient tolerated the procedure well without immediate complications.   After verbal consent patient was prepped with alcohol swab and with a 21-gauge 2 inch needle injected into the right greater trochanteric area with 2 cc of 0.5% Marcaine and 1 cc of Kenalog  40 mg/mL.  No blood loss.  Band-Aid placed.  Postinjection instructions given Impression and Recommendations:     The above documentation has been reviewed and is accurate and complete Kelly Branden M Dolphus Linch, DO

## 2024-01-07 ENCOUNTER — Ambulatory Visit: Payer: Medicare Other | Admitting: Family Medicine

## 2024-01-07 ENCOUNTER — Encounter: Payer: Self-pay | Admitting: Family Medicine

## 2024-01-07 VITALS — BP 142/70 | HR 64 | Ht 62.0 in | Wt 175.0 lb

## 2024-01-07 DIAGNOSIS — M7061 Trochanteric bursitis, right hip: Secondary | ICD-10-CM | POA: Diagnosis not present

## 2024-01-07 DIAGNOSIS — M170 Bilateral primary osteoarthritis of knee: Secondary | ICD-10-CM

## 2024-01-07 DIAGNOSIS — M1712 Unilateral primary osteoarthritis, left knee: Secondary | ICD-10-CM

## 2024-01-07 NOTE — Assessment & Plan Note (Signed)
 Bilateral knee exam does show arthritic changes noted.  Fairly severe overall.  Has responded decently to the steroid injections.  Has been sometime since we have seen her.  Hopeful that this will make improvement.  Will follow-up again in 6 to 8 weeks

## 2024-01-07 NOTE — Assessment & Plan Note (Signed)
 Repeat injection given again today.  Has been greater than 6 months.  Hopefully patient will make significant strides again.  Continue to work on core strengthening and hip abductor strengthening.  Follow-up again in 6 to 8 weeks worsening pain would be to work up lumbar spine more.

## 2024-01-07 NOTE — Patient Instructions (Addendum)
 Injected both knees  Injected hip today See me again in 3-4 months

## 2024-01-11 ENCOUNTER — Ambulatory Visit (INDEPENDENT_AMBULATORY_CARE_PROVIDER_SITE_OTHER): Admitting: Obstetrics and Gynecology

## 2024-01-11 ENCOUNTER — Encounter: Payer: Self-pay | Admitting: Obstetrics and Gynecology

## 2024-01-11 VITALS — BP 166/89 | HR 59

## 2024-01-11 DIAGNOSIS — N3281 Overactive bladder: Secondary | ICD-10-CM | POA: Diagnosis not present

## 2024-01-11 DIAGNOSIS — L280 Lichen simplex chronicus: Secondary | ICD-10-CM | POA: Diagnosis not present

## 2024-01-11 DIAGNOSIS — R350 Frequency of micturition: Secondary | ICD-10-CM

## 2024-01-11 MED ORDER — CLOBETASOL PROPIONATE E 0.05 % EX CREA: 1.0000 | TOPICAL_CREAM | Freq: Every day | CUTANEOUS | 0 refills | Status: DC

## 2024-01-11 NOTE — Progress Notes (Signed)
 [facility] Return Visit  SUBJECTIVE  History of Present Illness: Kelly Frazier is a 78 y.o. female seen in follow-up for OAB and Lichen's sclerosus. Plan at last visit was consider SNM and continue to do Clobetasol  cream for the Lichen's..     Past Medical History: Patient  has a past medical history of Allergy, Arthritis, Cancer (HCC) (1980's), Cataract, Diverticulosis, Excessive daytime sleepiness (06/05/2016), GERD (gastroesophageal reflux disease), Head injury due to trauma, Headache(784.0), colonic polyps, Hyperlipidemia, Hypertension, and Thyroid  disease.   Past Surgical History: She  has a past surgical history that includes bcca from eyelid; Colonoscopy (08/17/06); Axillary abcess irrigation and debridement; NSVD (1971); Cataract extraction, bilateral; Polypectomy; removal of moles and denmatoid; osseous implant; and Breast biopsy (2006).   Medications: She has a current medication list which includes the following prescription(s): vitamin c with rose hips, atorvastatin , biotin, bisoprolol -hydrochlorothiazide , calcium -magnesium-zinc, vitamin d3, clotrimazole, co-enzyme q-10, diclofenac  sodium, hydrocortisone, levothyroxine , multiple vitamins-minerals, omeprazole , probiotic product, cyanocobalamin , and clobetasol  propionate e.   Allergies: Patient is allergic to naprosyn [naproxen] and terbinafine hcl.   Social History: Patient  reports that she has never smoked. She has never used smokeless tobacco. She reports current alcohol use of about 2.0 standard drinks of alcohol per week. She reports that she does not use drugs.     OBJECTIVE     Physical Exam: Vitals:   01/11/24 1259  BP: (!) 166/89  Pulse: (!) 59   Gen: No apparent distress, A&O x 3.  Detailed Urogynecologic Evaluation:  Deferred.    ASSESSMENT AND PLAN    Kelly Frazier is a 78 y.o. with:  1. Overactive bladder   2. Urinary frequency   3. Lichen simplex chronicus     Overactive  bladder  -Patient is considering doing SNM for both OAB and Fecal urgency. She reports she will consider and let us  know.   Lichen simplex chronicus -     Clobetasol  Propionate E; Apply 1 Application topically daily.  Dispense: 30 g; Refill: 0  Patient to call if she would like to see Dr. Frutoso Jing for SNM discussion. Otherwise will plan for 1 year follow up.     Eria Lozoya G Jireh Elmore, NP

## 2024-01-12 ENCOUNTER — Encounter: Payer: Self-pay | Admitting: Obstetrics and Gynecology

## 2024-01-22 DIAGNOSIS — D329 Benign neoplasm of meninges, unspecified: Secondary | ICD-10-CM | POA: Diagnosis not present

## 2024-02-01 ENCOUNTER — Telehealth: Payer: Self-pay | Admitting: Family Medicine

## 2024-02-01 ENCOUNTER — Other Ambulatory Visit: Payer: Self-pay

## 2024-02-01 DIAGNOSIS — L280 Lichen simplex chronicus: Secondary | ICD-10-CM

## 2024-02-01 MED ORDER — CLOBETASOL PROPIONATE E 0.05 % EX CREA: 1.0000 | TOPICAL_CREAM | Freq: Every day | CUTANEOUS | 0 refills | Status: DC

## 2024-02-01 NOTE — Telephone Encounter (Signed)
 Patient called stating that when she was here for her last visit on 4/24, Dr Felipe Horton mentioned some things that the bakers cyst in her knee would do, but so far, none of those things have happened.  She asked if this was normal or if should be seen?  Please advise.

## 2024-02-17 ENCOUNTER — Telehealth: Payer: Self-pay

## 2024-02-17 NOTE — Telephone Encounter (Signed)
 Patient called and advised of Dr. Lolita Rise suggestion. Patient let me know she will talk to [facility] and see what they say.

## 2024-02-17 NOTE — Telephone Encounter (Signed)
 Pt had Prevnar-13 in 2017.  Copied from CRM [id number]. Topic: Clinical - Medical Advice >> Feb 17, 2024 12:22 PM Abigail D wrote: Reason for CRM: Patient was wondering if she needs another pneumonia vaccine. Is she due for another one?

## 2024-02-17 NOTE — Telephone Encounter (Signed)
 She has had both Prevnar 13 and Pneumovax 23 and technically does not have to have another pneumonia shot.  Prevnar 20 is a new vaccination which provides some additional strains on top of the more protective Prevnar 13-she can get this if she would like to but is not required-it is a personal decision

## 2024-02-21 DIAGNOSIS — Z23 Encounter for immunization: Secondary | ICD-10-CM | POA: Diagnosis not present

## 2024-03-11 ENCOUNTER — Other Ambulatory Visit (HOSPITAL_BASED_OUTPATIENT_CLINIC_OR_DEPARTMENT_OTHER): Payer: Self-pay | Admitting: Family Medicine

## 2024-03-11 DIAGNOSIS — Z1231 Encounter for screening mammogram for malignant neoplasm of breast: Secondary | ICD-10-CM

## 2024-04-06 NOTE — Progress Notes (Unsigned)
 [facility] [address] [place] [address] Phone: [phone] Subjective:   [name], am serving as a scribe for Dr. Arthea Claudene.  I'm seeing this patient by the request  of:  Kelly Garnette KIDD, MD  CC: Bilateral knee and right hip pain  YEP:Kelly Frazier  01/07/2024 Repeat injection given again today.  Has been greater than 6 months.  Hopefully patient will make significant strides again.  Continue to work on core strengthening and hip abductor strengthening.  Follow-up again in 6 to 8 weeks worsening pain would be to work up lumbar spine more.     Bilateral knee exam does show arthritic changes noted.  Fairly severe overall.  Has responded decently to the steroid injections.  Has been sometime since we have seen her.  Hopeful that this will make improvement.  Will follow-up again in 6 to 8 weeks     Updated 04/07/2024 Kelly Frazier is a 78 y.o. female coming in with complaint of B knee and R hip pain. Knees are ready for injection. Hip is doing okay today. No new symptoms.   Reviewing patient's chart in May was recently seen for meningioma of the head.  Shows with the MRI is completely stable at the moment.  Due for another MRI in May of next year.    Past Medical History:  Diagnosis Date   Allergy    Arthritis    OA   Cancer (HCC) 1980's   basal cell carcinoma   Cataract    bilateral 2 yrs ago    Diverticulosis    Excessive daytime sleepiness 06/05/2016   GERD (gastroesophageal reflux disease)    Head injury due to trauma    s/p attack - some memory loss    Headache(784.0)    Hx of colonic polyps    Hyperlipidemia    no meds    Hypertension    pt denies- on  meds    Thyroid  disease    Past Surgical History:  Procedure Laterality Date   AXILLARY ABCESS IRRIGATION AND DEBRIDEMENT     cyst   bcca from eyelid     basal cell 1980s.    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 1 son. 2 twin grandkids age 45 in 71- in [place].   Retired from [place].  Works with Psychologist, sport and exercise for [employer].  Left handed Drinks caffeine one story   Social Drivers of Frazier   Financial Resource Strain: Medium Risk (12/06/2023)   Overall Financial Resource Strain (CARDIA)    Difficulty of Paying Living Expenses: Somewhat hard  Food Insecurity: No Food Insecurity (12/06/2023)   Hunger Vital Sign    Worried About Running Out of Food in the Last Year: Never true    Ran Out of Food in the Last Year: Never true  Transportation Needs: No Transportation Needs (12/06/2023)   PRAPARE - Administrator, Civil Service (Medical): No    Lack of Transportation (Non-Medical): No  Physical Activity: Inactive (12/06/2023)   Exercise Vital Sign    Days of Exercise per Week: 0 days    Minutes of Exercise per Session: 0 min  Stress: Stress Concern Present (12/06/2023)   Kelly Frazier - Occupational Stress Questionnaire    Feeling of Stress : To some extent  Social Connections: Moderately Integrated (12/06/2023)   Social Connection and  Isolation Panel    Frequency of Communication with Friends and Family: More than three times a week    Frequency of Social Gatherings with Friends and Family: More than three times a week    Attends Religious Services: 1 to 4 times per year    Active Member of [employer] or Organizations: Yes    Attends Engineer, structural: More than 4 times per year    Marital Status: Divorced   Allergies  Allergen Reactions   Naprosyn [Naproxen] Swelling   Terbinafine Hcl Swelling    Lamisil    Family History  Problem Relation Age of Onset   Cancer - Lung Mother        lung- nonsmoker. in her 87s, perhaps related to job   Hypertension Mother    Lung cancer Mother    Liver disease Father    Alcohol abuse Father    Hypertension Father    Ovarian cancer Maternal Grandmother    Hodgkin's lymphoma Maternal Grandfather    Dementia Paternal Grandmother    Tuberculosis Paternal Grandfather    Colon cancer Maternal Aunt    Colon cancer Maternal Uncle    Colon polyps Neg Hx    Esophageal cancer Neg Hx    Rectal cancer Neg Hx    Stomach cancer Neg Hx    Pancreatic cancer Neg Hx     Current Outpatient Medications (Endocrine & Metabolic):    levothyroxine  (SYNTHROID ) 75 MCG tablet, TAKE 1 TABLET BY MOUTH EVERY DAY  Current Outpatient Medications (Cardiovascular):    atorvastatin  (LIPITOR) 10 MG tablet, TAKE 1 TABLET BY MOUTH EVERY DAY   bisoprolol -hydrochlorothiazide  (ZIAC ) 2.5-6.25 MG tablet, TAKE 1 TABLET BY MOUTH EVERY DAY    Current Outpatient Medications (Hematological):    vitamin B-12 (CYANOCOBALAMIN ) 1000 MCG tablet, Take 1,000 mcg by mouth daily.  Current Outpatient Medications (Other):    Ascorbic Acid (VITAMIN C WITH ROSE HIPS) 500 MG tablet, Take 500 mg by mouth daily.   Biotin 10 MG CHEW, Chew by mouth.   CALCIUM -MAGNESIUM-ZINC PO, Take by mouth.   Cholecalciferol (VITAMIN D3) 10 MCG (400 UNIT) CAPS, Take 10 mcg by mouth.   Clobetasol  Prop Emollient Base (CLOBETASOL   PROPIONATE E) 0.05 % emollient cream, Apply 1 Application topically daily.   clotrimazole (LOTRIMIN) 1 % cream, Apply 1 Application topically 2 (two) times daily.   co-enzyme Q-10 30 MG capsule, Take 200 mg by mouth daily.  diclofenac  sodium (VOLTAREN ) 1 % GEL, Apply topically to affected area qid   hydrocortisone 2.5 % cream, Apply topically in the morning and at bedtime.   Multiple Vitamins-Minerals (WOMENS 50+ ADVANCED PO), Take 2 tablets by mouth daily.   omeprazole  (PRILOSEC) 20 MG capsule, TAKE 1-2 CAPSULES BY MOUTH EVERY DAY   Probiotic Product (PROBIOTIC DAILY PO), Take by mouth. Garden of life women over 50 raw probiotics   Reviewed prior external information including notes and imaging from  primary care provider As well as notes that were available from care everywhere and other healthcare systems.  Past medical history, social, surgical and family history all reviewed in electronic medical record.  No pertanent information unless stated regarding to the chief complaint.   Review of Systems:  No headache, visual changes, nausea, vomiting, diarrhea, constipation, dizziness, abdominal pain, skin rash, fevers, chills, night sweats, weight loss, swollen lymph nodes, body aches, joint swelling, chest pain, shortness of breath, mood changes. POSITIVE muscle aches  Objective  Blood pressure 124/82, pulse 67, height 5' 2 (1.575 m), weight 176 lb (79.8 kg), SpO2 97%.   General: No apparent distress alert and oriented x3 mood and affect normal, dressed appropriately.  HEENT: Pupils equal, extraocular movements intact  Respiratory: Patient's speak in full sentences and does not appear short of breath  Cardiovascular: No lower extremity edema, non tender, no erythema  Knee exam shows arthritic changes noted bilaterally.  Seems to be relatively severe with some trace effusion noted on the right side.  After informed written and verbal consent, patient was seated on exam table. Right knee  was prepped with alcohol swab and utilizing anterolateral approach, patient's right knee space was injected with 4:1  marcaine 0.5%: Kenalog  40mg /dL. Patient tolerated the procedure well without immediate complications.  After informed written and verbal consent, patient was seated on exam table. Left knee was prepped with alcohol swab and utilizing anterolateral approach, patient's left knee space was injected with 4:1  marcaine 0.5%: Kenalog  40mg /dL. Patient tolerated the procedure well without immediate complications.    Impression and Recommendations:    The above documentation has been reviewed and is accurate and complete Kelly Herbison M Taiki Buckwalter, DO

## 2024-04-07 ENCOUNTER — Ambulatory Visit: Admitting: Family Medicine

## 2024-04-07 VITALS — BP 124/82 | HR 67 | Ht 62.0 in | Wt 176.0 lb

## 2024-04-07 DIAGNOSIS — M170 Bilateral primary osteoarthritis of knee: Secondary | ICD-10-CM

## 2024-04-07 NOTE — Assessment & Plan Note (Signed)
 Bilateral injections given today, tolerated the procedure well, discussed icing regimen of home exercises, increase activity slowly.  Follow-up again in 6 to 8 weeks can repeat injections every 12 weeks if needed.  Continue to be active.

## 2024-04-07 NOTE — Patient Instructions (Signed)
 Kelly Frazier the good fight See you again in 3 months

## 2024-04-18 ENCOUNTER — Encounter (HOSPITAL_BASED_OUTPATIENT_CLINIC_OR_DEPARTMENT_OTHER): Payer: Self-pay

## 2024-04-18 ENCOUNTER — Inpatient Hospital Stay (HOSPITAL_BASED_OUTPATIENT_CLINIC_OR_DEPARTMENT_OTHER): Admission: RE | Admit: 2024-04-18 | Source: Ambulatory Visit

## 2024-04-25 ENCOUNTER — Other Ambulatory Visit: Payer: Self-pay | Admitting: Obstetrics and Gynecology

## 2024-04-25 DIAGNOSIS — L280 Lichen simplex chronicus: Secondary | ICD-10-CM

## 2024-04-28 NOTE — Telephone Encounter (Signed)
 DX: L28.0

## 2024-05-02 ENCOUNTER — Ambulatory Visit (INDEPENDENT_AMBULATORY_CARE_PROVIDER_SITE_OTHER): Payer: Medicare Other

## 2024-05-02 VITALS — BP 126/78 | HR 72 | Temp 98.6°F | Ht 62.0 in | Wt 176.0 lb

## 2024-05-02 DIAGNOSIS — Z0000 Encounter for general adult medical examination without abnormal findings: Secondary | ICD-10-CM | POA: Diagnosis not present

## 2024-05-02 NOTE — Patient Instructions (Signed)
 Kelly Frazier , Thank you for taking time out of your busy schedule to complete your Annual Wellness Visit with me. I enjoyed our conversation and look forward to speaking with you again next year. I, as well as your care team,  appreciate your ongoing commitment to your health goals. Please review the following plan we discussed and let me know if I can assist you in the future. Your Game plan/ To Do List    Referrals: If you haven't heard from the office you've been referred to, please reach out to them at the phone provided.   Follow up Visits: We will see or speak with you next year for your Next Medicare AWV with our clinical staff Have you seen your provider in the last 6 months (3 months if uncontrolled diabetes)? Yes  Clinician Recommendations:  Aim for 30 minutes of exercise or brisk walking, 6-8 glasses of water, and 5 servings of fruits and vegetables each day.       This is a list of the screenings recommended for you:  Health Maintenance  Topic Date Due   Medicare Annual Wellness Visit  04/26/2024   Flu Shot  04/15/2024   COVID-19 Vaccine (8 - 2024-25 season) 08/22/2024   DTaP/Tdap/Td vaccine (3 - Tdap) 11/23/2024   Pneumococcal Vaccine for age over 69  Completed   DEXA scan (bone density measurement)  Completed   Hepatitis C Screening  Completed   Zoster (Shingles) Vaccine  Completed   HPV Vaccine  Aged Out   Meningitis B Vaccine  Aged Out   Pneumococcal Vaccine  Discontinued   Colon Cancer Screening  Discontinued    Advanced directives: (Provided) Advance directive discussed with you today. I have provided a copy for you to complete at home and have notarized. Once this is complete, please bring a copy in to our office so we can scan it into your chart.  Advance Care Planning is important because it:  [x]  Makes sure you receive the medical care that is consistent with your values, goals, and preferences  [x]  It provides guidance to your family and loved ones and reduces  their decisional burden about whether or not they are making the right decisions based on your wishes.  Follow the link provided in your after visit summary or read over the paperwork we have mailed to you to help you started getting your Advance Directives in place. If you need assistance in completing these, please reach out to us  so that we can help you!  See attachments for Preventive Care and Fall Prevention Tips.

## 2024-05-02 NOTE — Progress Notes (Signed)
 Subjective:   Kelly Frazier is a 78 y.o. who presents for a Medicare Wellness preventive visit.  As a reminder, Annual Wellness Visits don't include a physical exam, and some assessments may be limited, especially if this visit is performed virtually. We may recommend an in-person follow-up visit with your provider if needed.  Visit Complete: In person    Persons Participating in Visit: Patient.  AWV Questionnaire: Yes: Patient Medicare AWV questionnaire was completed by the patient on 04/30/24; I have confirmed that all information answered by patient is correct and no changes since this date.  Cardiac Risk Factors include: advanced age (>67men, >22 women);dyslipidemia;hypertension;obesity (BMI >30kg/m2)     Objective:    Today's Vitals   05/02/24 1505  BP: 126/78  Pulse: 72  Temp: 98.6 F (37 C)  SpO2: 94%  Weight: 176 lb (79.8 kg)  Height: 5' 2 (1.575 m)   Body mass index is 32.19 kg/m.     05/02/2024    3:14 PM 08/28/2021    2:15 PM 06/25/2021    9:52 AM 04/15/2021    3:34 PM 12/19/2020    5:10 PM 09/02/2019   11:47 AM 08/25/2018    2:15 PM  Advanced Directives  Does Patient Have a Medical Advance Directive? No No No Yes No Yes No   Type of Advance Directive      Living will;Healthcare Power of Attorney   Does patient want to make changes to medical advance directive?    Yes (MAU/Ambulatory/Procedural Areas - Information given)  No - Patient declined   Copy of Healthcare Power of Attorney in Chart?      No - copy requested   Would patient like information on creating a medical advance directive? No - Patient declined No - Patient declined   No - Patient declined  No - Patient declined      Data saved with a previous flowsheet row definition    Current Medications (verified) Outpatient Encounter Medications as of 05/02/2024  Medication Sig   Ascorbic Acid (VITAMIN C WITH ROSE HIPS) 500 MG tablet Take 500 mg by mouth daily.   atorvastatin  (LIPITOR) 10 MG  tablet TAKE 1 TABLET BY MOUTH EVERY DAY   Biotin 10 MG CHEW Chew by mouth.   bisoprolol -hydrochlorothiazide  (ZIAC ) 2.5-6.25 MG tablet TAKE 1 TABLET BY MOUTH EVERY DAY   CALCIUM -MAGNESIUM-ZINC PO Take by mouth.   Cholecalciferol (VITAMIN D3) 10 MCG (400 UNIT) CAPS Take 10 mcg by mouth.   CLOBETASOL  PROPIONATE E 0.05 % emollient cream APPLY 1 APPLICATION TOPICALLY DAILY   clotrimazole (LOTRIMIN) 1 % cream Apply 1 Application topically 2 (two) times daily.   co-enzyme Q-10 30 MG capsule Take 200 mg by mouth daily.   diclofenac  sodium (VOLTAREN ) 1 % GEL Apply topically to affected area qid   hydrocortisone 2.5 % cream Apply topically in the morning and at bedtime.   levothyroxine  (SYNTHROID ) 75 MCG tablet TAKE 1 TABLET BY MOUTH EVERY DAY   Multiple Vitamins-Minerals (WOMENS 50+ ADVANCED PO) Take 2 tablets by mouth daily.   omeprazole  (PRILOSEC) 20 MG capsule TAKE 1-2 CAPSULES BY MOUTH EVERY DAY   Probiotic Product (PROBIOTIC DAILY PO) Take by mouth. Garden of life women over 50 raw probiotics   vitamin B-12 (CYANOCOBALAMIN ) 1000 MCG tablet Take 1,000 mcg by mouth daily.   No facility-administered encounter medications on file as of 05/02/2024.    Allergies (verified) Naprosyn [naproxen] and Terbinafine hcl   History: Past Medical History:  Diagnosis Date   Allergy  Arthritis    OA   Cancer (HCC) 1980's   basal cell carcinoma   Cataract    bilateral 2 yrs ago    Diverticulosis    Excessive daytime sleepiness 06/05/2016   GERD (gastroesophageal reflux disease)    Head injury due to trauma    s/p attack - some memory loss    Headache(784.0)    Hx of colonic polyps    Hyperlipidemia    no meds    Hypertension    pt denies- on  meds    Thyroid  disease    Past Surgical History:  Procedure Laterality Date   AXILLARY ABCESS IRRIGATION AND DEBRIDEMENT     cyst   bcca from eyelid     basal cell 1980s.    BREAST BIOPSY  2006   and 2017   CATARACT EXTRACTION, BILATERAL      COLONOSCOPY  08/17/06   NSVD  1971   x1   osseous implant     POLYPECTOMY     removal of moles and denmatoid     Family History  Problem Relation Age of Onset   Cancer - Lung Mother        lung- nonsmoker. in her 44s, perhaps related to job   Hypertension Mother    Lung cancer Mother    Liver disease Father    Alcohol abuse Father    Hypertension Father    Ovarian cancer Maternal Grandmother    Hodgkin's lymphoma Maternal Grandfather    Dementia Paternal Grandmother    Tuberculosis Paternal Grandfather    Colon cancer Maternal Aunt    Colon cancer Maternal Uncle    Colon polyps Neg Hx    Esophageal cancer Neg Hx    Rectal cancer Neg Hx    Stomach cancer Neg Hx    Pancreatic cancer Neg Hx    Social History   Socioeconomic History   Marital status: Single    Spouse name: Not on file   Number of children: Not on file   Years of education: Not on file   Highest education level: Bachelor's degree (e.g., BA, AB, BS)  Occupational History    Comment: [employer] for 29 years and 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 1 son. 2 twin grandkids age 56 in 34- in [place].   Retired from [employer].   Works with Psychologist, sport and exercise for [employer].  Left handed Drinks caffeine one story   Social Drivers of Health   Financial Resource Strain: Medium Risk (04/30/2024)   Overall Financial Resource Strain (CARDIA)    Difficulty of Paying Living Expenses: Somewhat hard  Food Insecurity: No Food Insecurity (04/30/2024)   Hunger Vital Sign    Worried About Running Out of Food in the Last Year:  Never true    Ran Out of Food in the Last Year: Never true  Transportation Needs: No Transportation Needs (04/30/2024)   PRAPARE - Administrator, Civil Service (Medical): No  Lack of Transportation (Non-Medical): No  Physical Activity: Inactive (04/30/2024)   Exercise Vital Sign    Days of Exercise per Week: 0 days    Minutes of Exercise per Session: 0 min  Stress: Stress Concern Present (04/30/2024)   [employer] of Occupational Health - Occupational Stress Questionnaire    Feeling of Stress: To some extent  Social Connections: Moderately Isolated (04/30/2024)   Social Connection and Isolation Panel    Frequency of Communication with Friends and Family: More than three times a week    Frequency of Social Gatherings with Friends and Family: More than three times a week    Attends Religious Services: Never    Database administrator or Organizations: Yes    Attends Engineer, structural: More than 4 times per year    Marital Status: Divorced    Tobacco Counseling Counseling given: Not Answered    Clinical Intake:  Pre-visit preparation completed: Yes  Pain : No/denies pain     BMI - recorded: 32.19 Nutritional Status: BMI > 30  Obese Nutritional Risks: None  Lab Results  Component Value Date   HGBA1C 5.8 12/07/2023   HGBA1C 5.8 01/13/2023   HGBA1C 5.7 02/21/2016     How often do you need to have someone help you when you read instructions, pamphlets, or other written materials from your doctor or pharmacy?: 1 - Never  Interpreter Needed?: No  Information entered by :: Ellouise Haws, LPN   Activities of Daily Living     04/30/2024    3:41 PM  In your present state of health, do you have any difficulty performing the following activities:  Hearing? 1  Comment hearing aids  Vision? 0  Difficulty concentrating or making decisions? 0  Walking or climbing stairs? 1  Comment take time  Dressing or bathing? 0  Doing errands, shopping? 0   Preparing Food and eating ? N  Using the Toilet? N  In the past six months, have you accidently leaked urine? Y  Comment wears a pad  Do you have problems with loss of bowel control? Y  Comment wears a pad when needed  Managing your Medications? N  Managing your Finances? N  Housekeeping or managing your Housekeeping? N    Patient Care Team: Katrinka Garnette KIDD, MD as PCP - General (Family Medicine) Camillo Golas, MD as Consulting Physician (Ophthalmology) Burgin, Wells Garre, MD as Consulting Physician (Obstetrics and Gynecology) Pandora Cadet, [facility] as Pharmacist (Pharmacist)  I have updated your Care Teams any recent Medical Services you may have received from other providers in the past year.     Assessment:   This is a routine wellness examination for Kelly Frazier.  Hearing/Vision screen Hearing Screening - Comments:: Pt has hearing aids  Vision Screening - Comments:: Wears rx glasses - up to date with routine eye exams [facility]    Goals Addressed             This Visit's Progress    Patient Stated       Lose weight        Depression Screen     05/02/2024    3:18 PM 06/09/2023    2:28 PM 04/27/2023    3:11 PM 04/21/2022    3:33 PM 04/15/2021    3:28 PM 12/13/2020    2:32 PM 04/25/2020    4:17 PM  PHQ 2/9 Scores  PHQ - 2 Score 1 1 0 0 0 0 0  PHQ- 9  Score  2         Fall Risk     04/30/2024    3:41 PM 04/23/2023    9:18 AM 04/21/2022    3:37 PM 06/25/2021    9:51 AM 04/15/2021    4:29 PM  Fall Risk   Falls in the past year? 0 0 0 1 1  Number falls in past yr: 0 0 0 0 0  Injury with Fall? 0 0 0 1 1  Risk for fall due to : Impaired balance/gait Impaired vision Impaired vision    Risk for fall due to: Comment at times on uneven ground      Follow up Falls prevention discussed Falls prevention discussed Falls prevention discussed        Data saved with a previous flowsheet row definition    MEDICARE RISK AT HOME:  Medicare Risk at Home Any  stairs in or around the home?: (Patient-Rptd) Yes If so, are there any without handrails?: (Patient-Rptd) Yes Home free of loose throw rugs in walkways, pet beds, electrical cords, etc?: (Patient-Rptd) Yes Adequate lighting in your home to reduce risk of falls?: (Patient-Rptd) Yes Life alert?: (Patient-Rptd) No Use of a cane, walker or w/c?: (Patient-Rptd) No Grab bars in the bathroom?: (Patient-Rptd) Yes Shower chair or bench in shower?: (Patient-Rptd) Yes Elevated toilet seat or a handicapped toilet?: (Patient-Rptd) Yes  TIMED UP AND GO:  Was the test performed?  Yes  Length of time to ambulate 10 feet: 15 sec Gait steady and fast without use of assistive device  Cognitive Function: 6CIT completed      06/25/2021   10:00 AM  Montreal Cognitive Assessment   Visuospatial/ Executive (0/5) 4  Naming (0/3) 3  Attention: Read list of digits (0/2) 2  Attention: Read list of letters (0/1) 1  Attention: Serial 7 subtraction starting at 100 (0/3) 3  Language: Repeat phrase (0/2) 2  Language : Fluency (0/1) 1  Abstraction (0/2) 2  Delayed Recall (0/5) 3  Orientation (0/6) 6  Total 27  Adjusted Score (based on education) 27      05/02/2024    3:15 PM 04/27/2023    3:14 PM 04/21/2022    3:38 PM 04/15/2021    3:40 PM  6CIT Screen  What Year? 0 points 0 points 0 points 0 points  What month? 0 points 0 points 0 points 0 points  What time? 0 points 0 points 0 points 0 points  Count back from 20 0 points 0 points 0 points 0 points  Months in reverse 0 points 0 points 0 points 0 points  Repeat phrase 2 points 0 points 0 points 0 points  Total Score 2 points 0 points 0 points 0 points    Immunizations Immunization History  Administered Date(s) Administered   Fluad Quad(high Dose 65+) 05/16/2019, 05/19/2020, 05/22/2022   Influenza Split 06/23/2011   Influenza Whole 08/31/2007, 06/27/2008, 06/21/2009, 06/21/2010, 07/31/2010   Influenza, High Dose Seasonal PF 06/16/2016, 06/11/2017,  07/13/2018, 05/16/2020, 05/30/2021, 06/04/2023   Influenza,inj,Quad PF,6+ Mos 06/14/2013, 06/28/2014, 06/28/2015   Moderna Covid-19 Vaccine  Bivalent Booster 44yrs & up 06/29/2021   Moderna Sars-Covid-2 Vaccination 10/18/2019, 11/17/2019, 07/20/2020, 01/09/2021   PFIZER Comirnaty(Gray Top)Covid-19 Tri-Sucrose Vaccine 06/21/2022   PNEUMOCOCCAL CONJUGATE-20 02/21/2024   Pfizer(Comirnaty)Fall Seasonal Vaccine 12 years and older 02/21/2024   Pneumococcal Conjugate-13 02/21/2016   Pneumococcal Polysaccharide-23 09/14/2013   Respiratory Syncytial Virus Vaccine,Recomb Aduvanted(Arexvy) 07/03/2022   Td 09/15/2005, 11/24/2014   Zoster Recombinant(Shingrix) 06/14/2018, 10/11/2018   Zoster, Live 12/31/2007  Screening Tests Health Maintenance  Topic Date Due   INFLUENZA VACCINE  04/15/2024   COVID-19 Vaccine (8 - 2024-25 season) 08/22/2024   DTaP/Tdap/Td (3 - Tdap) 11/23/2024   Medicare Annual Wellness (AWV)  05/02/2025   Pneumococcal Vaccine: 50+ Years  Completed   DEXA SCAN  Completed   Hepatitis C Screening  Completed   Zoster Vaccines- Shingrix  Completed   HPV VACCINES  Aged Out   Meningococcal B Vaccine  Aged Out   Pneumococcal Vaccine  Discontinued   Colonoscopy  Discontinued    Health Maintenance  Health Maintenance Due  Topic Date Due   INFLUENZA VACCINE  04/15/2024   Health Maintenance Items Addressed: See Nurse Notes at the end of this note  Additional Screening:  Vision Screening: Recommended annual ophthalmology exams for early detection of glaucoma and other disorders of the eye. Would you like a referral to an eye doctor? No    Dental Screening: Recommended annual dental exams for proper oral hygiene  Community Resource Referral / Chronic Care Management: CRR required this visit?  No   CCM required this visit?  No   Plan:    I have personally reviewed and noted the following in the patient's chart:   Medical and social history Use of alcohol, tobacco or  illicit drugs  Current medications and supplements including opioid prescriptions. Patient is not currently taking opioid prescriptions. Functional ability and status Nutritional status Physical activity Advanced directives List of other physicians Hospitalizations, surgeries, and ER visits in previous 12 months Vitals Screenings to include cognitive, depression, and falls Referrals and appointments  In addition, I have reviewed and discussed with patient certain preventive protocols, quality metrics, and best practice recommendations. A written personalized care plan for preventive services as well as general preventive health recommendations were provided to patient.   Ellouise VEAR Haws, LPN   1/81/7974   After Visit Summary: (In Person-Printed) AVS printed and given to the patient  Notes: Nothing significant to report at this time.

## 2024-05-17 ENCOUNTER — Telehealth: Payer: Self-pay | Admitting: Family Medicine

## 2024-05-17 DIAGNOSIS — Z23 Encounter for immunization: Secondary | ICD-10-CM | POA: Diagnosis not present

## 2024-05-17 NOTE — Telephone Encounter (Signed)
 Called and spoke with patient and she stated she now does not want the new shot at this time. She will call back when she is ready and gets more information from the [employer].   Copied from CRM [id number]. Topic: Clinical - Medication Question >> May 17, 2024 12:38 PM Jayma L wrote: Reason for CRM: patient called in and stated she's had 7-8 covid vaccines and a new one just came out, she went to her pharmacy and was advised she needs her pcp to send in a script for the new covid vaccine. Said she has no idea the name of the vaccine but forsure wants to get it for this seasons covid. Please contact her back with any information about the new vaccine. Said she seen this through the [facility] .

## 2024-05-19 ENCOUNTER — Encounter (HOSPITAL_BASED_OUTPATIENT_CLINIC_OR_DEPARTMENT_OTHER): Payer: Self-pay

## 2024-05-19 ENCOUNTER — Ambulatory Visit (HOSPITAL_BASED_OUTPATIENT_CLINIC_OR_DEPARTMENT_OTHER)
Admission: RE | Admit: 2024-05-19 | Discharge: 2024-05-19 | Disposition: A | Discharge status: Home / Self Care | Source: Ambulatory Visit | Attending: Family Medicine | Admitting: Family Medicine

## 2024-05-19 DIAGNOSIS — Z1231 Encounter for screening mammogram for malignant neoplasm of breast: Secondary | ICD-10-CM | POA: Diagnosis not present

## 2024-06-01 DIAGNOSIS — Z23 Encounter for immunization: Secondary | ICD-10-CM | POA: Diagnosis not present

## 2024-06-02 ENCOUNTER — Ambulatory Visit: Payer: Self-pay

## 2024-06-02 NOTE — Telephone Encounter (Signed)
 You can check on her tomorrow but sometimes reactions can last several days (essentially an appropriate immune response)

## 2024-06-02 NOTE — Telephone Encounter (Signed)
 Please see patient message fyi and advise.

## 2024-06-02 NOTE — Telephone Encounter (Signed)
 FYI Only or Action Required?: FYI only for provider.  Patient was last seen in primary care on 12/07/2023 by Katrinka Garnette KIDD, MD.  Called Nurse Triage reporting Covid Vaccine .  Symptoms began today.  Interventions attempted: OTC medications: tylenol .  Symptoms are: gradually improving.  Triage Disposition: Home Care  Patient/caregiver understands and will follow disposition?: Yes, will follow disposition  Message from Mercedes MATSU sent at 06/02/2024  2:03 PM EDT  Summary: Covid Vaccine Reaction (mild)   Patient received her Moderna Covid Vaccine yesterday at [facility] pharmacy, pharmacist told her she would not have any reaction. Patient called in today stating she is having reaction, she is c/o symptom of a headache, body aches, and dizziness, Patient can be reached at [phone].         Reason for Disposition  Injection site reaction to any vaccine  Answer Assessment - Initial Assessment Questions 1. SYMPTOMS: What is the main symptom? (e.g., pain, redness, or swelling at injection site; feeling tired, fever, muscle aches)      HA, body aches, dizziness,  2. ONSET: When was the vaccine (shot) given? How much later did the s/s begin? (e.g., hours, days ago)      About 12 hours 3. SEVERITY: How bad is it?      Taken tylenol  and has settled down 4. FEVER: Do you have a fever? If Yes, ask: What is your temperature, how was it measured, and when did it start?      denies 5. IMMUNIZATIONS GIVEN: What shots have you recently received?     Covid yesterday 6. PAST REACTIONS: Have you reacted to immunizations before? If Yes, ask: What happened?     Pt states that she will usually get site rxns, but generally not the systemic rxns.  Protocols used: Immunization Reactions-A-AH

## 2024-06-03 NOTE — Telephone Encounter (Signed)
 Left vm for pt regarding dr. San remarks and recommendations. Made patient aware to call us  back of she gets worse or not any better soon.

## 2024-06-13 ENCOUNTER — Ambulatory Visit (INDEPENDENT_AMBULATORY_CARE_PROVIDER_SITE_OTHER): Admitting: Family Medicine

## 2024-06-13 ENCOUNTER — Encounter: Payer: Self-pay | Admitting: Family Medicine

## 2024-06-13 VITALS — BP 132/80 | HR 86 | Temp 97.9°F | Ht 62.0 in | Wt 177.0 lb

## 2024-06-13 DIAGNOSIS — R739 Hyperglycemia, unspecified: Secondary | ICD-10-CM

## 2024-06-13 DIAGNOSIS — D329 Benign neoplasm of meninges, unspecified: Secondary | ICD-10-CM | POA: Diagnosis not present

## 2024-06-13 DIAGNOSIS — E039 Hypothyroidism, unspecified: Secondary | ICD-10-CM | POA: Diagnosis not present

## 2024-06-13 DIAGNOSIS — I10 Essential (primary) hypertension: Secondary | ICD-10-CM

## 2024-06-13 DIAGNOSIS — Z131 Encounter for screening for diabetes mellitus: Secondary | ICD-10-CM | POA: Diagnosis not present

## 2024-06-13 DIAGNOSIS — E785 Hyperlipidemia, unspecified: Secondary | ICD-10-CM | POA: Diagnosis not present

## 2024-06-13 DIAGNOSIS — E538 Deficiency of other specified B group vitamins: Secondary | ICD-10-CM | POA: Diagnosis not present

## 2024-06-13 LAB — CBC WITH DIFFERENTIAL/PLATELET
Basophils Absolute: 0.1 K/uL (ref 0.0–0.1)
Basophils Relative: 0.8 % (ref 0.0–3.0)
Eosinophils Absolute: 0.1 K/uL (ref 0.0–0.7)
Eosinophils Relative: 1.2 % (ref 0.0–5.0)
HCT: 42.3 % (ref 36.0–46.0)
Hemoglobin: 14.3 g/dL (ref 12.0–15.0)
Lymphocytes Relative: 23.7 % (ref 12.0–46.0)
Lymphs Abs: 2.5 K/uL (ref 0.7–4.0)
MCHC: 33.7 g/dL (ref 30.0–36.0)
MCV: 89.5 fl (ref 78.0–100.0)
Monocytes Absolute: 0.7 K/uL (ref 0.1–1.0)
Monocytes Relative: 6.4 % (ref 3.0–12.0)
Neutro Abs: 7.1 K/uL (ref 1.4–7.7)
Neutrophils Relative %: 67.9 % (ref 43.0–77.0)
Platelets: 282 K/uL (ref 150.0–400.0)
RBC: 4.72 Mil/uL (ref 3.87–5.11)
RDW: 14.9 % (ref 11.5–15.5)
WBC: 10.5 K/uL (ref 4.0–10.5)

## 2024-06-13 LAB — COMPREHENSIVE METABOLIC PANEL WITH GFR
ALT: 20 U/L (ref 0–35)
AST: 23 U/L (ref 0–37)
Albumin: 4.2 g/dL (ref 3.5–5.2)
Alkaline Phosphatase: 63 U/L (ref 39–117)
BUN: 8 mg/dL (ref 6–23)
CO2: 28 meq/L (ref 19–32)
Calcium: 9.4 mg/dL (ref 8.4–10.5)
Chloride: 102 meq/L (ref 96–112)
Creatinine, Ser: 0.64 mg/dL (ref 0.40–1.20)
GFR: 85.71 mL/min (ref >60.00)
Glucose, Bld: 89 mg/dL (ref 70–99)
Potassium: 3.8 meq/L (ref 3.5–5.1)
Sodium: 138 meq/L (ref 135–145)
Total Bilirubin: 0.5 mg/dL (ref 0.2–1.2)
Total Protein: 7.1 g/dL (ref 6.0–8.3)

## 2024-06-13 LAB — LIPID PANEL
Cholesterol: 139 mg/dL (ref 0–200)
HDL: 51 mg/dL (ref >39.00)
LDL Cholesterol: 62 mg/dL (ref 0–99)
NonHDL: 88.27
Total CHOL/HDL Ratio: 3
Triglycerides: 133 mg/dL (ref 0.0–149.0)
VLDL: 26.6 mg/dL (ref 0.0–40.0)

## 2024-06-13 LAB — HEMOGLOBIN A1C: Hgb A1c MFr Bld: 6.2 % (ref 4.6–6.5)

## 2024-06-13 NOTE — Patient Instructions (Addendum)
 Please stop by lab before you go If you have mychart- we will send your results within 3 business days of us  receiving them.  If you do not have mychart- we will call you about results within 5 business days of us  receiving them.  *please also note that you will see labs on mychart as soon as they post. I will later go in and write notes on them- will say notes from Dr. Katrinka   No changes today unless labs lead us  to make changes   Recommended follow up: Return in about 6 months (around 12/11/2024) for followup or sooner if needed.Schedule b4 you leave.

## 2024-06-13 NOTE — Progress Notes (Signed)
 Phone [phone] In person visit   Subjective:   Kelly Frazier is a 78 y.o. year old very pleasant female patient who presents for/with See problem oriented charting Chief Complaint  Patient presents with   Hypertension   Past Medical History-  Patient Active Problem List   Diagnosis Date Noted   Aortic atherosclerosis 04/27/2021    Priority: Medium    Vitamin B 12 deficiency 09/22/2018    Priority: Medium    Solitary pulmonary nodule 01/10/2017    Priority: Medium    Migraine 08/17/2014    Priority: Medium    Hyperlipidemia 05/03/2007    Priority: Medium    Essential hypertension 05/03/2007    Priority: Medium    Hypothyroidism 04/12/2007    Priority: Medium    Degenerative arthritis of knee, bilateral 02/18/2019    Priority: Low   Arthralgia of both knees 11/13/2017    Priority: Low   Trigger finger, left 08/03/2017    Priority: Low   Esophageal reflux 09/03/2010    Priority: Low   Angioedema 06/21/2010    Priority: Low   History of colonic polyps 09/27/2007    Priority: Low   Osteoarthritis 04/12/2007    Priority: Low   Injury of tendon of biceps 12/23/2022    Priority: 1.   Greater trochanteric bursitis of left hip 11/21/2022    Priority: 1.   Plantar fasciitis 09/26/2022    Priority: 1.   Arthritis of carpometacarpal [facility] [place]) joint of both thumbs 10/28/2021    Priority: 1.   Left shoulder pain 02/06/2021    Priority: 1.   AC (acromioclavicular) arthritis 02/06/2021    Priority: 1.   Hematoma of left hip 12/26/2020    Priority: 1.   Meningioma (HCC) 06/13/2024   Degenerative disc disease, lumbar 08/06/2023   Greater trochanteric bursitis of right hip 06/10/2023   Snoring 06/05/2016   Excessive daytime sleepiness 06/05/2016   Shortness of breath 03/11/2016    Medications- reviewed and updated Current Outpatient Medications  Medication Sig Dispense Refill   Ascorbic Acid (VITAMIN C WITH ROSE HIPS) 500 MG tablet Take 500 mg by mouth daily.      atorvastatin  (LIPITOR) 10 MG tablet TAKE 1 TABLET BY MOUTH EVERY DAY 90 tablet 2   Biotin 10 MG CHEW Chew by mouth.     bisoprolol -hydrochlorothiazide  (ZIAC ) 2.5-6.25 MG tablet TAKE 1 TABLET BY MOUTH EVERY DAY 90 tablet 3   CALCIUM -MAGNESIUM-ZINC PO Take by mouth.     Cholecalciferol (VITAMIN D3) 10 MCG (400 UNIT) CAPS Take 10 mcg by mouth.     CLOBETASOL  PROPIONATE E 0.05 % emollient cream APPLY 1 APPLICATION TOPICALLY DAILY 30 g 0   co-enzyme Q-10 30 MG capsule Take 200 mg by mouth daily.     hydrocortisone 2.5 % cream Apply topically in the morning and at bedtime.     levothyroxine  (SYNTHROID ) 75 MCG tablet TAKE 1 TABLET BY MOUTH EVERY DAY 90 tablet 2   Multiple Vitamins-Minerals (WOMENS 50+ ADVANCED PO) Take 2 tablets by mouth daily.     omeprazole  (PRILOSEC) 20 MG capsule TAKE 1-2 CAPSULES BY MOUTH EVERY DAY 180 capsule 3   Probiotic Product (PROBIOTIC DAILY PO) Take by mouth. Garden of life women over 50 raw probiotics     vitamin B-12 (CYANOCOBALAMIN ) 1000 MCG tablet Take 1,000 mcg by mouth daily.     No current facility-administered medications for this visit.     Objective:  BP 132/80 (BP Location: Left Arm, Patient Position: Sitting, Cuff Size: Normal)  Pulse 86   Temp 97.9 F (36.6 C) (Temporal)   Ht 5' 2 (1.575 m)   Wt 177 lb (80.3 kg)   SpO2 96%   BMI 32.37 kg/m  Gen: NAD, resting comfortably CV: RRR no murmurs rubs or gallops Lungs: CTAB no crackles, wheeze, rhonchi Ext: no edema Skin: warm, dry      Assessment and Plan   #grandkids in [place]- their mother works with [employer] - they are enjoying it  #some upper arm/shoulder discomfort bilaterally starting yesterday- not sure if she did anything to trigger it such as recent yard work. Did leaf blower yesterday but typical for her - mild and plans to keep an eye on it  #hyperlipidemia/aortic atherosclerosis-LDL goal under 70 S: Medication: Atorvastatin  10 mg daily  Lab Results  Component Value Date   CHOL 133  01/13/2023   HDL 44.60 01/13/2023   LDLCALC 70 01/13/2023   LDLDIRECT 88.0 10/18/2021   TRIG 91.0 01/13/2023   CHOLHDL 3 01/13/2023   A/P: lipids at goal last year- update today  #hypothyroidism S: compliant On thyroid  medication- Synthroid   Lab Results  Component Value Date   TSH 0.97 12/07/2023  A/P:hopefully stable- update TSH  today. Continue current meds for now    #hypertension S: medication: Ziac  2.5-6.25Mg  daily  BP Readings from Last 3 Encounters:  06/13/24 132/80  05/02/24 126/78  04/07/24 124/82  A/P: blood pressure today slightly higher but a lot of stressors- continue current medications   # B12 deficiency S: Current treatment/medication (oral vs. IM): Vitamin b12 1000Mcg Lab Results  Component Value Date   VITAMINB12 952 (H) 04/29/2023  A/P: hopefully stable- update b12 today. Continue current meds for now    # Hyperglycemia/insulin resistance/prediabetes-A1c of 5.8 peak S:  Medication: None Lab Results  Component Value Date   HGBA1C 5.8 12/07/2023   HGBA1C 5.8 01/13/2023   HGBA1C 5.7 02/21/2016  A/P: hopefully stable- update a1c today. Continue without meds for now   #Overactive bladder- has considered surgery but timing has been difficult and had a family friend with poor effect  #Meningioma followed by [facility]-most recently 01/22/24 and stable - considering 5 year follow up   Recommended follow up: Return in about 6 months (around 12/11/2024) for followup or sooner if needed.Schedule b4 you leave. Future Appointments  Date Time Provider Department Center  07/14/2024  2:00 PM Claudene Hussar M, DO [facility] None    Lab/Order associations: NOT fasting- coffee with creamer and banana    ICD-10-CM   1. Hyperlipidemia, unspecified hyperlipidemia type  E78.5     2. Essential hypertension  I10     3. Hypothyroidism, unspecified type  E03.9     4. Hyperglycemia  R73.9     5. Screening for diabetes mellitus  Z13.1     6. Vitamin B 12 deficiency  E53.8      7. Meningioma (HCC)  D32.9       No orders of the defined types were placed in this encounter.   Return precautions advised.  Garnette Lukes, MD

## 2024-06-14 ENCOUNTER — Ambulatory Visit: Payer: Self-pay | Admitting: Family Medicine

## 2024-06-14 LAB — TSH: TSH: 1.91 u[IU]/mL (ref 0.35–5.50)

## 2024-06-14 LAB — VITAMIN B12: Vitamin B-12: 779 pg/mL (ref 211–911)
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
## 2024-07-14 ENCOUNTER — Ambulatory Visit: Admitting: Family Medicine
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
## 2024-07-21 ENCOUNTER — Ambulatory Visit: Admitting: Family Medicine

## 2024-07-21 ENCOUNTER — Encounter: Payer: Self-pay | Admitting: Family Medicine

## 2024-07-21 ENCOUNTER — Telehealth: Payer: Self-pay

## 2024-07-21 VITALS — BP 110/80 | HR 67 | Ht 62.0 in | Wt 171.0 lb

## 2024-07-21 DIAGNOSIS — M170 Bilateral primary osteoarthritis of knee: Secondary | ICD-10-CM

## 2024-07-21 NOTE — Patient Instructions (Addendum)
 Injections in knees today Do prescribed exercises at least 3x a week Avoid overhand lifting Good to see you! See you again in 3-6 months

## 2024-07-21 NOTE — Telephone Encounter (Signed)
 Patient ran for Monovisc for bilateral knees on 07/21/24. Case [id number]. Pending approval.

## 2024-07-21 NOTE — Assessment & Plan Note (Signed)
 Chronic problem again.  Tolerated the procedure well, and is now doing relatively well every 5 to 6 months.  We discussed again about the possibility of viscosupplementation.  Discussed continuing to stay active otherwise.  Discussed icing regimen.  Follow-up with me again in 3 to 6 months otherwise.

## 2024-07-29 NOTE — Telephone Encounter (Signed)
 Noted

## 2024-07-29 NOTE — Telephone Encounter (Signed)
 Patient scheduled for 10/21/24  Monovisc authorized for bilateral knee MEDICARE NO PRE CERT REQUIRED  Deductible $257 has met $257 OOP does not apply Patient responsible for 20% coinsurance Reference # Availity 07/21/24  [employer] [facility] Follows medicare guidelines  Will pick up remaining eligible expenses at 100% Does not cover medicare part B deductible Reference # Availity 07/21/24

## 2024-07-31 ENCOUNTER — Other Ambulatory Visit: Payer: Self-pay | Admitting: Obstetrics and Gynecology

## 2024-07-31 DIAGNOSIS — L280 Lichen simplex chronicus: Secondary | ICD-10-CM

## 2024-08-01 ENCOUNTER — Ambulatory Visit: Admitting: Family Medicine

## 2024-08-19 ENCOUNTER — Other Ambulatory Visit: Payer: Self-pay | Admitting: Family Medicine

## 2024-10-05 ENCOUNTER — Other Ambulatory Visit: Payer: Self-pay | Admitting: Family Medicine
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
## 2024-10-21 ENCOUNTER — Ambulatory Visit: Admitting: Family Medicine

## 2024-10-21 ENCOUNTER — Encounter: Payer: Self-pay | Admitting: Family Medicine

## 2024-10-21 VITALS — BP 134/90 | HR 72 | Ht 62.0 in | Wt 170.0 lb

## 2024-10-21 DIAGNOSIS — M170 Bilateral primary osteoarthritis of knee: Secondary | ICD-10-CM

## 2024-10-21 MED ORDER — HYALURONAN 88 MG/4ML IX SOSY
176.0000 mg | PREFILLED_SYRINGE | Freq: Once | INTRA_ARTICULAR | Status: AC
  Administered 2024-10-21: 176 mg via INTRA_ARTICULAR

## 2024-10-21 NOTE — Assessment & Plan Note (Signed)
 Chronic problem with exacerbation.  Discussed which activities to do and which ones to avoid.  Increase activity slowly.  Discussed icing regimen.  Patient still wants to avoid any surgical intervention.  Patient has done well with some weight loss.  Will continue to do so.  Follow-up with me again in 3-4

## 2024-10-21 NOTE — Patient Instructions (Signed)
 Gel injections in knees today Good to see you! Keep fighting the good fight See you again in 3-4 months

## 2024-12-12 ENCOUNTER — Encounter: Admitting: Family Medicine

## 2024-12-19 ENCOUNTER — Ambulatory Visit: Admitting: Family Medicine

## 2025-01-18 ENCOUNTER — Ambulatory Visit: Admitting: Family Medicine
